# Patient Record
Sex: Female | Born: 1960
Health system: Southern US, Community
[De-identification: ages and names within clinical notes are randomized; demographics above are authoritative.]

## PROBLEM LIST (undated history)

## (undated) ENCOUNTER — Emergency Department (HOSPITAL_BASED_OUTPATIENT_CLINIC_OR_DEPARTMENT_OTHER): Admission: EM | Payer: Medicare Other

## (undated) DIAGNOSIS — F32A Depression, unspecified: Secondary | ICD-10-CM

## (undated) DIAGNOSIS — IMO0002 Reserved for concepts with insufficient information to code with codable children: Secondary | ICD-10-CM

## (undated) DIAGNOSIS — F419 Anxiety disorder, unspecified: Secondary | ICD-10-CM

## (undated) DIAGNOSIS — T4145XA Adverse effect of unspecified anesthetic, initial encounter: Secondary | ICD-10-CM

## (undated) DIAGNOSIS — I493 Ventricular premature depolarization: Secondary | ICD-10-CM

## (undated) DIAGNOSIS — E785 Hyperlipidemia, unspecified: Secondary | ICD-10-CM

## (undated) DIAGNOSIS — M797 Fibromyalgia: Secondary | ICD-10-CM

## (undated) DIAGNOSIS — G43709 Chronic migraine without aura, not intractable, without status migrainosus: Secondary | ICD-10-CM

## (undated) DIAGNOSIS — F329 Major depressive disorder, single episode, unspecified: Secondary | ICD-10-CM

## (undated) DIAGNOSIS — K559 Vascular disorder of intestine, unspecified: Secondary | ICD-10-CM

## (undated) DIAGNOSIS — T8859XA Other complications of anesthesia, initial encounter: Secondary | ICD-10-CM

## (undated) DIAGNOSIS — I10 Essential (primary) hypertension: Secondary | ICD-10-CM

## (undated) DIAGNOSIS — H04129 Dry eye syndrome of unspecified lacrimal gland: Secondary | ICD-10-CM

## (undated) DIAGNOSIS — E039 Hypothyroidism, unspecified: Secondary | ICD-10-CM

## (undated) DIAGNOSIS — F319 Bipolar disorder, unspecified: Secondary | ICD-10-CM

## (undated) DIAGNOSIS — R682 Dry mouth, unspecified: Secondary | ICD-10-CM

## (undated) DIAGNOSIS — Z8489 Family history of other specified conditions: Secondary | ICD-10-CM

## (undated) DIAGNOSIS — K589 Irritable bowel syndrome without diarrhea: Secondary | ICD-10-CM

## (undated) DIAGNOSIS — K219 Gastro-esophageal reflux disease without esophagitis: Secondary | ICD-10-CM

## (undated) DIAGNOSIS — K5790 Diverticulosis of intestine, part unspecified, without perforation or abscess without bleeding: Secondary | ICD-10-CM

## (undated) HISTORY — DX: Depression, unspecified: F32.A

## (undated) HISTORY — PX: ABDOMINAL HYSTERECTOMY: SHX81

## (undated) HISTORY — PX: APPENDECTOMY: SHX54

## (undated) HISTORY — DX: Essential (primary) hypertension: I10

## (undated) HISTORY — DX: Gastro-esophageal reflux disease without esophagitis: K21.9

## (undated) HISTORY — DX: Ventricular premature depolarization: I49.3

## (undated) HISTORY — DX: Fibromyalgia: M79.7

## (undated) HISTORY — DX: Irritable bowel syndrome, unspecified: K58.9

## (undated) HISTORY — PX: TONSILLECTOMY: SUR1361

## (undated) HISTORY — PX: BACK SURGERY: SHX140

## (undated) HISTORY — DX: Dry eye syndrome of unspecified lacrimal gland: H04.129

## (undated) HISTORY — DX: Anxiety disorder, unspecified: F41.9

## (undated) HISTORY — DX: Reserved for concepts with insufficient information to code with codable children: IMO0002

## (undated) HISTORY — DX: Major depressive disorder, single episode, unspecified: F32.9

## (undated) HISTORY — PX: CHOLECYSTECTOMY: SHX55

## (undated) HISTORY — DX: Hyperlipidemia, unspecified: E78.5

## (undated) HISTORY — DX: Vascular disorder of intestine, unspecified: K55.9

## (undated) HISTORY — DX: Dry mouth, unspecified: R68.2

## (undated) HISTORY — DX: Bipolar disorder, unspecified: F31.9

## (undated) HISTORY — DX: Diverticulosis of intestine, part unspecified, without perforation or abscess without bleeding: K57.90

## (undated) HISTORY — DX: Chronic migraine without aura, not intractable, without status migrainosus: G43.709

## (undated) HISTORY — DX: Hypothyroidism, unspecified: E03.9

---
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
## 2000-08-26 ENCOUNTER — Other Ambulatory Visit: Admission: RE | Admit: 2000-08-26 | Discharge: 2000-08-26 | Payer: Self-pay | Admitting: Obstetrics and Gynecology

## 2001-06-26 ENCOUNTER — Encounter: Payer: Self-pay | Admitting: Neurosurgery

## 2001-06-26 ENCOUNTER — Ambulatory Visit (HOSPITAL_COMMUNITY): Admission: RE | Admit: 2001-06-26 | Discharge: 2001-06-26 | Payer: Self-pay | Admitting: Neurosurgery

## 2001-07-15 ENCOUNTER — Encounter: Payer: Self-pay | Admitting: Neurosurgery

## 2001-07-17 ENCOUNTER — Encounter: Payer: Self-pay | Admitting: Neurosurgery

## 2001-07-17 ENCOUNTER — Ambulatory Visit (HOSPITAL_COMMUNITY): Admission: RE | Admit: 2001-07-17 | Discharge: 2001-07-17 | Payer: Self-pay | Admitting: Neurosurgery

## 2004-04-20 ENCOUNTER — Ambulatory Visit: Payer: Self-pay | Admitting: Family Medicine

## 2004-05-16 ENCOUNTER — Ambulatory Visit: Payer: Self-pay | Admitting: Family Medicine

## 2004-06-02 ENCOUNTER — Ambulatory Visit: Payer: Self-pay | Admitting: Family Medicine

## 2004-07-14 ENCOUNTER — Ambulatory Visit: Payer: Self-pay | Admitting: Family Medicine

## 2004-07-15 ENCOUNTER — Encounter: Admission: RE | Admit: 2004-07-15 | Discharge: 2004-07-15 | Payer: Self-pay | Admitting: Rheumatology

## 2004-07-21 ENCOUNTER — Ambulatory Visit: Payer: Self-pay | Admitting: Family Medicine

## 2004-08-21 ENCOUNTER — Ambulatory Visit: Payer: Self-pay | Admitting: Family Medicine

## 2004-10-02 ENCOUNTER — Ambulatory Visit: Payer: Self-pay | Admitting: Family Medicine

## 2004-10-12 ENCOUNTER — Ambulatory Visit: Payer: Self-pay | Admitting: Family Medicine

## 2004-10-12 ENCOUNTER — Encounter: Admission: RE | Admit: 2004-10-12 | Discharge: 2004-10-12 | Payer: Self-pay | Admitting: Family Medicine

## 2004-11-03 ENCOUNTER — Ambulatory Visit: Payer: Self-pay | Admitting: Internal Medicine

## 2004-11-07 ENCOUNTER — Ambulatory Visit: Payer: Self-pay | Admitting: Family Medicine

## 2004-12-20 ENCOUNTER — Ambulatory Visit: Payer: Self-pay

## 2004-12-25 ENCOUNTER — Other Ambulatory Visit: Admission: RE | Admit: 2004-12-25 | Discharge: 2004-12-25 | Payer: Self-pay | Admitting: Obstetrics and Gynecology

## 2005-01-04 ENCOUNTER — Encounter: Admission: RE | Admit: 2005-01-04 | Discharge: 2005-01-04 | Payer: Self-pay | Admitting: Obstetrics and Gynecology

## 2005-01-29 ENCOUNTER — Ambulatory Visit: Payer: Self-pay | Admitting: Family Medicine

## 2005-02-15 ENCOUNTER — Ambulatory Visit: Payer: Self-pay | Admitting: Family Medicine

## 2005-02-20 ENCOUNTER — Ambulatory Visit: Payer: Self-pay | Admitting: Family Medicine

## 2005-03-30 ENCOUNTER — Ambulatory Visit: Payer: Self-pay | Admitting: Family Medicine

## 2005-04-17 ENCOUNTER — Encounter: Admission: RE | Admit: 2005-04-17 | Discharge: 2005-04-17 | Payer: Self-pay | Admitting: Family Medicine

## 2005-05-09 ENCOUNTER — Ambulatory Visit: Payer: Self-pay | Admitting: Family Medicine

## 2005-05-09 ENCOUNTER — Encounter: Admission: RE | Admit: 2005-05-09 | Discharge: 2005-05-09 | Payer: Self-pay | Admitting: Family Medicine

## 2005-06-20 ENCOUNTER — Ambulatory Visit: Payer: Self-pay | Admitting: Family Medicine

## 2005-08-06 ENCOUNTER — Encounter: Admission: RE | Admit: 2005-08-06 | Discharge: 2005-08-06 | Payer: Self-pay | Admitting: Obstetrics and Gynecology

## 2005-09-13 ENCOUNTER — Ambulatory Visit: Payer: Self-pay | Admitting: Family Medicine

## 2005-10-16 ENCOUNTER — Ambulatory Visit: Payer: Self-pay | Admitting: Family Medicine

## 2005-10-17 ENCOUNTER — Ambulatory Visit: Payer: Self-pay | Admitting: Cardiology

## 2005-10-17 ENCOUNTER — Encounter (INDEPENDENT_AMBULATORY_CARE_PROVIDER_SITE_OTHER): Payer: Self-pay | Admitting: *Deleted

## 2005-10-22 ENCOUNTER — Ambulatory Visit: Payer: Self-pay | Admitting: Internal Medicine

## 2005-10-23 ENCOUNTER — Ambulatory Visit: Payer: Self-pay | Admitting: Internal Medicine

## 2005-10-23 ENCOUNTER — Encounter (INDEPENDENT_AMBULATORY_CARE_PROVIDER_SITE_OTHER): Payer: Self-pay | Admitting: *Deleted

## 2005-10-23 ENCOUNTER — Inpatient Hospital Stay (HOSPITAL_COMMUNITY): Admission: EM | Admit: 2005-10-23 | Discharge: 2005-10-24 | Payer: Self-pay | Admitting: Emergency Medicine

## 2005-10-25 ENCOUNTER — Ambulatory Visit: Payer: Self-pay | Admitting: Internal Medicine

## 2005-10-31 ENCOUNTER — Ambulatory Visit (HOSPITAL_COMMUNITY): Admission: RE | Admit: 2005-10-31 | Discharge: 2005-10-31 | Payer: Self-pay | Admitting: Internal Medicine

## 2005-10-31 ENCOUNTER — Encounter: Payer: Self-pay | Admitting: Internal Medicine

## 2005-11-21 ENCOUNTER — Ambulatory Visit: Payer: Self-pay | Admitting: Family Medicine

## 2005-12-10 ENCOUNTER — Ambulatory Visit: Payer: Self-pay | Admitting: Internal Medicine

## 2005-12-26 ENCOUNTER — Ambulatory Visit: Payer: Self-pay | Admitting: Family Medicine

## 2006-04-22 ENCOUNTER — Encounter: Admission: RE | Admit: 2006-04-22 | Discharge: 2006-04-22 | Payer: Self-pay | Admitting: Obstetrics and Gynecology

## 2006-04-30 ENCOUNTER — Ambulatory Visit: Payer: Self-pay | Admitting: Family Medicine

## 2006-05-10 ENCOUNTER — Ambulatory Visit: Payer: Self-pay | Admitting: Family Medicine

## 2006-07-29 DIAGNOSIS — Z87442 Personal history of urinary calculi: Secondary | ICD-10-CM

## 2006-07-29 DIAGNOSIS — F329 Major depressive disorder, single episode, unspecified: Secondary | ICD-10-CM

## 2006-07-29 DIAGNOSIS — L719 Rosacea, unspecified: Secondary | ICD-10-CM | POA: Insufficient documentation

## 2006-07-29 DIAGNOSIS — Z8719 Personal history of other diseases of the digestive system: Secondary | ICD-10-CM | POA: Insufficient documentation

## 2006-07-29 DIAGNOSIS — IMO0001 Reserved for inherently not codable concepts without codable children: Secondary | ICD-10-CM

## 2006-07-29 DIAGNOSIS — R519 Headache, unspecified: Secondary | ICD-10-CM | POA: Insufficient documentation

## 2006-07-29 DIAGNOSIS — E039 Hypothyroidism, unspecified: Secondary | ICD-10-CM | POA: Insufficient documentation

## 2006-07-29 DIAGNOSIS — F3160 Bipolar disorder, current episode mixed, unspecified: Secondary | ICD-10-CM | POA: Insufficient documentation

## 2006-07-29 DIAGNOSIS — R51 Headache: Secondary | ICD-10-CM

## 2006-11-08 ENCOUNTER — Ambulatory Visit: Payer: Self-pay | Admitting: Family Medicine

## 2006-11-08 LAB — CONVERTED CEMR LAB
AST: 40 units/L — ABNORMAL HIGH (ref 0–37)
Basophils Absolute: 0 10*3/uL (ref 0.0–0.1)
Bilirubin, Direct: 0.1 mg/dL (ref 0.0–0.3)
Chloride: 101 meq/L (ref 96–112)
Direct LDL: 179 mg/dL
Eosinophils Absolute: 0.2 10*3/uL (ref 0.0–0.6)
Eosinophils Relative: 2.8 % (ref 0.0–5.0)
GFR calc non Af Amer: 141 mL/min
Glucose, Bld: 92 mg/dL (ref 70–99)
HCT: 39.6 % (ref 36.0–46.0)
Hemoglobin: 13.9 g/dL (ref 12.0–15.0)
Lymphocytes Relative: 32.3 % (ref 12.0–46.0)
MCHC: 35.1 g/dL (ref 30.0–36.0)
MCV: 85.1 fL (ref 78.0–100.0)
Monocytes Absolute: 0.4 10*3/uL (ref 0.2–0.7)
Neutro Abs: 4 10*3/uL (ref 1.4–7.7)
Neutrophils Relative %: 58.5 % (ref 43.0–77.0)
Potassium: 3.4 meq/L — ABNORMAL LOW (ref 3.5–5.1)
Sodium: 142 meq/L (ref 135–145)
TSH: 1.08 microintl units/mL (ref 0.35–5.50)
WBC: 6.8 10*3/uL (ref 4.5–10.5)

## 2006-11-15 ENCOUNTER — Encounter: Payer: Self-pay | Admitting: Family Medicine

## 2006-11-15 ENCOUNTER — Ambulatory Visit: Payer: Self-pay | Admitting: Internal Medicine

## 2007-01-17 ENCOUNTER — Telehealth (INDEPENDENT_AMBULATORY_CARE_PROVIDER_SITE_OTHER): Payer: Self-pay | Admitting: *Deleted

## 2007-01-20 ENCOUNTER — Ambulatory Visit: Payer: Self-pay | Admitting: Family Medicine

## 2007-02-03 ENCOUNTER — Ambulatory Visit: Payer: Self-pay | Admitting: Family Medicine

## 2007-02-07 LAB — CONVERTED CEMR LAB
BUN: 11 mg/dL
CO2: 29 meq/L
Calcium: 9.5 mg/dL
Chloride: 103 meq/L
Creatinine, Ser: 0.6 mg/dL
GFR calc Af Amer: 138 mL/min
GFR calc non Af Amer: 114 mL/min
Glucose, Bld: 106 mg/dL — ABNORMAL HIGH
Potassium: 3.8 meq/L
Sodium: 140 meq/L

## 2007-02-13 ENCOUNTER — Telehealth (INDEPENDENT_AMBULATORY_CARE_PROVIDER_SITE_OTHER): Payer: Self-pay | Admitting: *Deleted

## 2007-04-18 ENCOUNTER — Encounter: Payer: Self-pay | Admitting: Family Medicine

## 2007-04-25 ENCOUNTER — Encounter: Admission: RE | Admit: 2007-04-25 | Discharge: 2007-04-25 | Payer: Self-pay | Admitting: Obstetrics and Gynecology

## 2007-05-30 ENCOUNTER — Ambulatory Visit: Payer: Self-pay | Admitting: Internal Medicine

## 2007-05-30 DIAGNOSIS — I10 Essential (primary) hypertension: Secondary | ICD-10-CM | POA: Insufficient documentation

## 2007-07-08 ENCOUNTER — Ambulatory Visit: Payer: Self-pay | Admitting: Family Medicine

## 2007-07-08 DIAGNOSIS — R011 Cardiac murmur, unspecified: Secondary | ICD-10-CM

## 2007-07-21 ENCOUNTER — Ambulatory Visit: Payer: Self-pay

## 2007-07-21 ENCOUNTER — Encounter: Payer: Self-pay | Admitting: Family Medicine

## 2007-07-22 ENCOUNTER — Telehealth (INDEPENDENT_AMBULATORY_CARE_PROVIDER_SITE_OTHER): Payer: Self-pay | Admitting: *Deleted

## 2007-08-15 ENCOUNTER — Ambulatory Visit: Payer: Self-pay | Admitting: Family Medicine

## 2007-08-15 LAB — CONVERTED CEMR LAB
BUN: 11 mg/dL (ref 6–23)
Chloride: 104 meq/L (ref 96–112)
GFR calc non Af Amer: 113 mL/min
Hgb A1c MFr Bld: 5.4 % (ref 4.6–6.0)
Potassium: 4.4 meq/L (ref 3.5–5.1)
Sodium: 140 meq/L (ref 135–145)

## 2007-09-02 ENCOUNTER — Encounter: Payer: Self-pay | Admitting: Family Medicine

## 2007-09-29 ENCOUNTER — Telehealth (INDEPENDENT_AMBULATORY_CARE_PROVIDER_SITE_OTHER): Payer: Self-pay | Admitting: *Deleted

## 2007-12-31 ENCOUNTER — Telehealth: Payer: Self-pay | Admitting: Internal Medicine

## 2008-02-06 ENCOUNTER — Ambulatory Visit: Payer: Self-pay | Admitting: Family Medicine

## 2008-02-06 DIAGNOSIS — M545 Low back pain: Secondary | ICD-10-CM

## 2008-02-10 DIAGNOSIS — K5730 Diverticulosis of large intestine without perforation or abscess without bleeding: Secondary | ICD-10-CM | POA: Insufficient documentation

## 2008-02-10 DIAGNOSIS — N809 Endometriosis, unspecified: Secondary | ICD-10-CM | POA: Insufficient documentation

## 2008-02-10 DIAGNOSIS — I4949 Other premature depolarization: Secondary | ICD-10-CM | POA: Insufficient documentation

## 2008-02-10 DIAGNOSIS — K219 Gastro-esophageal reflux disease without esophagitis: Secondary | ICD-10-CM | POA: Insufficient documentation

## 2008-02-11 ENCOUNTER — Ambulatory Visit: Payer: Self-pay | Admitting: Internal Medicine

## 2008-02-14 ENCOUNTER — Encounter: Admission: RE | Admit: 2008-02-14 | Discharge: 2008-02-14 | Payer: Self-pay | Admitting: Family Medicine

## 2008-02-18 ENCOUNTER — Telehealth: Payer: Self-pay | Admitting: Family Medicine

## 2008-02-18 ENCOUNTER — Telehealth (INDEPENDENT_AMBULATORY_CARE_PROVIDER_SITE_OTHER): Payer: Self-pay | Admitting: *Deleted

## 2008-02-18 ENCOUNTER — Encounter (INDEPENDENT_AMBULATORY_CARE_PROVIDER_SITE_OTHER): Payer: Self-pay | Admitting: *Deleted

## 2008-03-19 ENCOUNTER — Encounter (INDEPENDENT_AMBULATORY_CARE_PROVIDER_SITE_OTHER): Payer: Self-pay | Admitting: *Deleted

## 2008-03-31 ENCOUNTER — Encounter: Payer: Self-pay | Admitting: Family Medicine

## 2008-04-08 ENCOUNTER — Emergency Department (HOSPITAL_BASED_OUTPATIENT_CLINIC_OR_DEPARTMENT_OTHER): Admission: EM | Admit: 2008-04-08 | Discharge: 2008-04-08 | Payer: Self-pay | Admitting: Emergency Medicine

## 2008-05-21 ENCOUNTER — Encounter: Payer: Self-pay | Admitting: Family Medicine

## 2008-05-25 ENCOUNTER — Encounter: Admission: RE | Admit: 2008-05-25 | Discharge: 2008-05-25 | Payer: Self-pay | Admitting: Obstetrics and Gynecology

## 2008-06-03 ENCOUNTER — Encounter: Admission: RE | Admit: 2008-06-03 | Discharge: 2008-06-03 | Payer: Self-pay | Admitting: Obstetrics and Gynecology

## 2008-08-17 ENCOUNTER — Encounter: Payer: Self-pay | Admitting: Family Medicine

## 2008-08-23 ENCOUNTER — Telehealth (INDEPENDENT_AMBULATORY_CARE_PROVIDER_SITE_OTHER): Payer: Self-pay | Admitting: *Deleted

## 2008-12-27 ENCOUNTER — Ambulatory Visit: Payer: Self-pay | Admitting: Family Medicine

## 2008-12-27 DIAGNOSIS — J029 Acute pharyngitis, unspecified: Secondary | ICD-10-CM | POA: Insufficient documentation

## 2008-12-28 ENCOUNTER — Encounter: Payer: Self-pay | Admitting: Family Medicine

## 2009-01-03 ENCOUNTER — Telehealth (INDEPENDENT_AMBULATORY_CARE_PROVIDER_SITE_OTHER): Payer: Self-pay | Admitting: *Deleted

## 2009-02-07 ENCOUNTER — Ambulatory Visit: Payer: Self-pay | Admitting: Family Medicine

## 2009-02-07 DIAGNOSIS — D239 Other benign neoplasm of skin, unspecified: Secondary | ICD-10-CM | POA: Insufficient documentation

## 2009-02-16 ENCOUNTER — Telehealth: Payer: Self-pay | Admitting: Family Medicine

## 2009-02-17 ENCOUNTER — Encounter (INDEPENDENT_AMBULATORY_CARE_PROVIDER_SITE_OTHER): Payer: Self-pay | Admitting: *Deleted

## 2009-02-22 ENCOUNTER — Ambulatory Visit: Payer: Self-pay | Admitting: Internal Medicine

## 2009-02-22 ENCOUNTER — Encounter: Payer: Self-pay | Admitting: Family Medicine

## 2009-03-02 ENCOUNTER — Ambulatory Visit: Payer: Self-pay | Admitting: Internal Medicine

## 2009-03-15 ENCOUNTER — Encounter: Payer: Self-pay | Admitting: Family Medicine

## 2009-04-19 ENCOUNTER — Encounter: Payer: Self-pay | Admitting: Family Medicine

## 2009-04-23 ENCOUNTER — Ambulatory Visit: Payer: Self-pay | Admitting: Family Medicine

## 2009-04-23 DIAGNOSIS — N390 Urinary tract infection, site not specified: Secondary | ICD-10-CM | POA: Insufficient documentation

## 2009-04-23 LAB — CONVERTED CEMR LAB
Nitrite: NEGATIVE
Specific Gravity, Urine: 1.015
Urobilinogen, UA: 0.2
WBC Urine, dipstick: NEGATIVE

## 2009-05-23 ENCOUNTER — Emergency Department (HOSPITAL_BASED_OUTPATIENT_CLINIC_OR_DEPARTMENT_OTHER): Admission: EM | Admit: 2009-05-23 | Discharge: 2009-05-23 | Payer: Self-pay | Admitting: Emergency Medicine

## 2009-05-23 ENCOUNTER — Encounter (INDEPENDENT_AMBULATORY_CARE_PROVIDER_SITE_OTHER): Payer: Self-pay | Admitting: *Deleted

## 2009-05-23 ENCOUNTER — Ambulatory Visit: Payer: Self-pay | Admitting: Diagnostic Radiology

## 2009-05-24 ENCOUNTER — Telehealth: Payer: Self-pay | Admitting: Family Medicine

## 2009-05-25 ENCOUNTER — Encounter: Payer: Self-pay | Admitting: Family Medicine

## 2009-05-27 ENCOUNTER — Encounter: Admission: RE | Admit: 2009-05-27 | Discharge: 2009-05-27 | Payer: Self-pay | Admitting: Obstetrics and Gynecology

## 2009-07-04 ENCOUNTER — Telehealth: Payer: Self-pay | Admitting: Family Medicine

## 2009-07-07 ENCOUNTER — Ambulatory Visit: Payer: Self-pay | Admitting: Family Medicine

## 2009-07-07 DIAGNOSIS — G43909 Migraine, unspecified, not intractable, without status migrainosus: Secondary | ICD-10-CM | POA: Insufficient documentation

## 2009-07-07 DIAGNOSIS — G43009 Migraine without aura, not intractable, without status migrainosus: Secondary | ICD-10-CM | POA: Insufficient documentation

## 2009-07-25 ENCOUNTER — Telehealth (INDEPENDENT_AMBULATORY_CARE_PROVIDER_SITE_OTHER): Payer: Self-pay | Admitting: *Deleted

## 2009-11-14 ENCOUNTER — Telehealth: Payer: Self-pay | Admitting: Internal Medicine

## 2009-11-22 ENCOUNTER — Encounter (INDEPENDENT_AMBULATORY_CARE_PROVIDER_SITE_OTHER): Payer: Self-pay | Admitting: *Deleted

## 2009-11-24 ENCOUNTER — Ambulatory Visit: Payer: Self-pay | Admitting: Internal Medicine

## 2009-12-09 ENCOUNTER — Ambulatory Visit: Payer: Self-pay | Admitting: Internal Medicine

## 2009-12-09 ENCOUNTER — Ambulatory Visit (HOSPITAL_COMMUNITY): Admission: RE | Admit: 2009-12-09 | Discharge: 2009-12-09 | Payer: Self-pay | Admitting: Internal Medicine

## 2009-12-09 LAB — HM COLONOSCOPY

## 2009-12-13 ENCOUNTER — Encounter: Payer: Self-pay | Admitting: Internal Medicine

## 2010-01-09 ENCOUNTER — Telehealth: Payer: Self-pay | Admitting: Family Medicine

## 2010-02-10 ENCOUNTER — Ambulatory Visit: Payer: Self-pay | Admitting: Family Medicine

## 2010-02-14 ENCOUNTER — Ambulatory Visit: Payer: Self-pay | Admitting: Internal Medicine

## 2010-03-15 ENCOUNTER — Ambulatory Visit: Payer: Self-pay | Admitting: Family Medicine

## 2010-03-15 LAB — CONVERTED CEMR LAB
Blood in Urine, dipstick: NEGATIVE
Ketones, urine, test strip: NEGATIVE
Nitrite: NEGATIVE
Protein, U semiquant: NEGATIVE
Specific Gravity, Urine: 1.015

## 2010-03-16 ENCOUNTER — Encounter: Payer: Self-pay | Admitting: Family Medicine

## 2010-06-27 ENCOUNTER — Encounter
Admission: RE | Admit: 2010-06-27 | Discharge: 2010-06-27 | Payer: Self-pay | Source: Home / Self Care | Attending: Obstetrics and Gynecology | Admitting: Obstetrics and Gynecology

## 2010-07-02 ENCOUNTER — Encounter: Payer: Self-pay | Admitting: Obstetrics and Gynecology

## 2010-07-04 ENCOUNTER — Encounter
Admission: RE | Admit: 2010-07-04 | Discharge: 2010-07-04 | Payer: Self-pay | Source: Home / Self Care | Attending: Obstetrics and Gynecology | Admitting: Obstetrics and Gynecology

## 2010-07-09 LAB — CONVERTED CEMR LAB
ALT: 24 units/L (ref 0–35)
AST: 24 units/L (ref 0–37)
Albumin: 3.8 g/dL (ref 3.5–5.2)
Albumin: 4 g/dL (ref 3.5–5.2)
Albumin: 4.3 g/dL (ref 3.5–5.2)
Alkaline Phosphatase: 78 units/L (ref 39–117)
Alkaline Phosphatase: 87 units/L (ref 39–117)
BUN: 11 mg/dL (ref 6–23)
BUN: 13 mg/dL (ref 6–23)
Basophils Absolute: 0.1 10*3/uL (ref 0.0–0.1)
Basophils Relative: 0.3 % (ref 0.0–3.0)
Basophils Relative: 0.9 % (ref 0.0–3.0)
Bilirubin, Direct: 0.1 mg/dL (ref 0.0–0.3)
Blood in Urine, dipstick: NEGATIVE
CO2: 29 meq/L (ref 19–32)
CO2: 30 meq/L (ref 19–32)
Calcium: 8.9 mg/dL (ref 8.4–10.5)
Calcium: 9.7 mg/dL (ref 8.4–10.5)
Chloride: 107 meq/L (ref 96–112)
Creatinine, Ser: 0.6 mg/dL (ref 0.4–1.2)
Direct LDL: 185.8 mg/dL
Eosinophils Relative: 2.8 % (ref 0.0–5.0)
Eosinophils Relative: 4 % (ref 0.0–5.0)
GFR calc Af Amer: 115 mL/min
GFR calc non Af Amer: 105.92 mL/min (ref >60)
Glucose, Bld: 93 mg/dL (ref 70–99)
Glucose, Bld: 96 mg/dL (ref 70–99)
Glucose, Urine, Semiquant: NEGATIVE
Glucose, Urine, Semiquant: NEGATIVE
HCT: 40.7 % (ref 36.0–46.0)
HCT: 41.5 % (ref 36.0–46.0)
HDL: 40.8 mg/dL (ref >39.00)
Hemoglobin: 13.3 g/dL (ref 12.0–15.0)
Hemoglobin: 14.6 g/dL (ref 12.0–15.0)
Lymphocytes Relative: 30.3 % (ref 12.0–46.0)
Lymphs Abs: 2.3 10*3/uL (ref 0.7–4.0)
MCHC: 34.4 g/dL (ref 30.0–36.0)
MCHC: 34.8 g/dL (ref 30.0–36.0)
MCV: 85.4 fL (ref 78.0–100.0)
Monocytes Absolute: 0.4 10*3/uL (ref 0.1–1.0)
Monocytes Absolute: 0.4 10*3/uL (ref 0.1–1.0)
Monocytes Relative: 5.2 % (ref 3.0–12.0)
Monocytes Relative: 6.7 % (ref 3.0–12.0)
Neutro Abs: 3.9 10*3/uL (ref 1.4–7.7)
Neutro Abs: 5 10*3/uL (ref 1.4–7.7)
Neutrophils Relative %: 59.9 % (ref 43.0–77.0)
Nitrite: NEGATIVE
Platelets: 316 10*3/uL (ref 150.0–400.0)
Potassium: 4 meq/L (ref 3.5–5.1)
Potassium: 4.2 meq/L (ref 3.5–5.1)
RBC: 4.49 M/uL (ref 3.87–5.11)
RBC: 4.87 M/uL (ref 3.87–5.11)
RDW: 12.1 % (ref 11.5–14.6)
Sodium: 137 meq/L (ref 135–145)
Specific Gravity, Urine: 1.01
TSH: 0.75 microintl units/mL (ref 0.35–5.50)
Total Bilirubin: 0.5 mg/dL (ref 0.3–1.2)
Total CHOL/HDL Ratio: 6
Total CHOL/HDL Ratio: 7.3
Total Protein: 6.8 g/dL (ref 6.0–8.3)
Total Protein: 7.7 g/dL (ref 6.0–8.3)
Triglycerides: 313 mg/dL — ABNORMAL HIGH (ref 0.0–149.0)
VLDL: 62.6 mg/dL — ABNORMAL HIGH (ref 0.0–40.0)
Vit D, 25-Hydroxy: 17 ng/mL — ABNORMAL LOW (ref 30–89)
Vit D, 25-Hydroxy: 23 ng/mL — ABNORMAL LOW (ref 30–89)
Vitamin B-12: 544 pg/mL (ref 211–911)
WBC Urine, dipstick: NEGATIVE
WBC Urine, dipstick: NEGATIVE
WBC: 6.5 10*3/uL (ref 4.5–10.5)
WBC: 8.1 10*3/uL (ref 4.5–10.5)
pH: 5

## 2010-07-10 ENCOUNTER — Telehealth: Payer: Self-pay | Admitting: Family Medicine

## 2010-07-11 NOTE — Assessment & Plan Note (Signed)
Summary: DISCUSS MEDS/[facility]   Vital Signs:  Patient profile:   50 year old female Weight:      227 pounds Temp:     99.0 degrees F oral Pulse rate:   92 / minute Pulse rhythm:   regular BP sitting:   120 / 82  (left arm) Cuff size:   large  Vitals Entered By: Army Fossa CMA (July 07, 2009 11:28 AM) CC: discuss meds. quit making midrin. , Headache, Headaches   History of Present Illness:  Headaches      This is a 50 year old woman who presents with Headaches.  The patient complains of photophobia and phonophobia, but denies nausea, vomiting, sweats, tearing of eyes, nasal congestion, sinus pain, and sinus pressure.  The headache is described as intermittent.  The patient denies the following high-risk features: fever, neck pain/stiffness, vision loss or change, focal weakness, altered mental status, rash, trauma, pain worse with exertion, new type of headache, age >50 years, immunosuppression, concomitant infection, and anticoagulation use.  Prior treatment has included a NSAID, a narcotic, a triptan, a muscle relaxer, a tricyclic antidepressant, a beta blocker, and an anti-epileptic medication.    Headache HPI:      Headache quality is intermittent.        Current Medications (verified): 1)  Synthroid 175 Mcg  Tabs (Levothyroxine Sodium) .Marland Kitchen.. 1 By Mouth Once Daily M-Thursday 2)  Synthroid 200 Mcg  Tabs (Levothyroxine Sodium) .Marland Kitchen.. 1 By Mouth Fri,sat,sun 3)  Klonopin 1 Mg Tabs (Clonazepam) .... Take One Tablet Three Times A Day 4)  Clinoril 200 Mg  Tabs (Sulindac) .Marland Kitchen.. 1 By Mouth Am, 1 By Mouth Pm 5)  Flexeril 10 Mg  Tabs (Cyclobenzaprine Hcl) .Marland Kitchen.. 1 Once Daily 6)  Lamictal 25 Mg  Tbdp (Lamotrigine) .Marland Kitchen.. 1 By Mouth Am, 1 By Mouth Pm 7)  Fentanyl 75 Mcg/hr  Pt72 (Fentanyl) .Marland Kitchen.. 1 Patch Every Other Day 8)  Promethazine Hcl 25 Mg  Tabs (Promethazine Hcl) .... As Needed Migraines 9)  Vicodin Hp 10-660 Mg  Tabs (Hydrocodone-Acetaminophen) .... As Needed 10)  Centrum  Tabs (Multiple  Vitamins-Minerals) .... Once Daily 11)  Ester C .... Once Daily 12)  Calcium and Vit D .... Twice Daily 13)  Librax 2.5-5 Mg  Caps (Clidinium-Chlordiazepoxide) .... Take One Tablet By Mouth Three Times Daily,before Meals,as Needed. 14)  Pristiq 100 Mg Xr24h-Tab (Desvenlafaxine Succinate) .... Take 1 Tab Once Daily 15)  Lisinopril 20 Mg Tabs (Lisinopril) .Marland Kitchen.. 1 By Mouth Daily 16)  Vitamin D (Ergocalciferol) 50000 Unit Caps (Ergocalciferol) .... Take 1 Tab Weekly 17)  Vitamin D3 2000 Unit Caps (Cholecalciferol) .... Once Daily 18)  Vitamin E 400 Unit Caps (Vitamin E) .... Once Daily 19)  Prodrin 500-130-20 Mg Tabs (Apap-Isometheptene-Caffeine) .Marland Kitchen.. 1-2 By Mouth Q 6 Hours As Needed  Allergies (verified): 1)  ! Pcn 2)  ! Sulfa 3)  ! * Topamax 4)  ! * Oral Steroids 5)  ! * Donnatal 6)  ! Phenobarbital 7)  ! Toradol 8)  ! * Sonata  Past History:  Past medical, surgical, family and social histories (including risk factors) reviewed for relevance to current acute and chronic problems.  Past Medical History: Reviewed history from 02/10/2008 and no changes required. Depression Hypothyroidism Headache Hx of PREMATURE VENTRICULAR CONTRACTIONS (ICD-427.69) GERD (ICD-530.81) ISCHEMIC COLITIS, HX OF (ICD-V12.79) Hx of ENDOMETRIOSIS, SITE UNSPECIFIED (ICD-617.9) PREVENTIVE HEALTH CARE (ICD-V70.0) FAMILY HISTORY OF CAD FEMALE 1ST DEGREE RELATIVE <50 (ICD-V17.3) FAMILY HISTORY OSTEOPOROSIS (ICD-V17.8) BACK PAIN, LUMBAR (ICD-724.2) MURMUR (ICD-785.2)  HYPERTENSION, ESSENTIAL NOS (ICD-401.9) DIVERTICULOSIS OF COLON (ICD-562.10) HX, PERSONAL, URINARY CALCULI (ICD-V13.01) BIPOLAR I, MIXED, MOST RECENT EPSD NOS (ICD-296.60) ROSACEA (ICD-695.3) FIBROMYALGIA (ICD-729.1) IRRITABLE BOWEL SYNDROME, HX OF (ICD-V12.79) HEADACHE (ICD-784.0) HYPOTHYROIDISM (ICD-244.9) DEPRESSION (ICD-311)  Past Surgical History: Reviewed history from 02/10/2008 and no changes  required. Cholecystectomy Hysterectomy Tonsillectomy Back surgery (07/2001) micro diskectomy Back surgery (08/2001) L facet rhizotomy Appendectomy  Family History: Reviewed history from 02/10/2008 and no changes required. Family History Osteoporosis Family History High cholesterol Family History Hypertension Family History of Arthritis Family History of CAD Female 1st degree relative <50-Father No FH of Colon Cancer: Family History of Colon Polyps: Mother  Social History: Reviewed history from 02/06/2008 and no changes required. disabled Married Never Smoked Alcohol use-no Drug use-no Regular exercise-no  Review of Systems      See HPI Neuro:  Complains of headaches; denies brief paralysis, difficulty with concentration, disturbances in coordination, falling down, inability to speak, memory loss, numbness, poor balance, seizures, sensation of room spinning, tingling, tremors, visual disturbances, and weakness. Psych:  Denies alternate hallucination ( auditory/visual), anxiety, depression, easily angered, easily tearful, irritability, mental problems, panic attacks, sense of great danger, suicidal thoughts/plans, thoughts of violence, unusual visions or sounds, and thoughts /plans of harming others.  Physical Exam  General:  Well-developed,well-nourished,in no acute distress; alert,appropriate and cooperative throughout examination Eyes:  vision grossly intact, pupils equal, and pupils round.   Ears:  External ear exam shows no significant lesions or deformities.  Otoscopic examination reveals clear canals, tympanic membranes are intact bilaterally without bulging, retraction, inflammation or discharge. Hearing is grossly normal bilaterally. Nose:  External nasal examination shows no deformity or inflammation. Nasal mucosa are pink and moist without lesions or exudates. Mouth:  Oral mucosa and oropharynx without lesions or exudates.  Teeth in good repair. Neck:  No deformities,  masses, or tenderness noted. Heart:  Normal rate and regular rhythm. S1 and S2 normal without gallop, murmur, click, rub or other extra sounds. Extremities:  No clubbing, cyanosis, edema, or deformity noted with normal full range of motion of all joints.   Neurologic:  No cranial nerve deficits noted. Station and gait are normal. Plantar reflexes are down-going bilaterally. DTRs are symmetrical throughout. Sensory, motor and coordinative functions appear intact. Skin:  Intact without suspicious lesions or rashes Cervical Nodes:  No lymphadenopathy noted Psych:  Cognition and judgment appear intact. Alert and cooperative with normal attention span and concentration. No apparent delusions, illusions, hallucinations   Impression & Recommendations:  Problem # 1:  COMMON MIGRAINE (ICD-346.10)  The following medications were removed from the medication list:    Midrin 325-65-100 Mg Caps (Apap-isometheptene-dichloral) .Marland Kitchen... As needed migraines Her updated medication list for this problem includes:    Clinoril 200 Mg Tabs (Sulindac) .Marland Kitchen... 1 by mouth am, 1 by mouth pm    Fentanyl 75 Mcg/hr Pt72 (Fentanyl) .Marland Kitchen... 1 patch every other day    Vicodin Hp 10-660 Mg Tabs (Hydrocodone-acetaminophen) .Marland Kitchen... As needed    Prodrin 500-130-20 Mg Tabs (Apap-isometheptene-caffeine) .Marland Kitchen... 1-2 by mouth q 6 hours as needed  Complete Medication List: 1)  Synthroid 175 Mcg Tabs (Levothyroxine sodium) .Marland Kitchen.. 1 by mouth once daily m-thursday 2)  Synthroid 200 Mcg Tabs (Levothyroxine sodium) .Marland Kitchen.. 1 by mouth fri,sat,sun 3)  Klonopin 1 Mg Tabs (Clonazepam) .... Take one tablet three times a day 4)  Clinoril 200 Mg Tabs (Sulindac) .Marland Kitchen.. 1 by mouth am, 1 by mouth pm 5)  Flexeril 10 Mg Tabs (Cyclobenzaprine hcl) .Marland Kitchen.. 1 once daily 6)  Lamictal 25 Mg Tbdp (Lamotrigine) .Marland Kitchen.. 1 by mouth am, 1 by mouth pm 7)  Fentanyl 75 Mcg/hr Pt72 (Fentanyl) .Marland Kitchen.. 1 patch every other day 8)  Promethazine Hcl 25 Mg Tabs (Promethazine hcl) .... As  needed migraines 9)  Vicodin Hp 10-660 Mg Tabs (Hydrocodone-acetaminophen) .... As needed 10)  Centrum Tabs (Multiple vitamins-minerals) .... Once daily 11)  Ester C  .... Once daily 12)  Calcium and Vit D  .... Twice daily 13)  Librax 2.5-5 Mg Caps (Clidinium-chlordiazepoxide) .... Take one tablet by mouth three times daily,before meals,as needed. 14)  Pristiq 100 Mg Xr24h-tab (Desvenlafaxine succinate) .... Take 1 tab once daily 15)  Lisinopril 20 Mg Tabs (Lisinopril) .Marland Kitchen.. 1 by mouth daily 16)  Vitamin D (ergocalciferol) 50000 Unit Caps (Ergocalciferol) .... Take 1 tab weekly 17)  Vitamin D3 2000 Unit Caps (Cholecalciferol) .... Once daily 18)  Vitamin E 400 Unit Caps (Vitamin e) .... Once daily 19)  Prodrin 500-130-20 Mg Tabs (Apap-isometheptene-caffeine) .Marland Kitchen.. 1-2 by mouth q 6 hours as needed Prescriptions: PRODRIN 500-130-20 MG TABS (APAP-ISOMETHEPTENE-CAFFEINE) 1-2 by mouth q 6 hours as needed  #30 x 0   Entered and Authorized by:   Loreen Freud DO   Signed by:   Loreen Freud DO on 07/07/2009   Method used:   Print then Give to Patient   RxID:   [id number]

## 2010-07-11 NOTE — Assessment & Plan Note (Signed)
Summary: FOLLOW UP EGD/COLON...SP    History of Present Illness Visit Type: Follow-up Visit Primary GI MD: Lina Sar MD Primary Provider: Loreen Freud, DO Chief Complaint: follow-up EGD/Colon Miralax not working History of Present Illness:   This is a 50 year old white female with irritable bowel syndrome and functional constipation who is status post colonoscopy on 12/09/09 with normal mucosal biopsies. A prior colonoscopy was completed in 2001 in [place]. She was complaining of severe episodes of abdominal pain and diarrhea. Since the colonoscopy she has been on a high fiber diet and large doses of raisin bran with walnuts which seem to regulate her bowel habits to her satisfaction. She has stopped taking MiraLax and uses only high-fiber cereal. Her bowel movements occur daily. She is very satisfied with her progress. A CT Scan of the abdomen and pelvis in December 2010 showed a right kidney stone with aureteral vesicle junction calculus and right-sided hydronephrosis. There is a personal history of chronic narcotic dependence for back pain. She has been disabled. She had a prior cholecystectomy. Patient has a diagnosis of bipolar disorder and fibromyalgia. She was treated for endometriosis and had ischemic colitis in the past.   GI Review of Systems      Denies abdominal pain, acid reflux, belching, bloating, chest pain, dysphagia with liquids, dysphagia with solids, heartburn, loss of appetite, nausea, vomiting, vomiting blood, weight loss, and  weight gain.        Denies anal fissure, black tarry stools, change in bowel habit, constipation, diarrhea, diverticulosis, fecal incontinence, heme positive stool, hemorrhoids, irritable bowel syndrome, jaundice, light color stool, liver problems, rectal bleeding, and  rectal pain.    Allergies (verified): 1)  ! Pcn 2)  ! Sulfa 3)  ! * Topamax 4)  ! * Oral Steroids 5)  ! * Donnatal 6)  ! Phenobarbital 7)  ! Toradol 8)  ! Kathaleen Bury  Past  History:  Past Medical History: Reviewed history from 02/10/2008 and no changes required. Depression Hypothyroidism Headache Hx of PREMATURE VENTRICULAR CONTRACTIONS (ICD-427.69) GERD (ICD-530.81) ISCHEMIC COLITIS, HX OF (ICD-V12.79) Hx of ENDOMETRIOSIS, SITE UNSPECIFIED (ICD-617.9) PREVENTIVE HEALTH CARE (ICD-V70.0) FAMILY HISTORY OF CAD FEMALE 1ST DEGREE RELATIVE <50 (ICD-V17.3) FAMILY HISTORY OSTEOPOROSIS (ICD-V17.8) BACK PAIN, LUMBAR (ICD-724.2) MURMUR (ICD-785.2) HYPERTENSION, ESSENTIAL NOS (ICD-401.9) DIVERTICULOSIS OF COLON (ICD-562.10) HX, PERSONAL, URINARY CALCULI (ICD-V13.01) BIPOLAR I, MIXED, MOST RECENT EPSD NOS (ICD-296.60) ROSACEA (ICD-695.3) FIBROMYALGIA (ICD-729.1) IRRITABLE BOWEL SYNDROME, HX OF (ICD-V12.79) HEADACHE (ICD-784.0) HYPOTHYROIDISM (ICD-244.9) DEPRESSION (ICD-311)  Past Surgical History: Reviewed history from 02/10/2008 and no changes required. Cholecystectomy Hysterectomy Tonsillectomy Back surgery (07/2001) micro diskectomy Back surgery (08/2001) L facet rhizotomy Appendectomy  Family History: Reviewed history from 02/10/2008 and no changes required. Family History Osteoporosis Family History High cholesterol Family History Hypertension Family History of Arthritis Family History of CAD Female 1st degree relative <50-Father No FH of Colon Cancer: Family History of Colon Polyps: Mother  Social History: Reviewed history from 02/06/2008 and no changes required. disabled Married Never Smoked Alcohol use-no Drug use-no Regular exercise-no  Review of Systems       The patient complains of sleeping problems.  The patient denies allergy/sinus, anemia, anxiety-new, arthritis/joint pain, back pain, blood in urine, breast changes/lumps, change in vision, confusion, cough, coughing up blood, depression-new, fainting, fatigue, fever, headaches-new, hearing problems, heart murmur, heart rhythm changes, itching, menstrual pain, muscle pains/cramps,  night sweats, nosebleeds, pregnancy symptoms, shortness of breath, skin rash, sore throat, swelling of feet/legs, swollen lymph glands, thirst - excessive , urination - excessive ,  urination changes/pain, urine leakage, vision changes, and voice change.         Pertinent positive and negative review of systems were noted in the above HPI. All other ROS was otherwise negative.   Vital Signs:  Patient profile:   50 year old female Height:      65.75 inches Weight:      231 pounds BMI:     37.70 Pulse rate:   104 / minute Pulse rhythm:   regular BP sitting:   130 / 92  (left arm) Cuff size:   large  Vitals Entered By: Milford Cage NCMA (February 14, 2010 1:58 PM)   Impression & Recommendations:  Problem # 1:  GERD (ICD-530.81) currently asymptomatic.  Problem # 2:  ISCHEMIC COLITIS, HX OF (ICD-V12.79) Patient is status post recent normal colonoscopy.  Problem # 3:  DIVERTICULOSIS OF COLON (ICD-562.10) Patient is status post recent normal colonoscopy. She has functional constipation which is well controlled on a high-fiber diet and fiber supplements.  Patient Instructions: 1)  continue high-fiber diet 2)  Continue  bran cereal 2 cups daily as per pt. 3)  Take MiraLax and Dulcolax tablets p.r.n. 4)  Recall colonoscopy July 2021 5)  Copy sent to : DR Y.Lowne Prescriptions: LIBRAX 2.5-5 MG  CAPS (CLIDINIUM-CHLORDIAZEPOXIDE) Take one tablet by mouth three times daily,before meals,as needed.  #90 x 1   Entered by:   Lamona Curl CMA (AAMA)   Authorized by:   Hart Carwin MD   Signed by:   Lamona Curl CMA (AAMA) on 02/14/2010   Method used:   Printed then faxed to ...       [facility]  [address] [phone]* (retail)       [address]       [place], [place]  [address]       Ph: [phone] or [phone]       Fax: [phone]   RxID:   [id number]

## 2010-07-11 NOTE — Procedures (Signed)
Summary: [facility]SP form/[facility]  [facility]P form/[facility]   Imported By: Lester Purdy 11/28/2009 09:51:52  _____________________________________________________________________  External Attachment:    Type:   Image     Comment:   External Document

## 2010-07-11 NOTE — Letter (Signed)
Summary: Patient Notice- Polyp Results  [facility]  [address] [place], [place] [address]   Phone: [phone]  Fax: [phone]        December 13, 2009 MRN: [id number]    Karen Brown [address] [place], [place]  [address]    Dear Ms. Mohamed,  I am pleased to inform you that the colon polyp(s) removed during your recent colonoscopy was (were) found to be benign (no cancer detected) upon pathologic examination.  I recommend you have a repeat colonoscopy examination in 10_ years to look for recurrent polyps, as having colon polyps increases your risk for having recurrent polyps or even colon cancer in the future.  Should you develop new or worsening symptoms of abdominal pain, bowel habit changes or bleeding from the rectum or bowels, please schedule an evaluation with either your primary care physician or with me.  Additional information/recommendations:  _x_ No further action with gastroenterology is needed at this time. Please      follow-up with your primary care physician for your other healthcare      needs.  __ Please call [phone] to schedule a return visit to review your      situation.  __ Please keep your follow-up visit as already scheduled.  __ Continue treatment plan as outlined the day of your exam.  Please call us if you are having persistent problems or have questions about your condition that have not been fully answered at this time.  Sincerely,  Hart Carwin MD  This letter has been electronically signed by your physician.  Appended Document: Patient Notice- Polyp Results Recall is in [facility] for 12/2019. Letter mailed to patient.

## 2010-07-11 NOTE — Assessment & Plan Note (Signed)
Summary: Karen Brown will be fasting//lch   Vital Signs:  Patient profile:   50 year old female Height:      65.75 inches Weight:      231.2 pounds BMI:     37.74 Temp:     98.7 degrees F Pulse rate:   80 / minute Pulse rhythm:   regular BP sitting:   130 / 78  (right arm)  Vitals Entered By: Almeta Monas CMA Duncan Dull) (February 10, 2010 8:58 AM) CC: CPX/FASTING no pap needed   History of Present Illness: Pt here for cpe --no pap.  Pt sees gyn.  No new complaints.    Preventive Screening-Counseling & Management  Alcohol-Tobacco     Alcohol drinks/day: 0     Smoking Status: never     Passive Smoke Exposure: no  Caffeine-Diet-Exercise     Caffeine use/day: 0     Does Patient Exercise: yes     Type of exercise: walking     Exercise (avg: min/session): <30     Times/week: <3     Exercise Counseling: to improve exercise regimen  Hep-HIV-STD-Contraception     HIV Risk: no     Dental Visit-last 6 months yes     Dental Care Counseling: not indicated; dental care within six months     SBE monthly: yes     Sun Exposure-Excessive: no  Safety-Violence-Falls     Seat Belt Use: yes      Sexual History:  currently monogamous.    Current Medications (verified): 1)  Synthroid 175 Mcg  Tabs (Levothyroxine Sodium) .Marland Kitchen.. 1 By Mouth Once Daily M-Friday 2)  Synthroid 200 Mcg  Tabs (Levothyroxine Sodium) .Marland Kitchen.. 1 By Mouth Sat,sun 3)  Klonopin 1 Mg Tabs (Clonazepam) .... Take One Tablet Three Times A Day 4)  Clinoril 200 Mg  Tabs (Sulindac) .Marland Kitchen.. 1 By Mouth Am, 1 By Mouth Pm 5)  Flexeril 10 Mg  Tabs (Cyclobenzaprine Hcl) .Marland Kitchen.. 1 Once Daily 6)  Lamictal 25 Mg  Tbdp (Lamotrigine) .Marland Kitchen.. 1 By Mouth Am, 1 By Mouth Pm 7)  Fentanyl 75 Mcg/hr  Pt72 (Fentanyl) .Marland Kitchen.. 1 Patch Every Other Day 8)  Promethazine Hcl 25 Mg  Tabs (Promethazine Hcl) .... As Needed Migraines 9)  Vicodin Hp 10-660 Mg  Tabs (Hydrocodone-Acetaminophen) .... As Needed 10)  Centrum  Tabs (Multiple Vitamins-Minerals) .... Once  Daily 11)  Ester C .... Once Daily 12)  Calcium and Vit D .... Twice Daily 13)  Librax 2.5-5 Mg  Caps (Clidinium-Chlordiazepoxide) .... Take One Tablet By Mouth Three Times Daily,before Meals,as Needed. 14)  Pristiq 100 Mg Xr24h-Tab (Desvenlafaxine Succinate) .... Take 1 Tab Once Daily 15)  Lisinopril 20 Mg Tabs (Lisinopril) .Marland Kitchen.. 1 By Mouth Daily 16)  Vitamin D (Ergocalciferol) 50000 Unit Caps (Ergocalciferol) .... Take 1 Tab Weekly 17)  Vitamin D3 2000 Unit Caps (Cholecalciferol) .... Once Daily 18)  Vitamin E 400 Unit Caps (Vitamin E) .... Once Daily 19)  Prodrin 500-130-20 Mg Tabs (Apap-Isometheptene-Caffeine) .Marland Kitchen.. 1-2 By Mouth Q 6 Hours As Needed  Allergies (verified): 1)  ! Pcn 2)  ! Sulfa 3)  ! * Topamax 4)  ! * Oral Steroids 5)  ! * Donnatal 6)  ! Phenobarbital 7)  ! Toradol 8)  ! Kathaleen Bury  Past History:  Past Medical History: Last updated: 02/10/2008 Depression Hypothyroidism Headache Hx of PREMATURE VENTRICULAR CONTRACTIONS (ICD-427.69) GERD (ICD-530.81) ISCHEMIC COLITIS, HX OF (ICD-V12.79) Hx of ENDOMETRIOSIS, SITE UNSPECIFIED (ICD-617.9) PREVENTIVE HEALTH CARE (ICD-V70.0) FAMILY HISTORY OF CAD FEMALE 1ST  DEGREE RELATIVE <50 (ICD-V17.3) FAMILY HISTORY OSTEOPOROSIS (ICD-V17.8) BACK PAIN, LUMBAR (ICD-724.2) MURMUR (ICD-785.2) HYPERTENSION, ESSENTIAL NOS (ICD-401.9) DIVERTICULOSIS OF COLON (ICD-562.10) HX, PERSONAL, URINARY CALCULI (ICD-V13.01) BIPOLAR I, MIXED, MOST RECENT EPSD NOS (ICD-296.60) ROSACEA (ICD-695.3) FIBROMYALGIA (ICD-729.1) IRRITABLE BOWEL SYNDROME, HX OF (ICD-V12.79) HEADACHE (ICD-784.0) HYPOTHYROIDISM (ICD-244.9) DEPRESSION (ICD-311)  Past Surgical History: Last updated: 02/10/2008 Cholecystectomy Hysterectomy Tonsillectomy Back surgery (07/2001) micro diskectomy Back surgery (08/2001) L facet rhizotomy Appendectomy  Family History: Last updated: 02/10/2008 Family History Osteoporosis Family History High cholesterol Family History  Hypertension Family History of Arthritis Family History of CAD Female 1st degree relative <50-Father No FH of Colon Cancer: Family History of Colon Polyps: Mother  Social History: Last updated: 02/06/2008 disabled Married Never Smoked Alcohol use-no Drug use-no Regular exercise-no  Risk Factors: Alcohol Use: 0 (02/10/2010) Caffeine Use: 0 (02/10/2010) Exercise: yes (02/10/2010)  Risk Factors: Smoking Status: never (02/10/2010) Passive Smoke Exposure: no (02/10/2010)  Family History: Reviewed history from 02/10/2008 and no changes required. Family History Osteoporosis Family History High cholesterol Family History Hypertension Family History of Arthritis Family History of CAD Female 1st degree relative <50-Father No FH of Colon Cancer: Family History of Colon Polyps: Mother  Social History: Reviewed history from 02/06/2008 and no changes required. disabled Married Never Smoked Alcohol use-no Drug use-no Regular exercise-no Does Patient Exercise:  yes  Review of Systems      See HPI General:  Denies chills, fatigue, fever, loss of appetite, malaise, sleep disorder, sweats, weakness, and weight loss. Eyes:  Denies blurring, discharge, double vision, eye irritation, eye pain, halos, itching, light sensitivity, red eye, vision loss-1 eye, and vision loss-both eyes; optho--q1y. ENT:  Denies decreased hearing, difficulty swallowing, ear discharge, earache, hoarseness, nasal congestion, nosebleeds, postnasal drainage, ringing in ears, sinus pressure, and sore throat. CV:  Denies bluish discoloration of lips or nails, chest pain or discomfort, difficulty breathing at night, difficulty breathing while lying down, fainting, fatigue, leg cramps with exertion, lightheadness, near fainting, palpitations, shortness of breath with exertion, swelling of feet, swelling of hands, and weight gain. Resp:  Denies chest discomfort, chest pain with inspiration, cough, coughing up blood,  excessive snoring, hypersomnolence, morning headaches, pleuritic, shortness of breath, sputum productive, and wheezing. GI:  Denies abdominal pain, bloody stools, change in bowel habits, constipation, dark tarry stools, diarrhea, excessive appetite, gas, hemorrhoids, indigestion, loss of appetite, and nausea. GU:  Denies abnormal vaginal bleeding, decreased libido, discharge, dysuria, genital sores, hematuria, incontinence, nocturia, urinary frequency, and urinary hesitancy. MS:  Dr Edmon Crape management. Derm:  Denies changes in color of skin, changes in nail beds, dryness, excessive perspiration, flushing, hair loss, insect bite(s), itching, lesion(s), poor wound healing, and rash. Neuro:  Denies brief paralysis, difficulty with concentration, disturbances in coordination, falling down, headaches, inability to speak, memory loss, numbness, poor balance, seizures, sensation of room spinning, tingling, tremors, visual disturbances, and weakness. Psych:  Denies alternate hallucination ( auditory/visual), anxiety, depression, easily angered, easily tearful, irritability, mental problems, panic attacks, sense of great danger, suicidal thoughts/plans, thoughts of violence, unusual visions or sounds, and thoughts /plans of harming others; Dr Sherilyn Dacosta counseling . Endo:  Denies cold intolerance, excessive hunger, excessive thirst, excessive urination, heat intolerance, polyuria, and weight change; dr balan---thyroid and bp. Heme:  Denies abnormal bruising, bleeding, enlarge lymph nodes, fevers, pallor, and skin discoloration. Allergy:  Denies hives or rash, itching eyes, persistent infections, seasonal allergies, and sneezing.  Physical Exam  General:  Well-developed,well-nourished,in no acute distress; alert,appropriate and cooperative throughout examination Head:  Normocephalic and atraumatic without obvious  abnormalities. No apparent alopecia or balding. Eyes:  vision grossly intact, pupils  equal, pupils round, pupils reactive to light, and no injection.   Ears:  External ear exam shows no significant lesions or deformities.  Otoscopic examination reveals clear canals, tympanic membranes are intact bilaterally without bulging, retraction, inflammation or discharge. Hearing is grossly normal bilaterally. Nose:  External nasal examination shows no deformity or inflammation. Nasal mucosa are pink and moist without lesions or exudates. Mouth:  Oral mucosa and oropharynx without lesions or exudates.  Teeth in good repair. Neck:  No deformities, masses, or tenderness noted.no carotid bruits.   Chest Wall:  No deformities, masses, or tenderness noted. Breasts:  gyn Lungs:  Normal respiratory effort, chest expands symmetrically. Lungs are clear to auscultation, no crackles or wheezes. Heart:  normal rate and Grade  2 /6 systolic ejection murmur.   Abdomen:  Bowel sounds positive,abdomen soft and non-tender without masses, organomegaly or hernias noted. Rectal:  gyn Genitalia:  gyn Msk:  normal ROM, no joint swelling, no joint warmth, no redness over joints, no joint deformities, no joint instability, and no crepitation.   low back pain Pulses:  R posterior tibial normal, R dorsalis pedis normal, R carotid normal, L posterior tibial normal, L dorsalis pedis normal, and L carotid normal.   Extremities:  No clubbing, cyanosis, edema, or deformity noted with normal full range of motion of all joints.   Neurologic:  alert & oriented X3, cranial nerves II-XII intact, strength normal in all extremities, and gait normal.   Skin:  Intact without suspicious lesions or rashes Cervical Nodes:  No lymphadenopathy noted Axillary Nodes:  No palpable lymphadenopathy Psych:  Cognition and judgment appear intact. Alert and cooperative with normal attention span and concentration. No apparent delusions, illusions, hallucinations   Impression & Recommendations:  Problem # 1:  PREVENTIVE HEALTH CARE  (ICD-V70.0)  Orders: Venipuncture (16109) TLB-Lipid Panel (80061-LIPID) TLB-BMP (Basic Metabolic Panel-BMET) (80048-METABOL) TLB-CBC Platelet - w/Differential (85025-CBCD) TLB-Hepatic/Liver Function Pnl (80076-HEPATIC) TLB-TSH (Thyroid Stimulating Hormone) (84443-TSH) EKG w/ Interpretation (93000)  Problem # 2:  COMMON MIGRAINE (ICD-346.10)  Her updated medication list for this problem includes:    Clinoril 200 Mg Tabs (Sulindac) .Marland Kitchen... 1 by mouth am, 1 by mouth pm    Fentanyl 75 Mcg/hr Pt72 (Fentanyl) .Marland Kitchen... 1 patch every other day    Vicodin Hp 10-660 Mg Tabs (Hydrocodone-acetaminophen) .Marland Kitchen... As needed    Prodrin 500-130-20 Mg Tabs (Apap-isometheptene-caffeine) .Marland Kitchen... 1-2 by mouth q 6 hours as needed  Orders: EKG w/ Interpretation (93000)  Problem # 3:  BACK PAIN, LUMBAR (ICD-724.2)  Her updated medication list for this problem includes:    Clinoril 200 Mg Tabs (Sulindac) .Marland Kitchen... 1 by mouth am, 1 by mouth pm    Flexeril 10 Mg Tabs (Cyclobenzaprine hcl) .Marland Kitchen... 1 once daily    Fentanyl 75 Mcg/hr Pt72 (Fentanyl) .Marland Kitchen... 1 patch every other day    Vicodin Hp 10-660 Mg Tabs (Hydrocodone-acetaminophen) .Marland Kitchen... As needed  Orders: Venipuncture ([address]) TLB-Lipid Panel (80061-LIPID) TLB-BMP (Basic Metabolic Panel-BMET) (80048-METABOL) TLB-CBC Platelet - w/Differential (85025-CBCD) TLB-Hepatic/Liver Function Pnl (80076-HEPATIC) TLB-TSH (Thyroid Stimulating Hormone) (84443-TSH) EKG w/ Interpretation (93000)  Discussed use of moist heat or ice, modified activities, medications, and stretching/strengthening exercises. Back care instructions given. To be seen in 2 weeks if no improvement; sooner if worsening of symptoms.   Problem # 4:  HYPERTENSION, ESSENTIAL NOS (ICD-401.9)  Her updated medication list for this problem includes:    Lisinopril 20 Mg Tabs (Lisinopril) .Marland Kitchen... 1 by mouth daily  Orders: Venipuncture (16109) TLB-Lipid Panel (80061-LIPID) TLB-BMP (Basic Metabolic Panel-BMET)  (80048-METABOL) TLB-CBC Platelet - w/Differential (85025-CBCD) TLB-Hepatic/Liver Function Pnl (80076-HEPATIC) TLB-TSH (Thyroid Stimulating Hormone) (84443-TSH) EKG w/ Interpretation (93000)  BP today: 130/78 Prior BP: 120/82 (07/07/2009)  Prior 10 Yr Risk Heart Disease: Not enough information (05/30/2007)  Labs Reviewed: K+: 4.0 (02/07/2009) Creat: : 0.6 (02/07/2009)   Chol: 270 (02/07/2009)   HDL: 40.80 (02/07/2009)   LDL: DEL (02/06/2008)   TG: 293.0 (02/07/2009)  Problem # 5:  BIPOLAR I, MIXED, MOST RECENT EPSD NOS (ICD-296.60)  Orders: Venipuncture ([address]) TLB-Lipid Panel (80061-LIPID) TLB-BMP (Basic Metabolic Panel-BMET) (80048-METABOL) TLB-CBC Platelet - w/Differential (85025-CBCD) TLB-Hepatic/Liver Function Pnl (80076-HEPATIC) TLB-TSH (Thyroid Stimulating Hormone) (84443-TSH) EKG w/ Interpretation (93000)  Problem # 6:  FIBROMYALGIA (ICD-729.1)  Her updated medication list for this problem includes:    Clinoril 200 Mg Tabs (Sulindac) .Marland Kitchen... 1 by mouth am, 1 by mouth pm    Flexeril 10 Mg Tabs (Cyclobenzaprine hcl) .Marland Kitchen... 1 once daily    Fentanyl 75 Mcg/hr Pt72 (Fentanyl) .Marland Kitchen... 1 patch every other day    Vicodin Hp 10-660 Mg Tabs (Hydrocodone-acetaminophen) .Marland Kitchen... As needed  Orders: Venipuncture (09811) TLB-Lipid Panel (80061-LIPID) TLB-BMP (Basic Metabolic Panel-BMET) (80048-METABOL) TLB-CBC Platelet - w/Differential (85025-CBCD) TLB-Hepatic/Liver Function Pnl (80076-HEPATIC) TLB-TSH (Thyroid Stimulating Hormone) (84443-TSH) T-Vitamin D (25-Hydroxy) (91478-29562) EKG w/ Interpretation (93000)  Problem # 7:  HYPOTHYROIDISM (ICD-244.9)  Her updated medication list for this problem includes:    Synthroid 175 Mcg Tabs (Levothyroxine sodium) .Marland Kitchen... 1 by mouth once daily m-friday    Synthroid 200 Mcg Tabs (Levothyroxine sodium) .Marland Kitchen... 1 by mouth sat,sun  Orders: Venipuncture (13086) TLB-Lipid Panel (80061-LIPID) TLB-BMP (Basic Metabolic Panel-BMET)  (80048-METABOL) TLB-CBC Platelet - w/Differential (85025-CBCD) TLB-Hepatic/Liver Function Pnl (80076-HEPATIC) TLB-TSH (Thyroid Stimulating Hormone) (84443-TSH) EKG w/ Interpretation (93000)  Labs Reviewed: TSH: 0.75 (02/07/2009)    HgBA1c: 5.4 (08/15/2007) Chol: 270 (02/07/2009)   HDL: 40.80 (02/07/2009)   LDL: DEL (02/06/2008)   TG: 293.0 (02/07/2009)  Problem # 8:  DEPRESSION (ICD-311)  Her updated medication list for this problem includes:    Klonopin 1 Mg Tabs (Clonazepam) .Marland Kitchen... Take one tablet three times a day    Pristiq 100 Mg Xr24h-tab (Desvenlafaxine succinate) .Marland Kitchen... Take 1 tab once daily  Orders: Venipuncture (57846) TLB-Lipid Panel (80061-LIPID) TLB-BMP (Basic Metabolic Panel-BMET) (80048-METABOL) TLB-CBC Platelet - w/Differential (85025-CBCD) TLB-Hepatic/Liver Function Pnl (80076-HEPATIC) TLB-TSH (Thyroid Stimulating Hormone) (84443-TSH)  Problem # 9:  HX, PERSONAL, URINARY CALCULI (ICD-V13.01) Assessment: Improved  had episode in Dec  Orders: EKG w/ Interpretation (93000)  Complete Medication List: 1)  Synthroid 175 Mcg Tabs (Levothyroxine sodium) .Marland Kitchen.. 1 by mouth once daily m-friday 2)  Synthroid 200 Mcg Tabs (Levothyroxine sodium) .Marland Kitchen.. 1 by mouth sat,sun 3)  Klonopin 1 Mg Tabs (Clonazepam) .... Take one tablet three times a day 4)  Clinoril 200 Mg Tabs (Sulindac) .Marland Kitchen.. 1 by mouth am, 1 by mouth pm 5)  Flexeril 10 Mg Tabs (Cyclobenzaprine hcl) .Marland Kitchen.. 1 once daily 6)  Lamictal 25 Mg Tbdp (Lamotrigine) .Marland Kitchen.. 1 by mouth am, 1 by mouth pm 7)  Fentanyl 75 Mcg/hr Pt72 (Fentanyl) .Marland Kitchen.. 1 patch every other day 8)  Promethazine Hcl 25 Mg Tabs (Promethazine hcl) .... As needed migraines 9)  Vicodin Hp 10-660 Mg Tabs (Hydrocodone-acetaminophen) .... As needed 10)  Centrum Tabs (Multiple vitamins-minerals) .... Once daily 11)  Ester C  .... Once daily 12)  Calcium and Vit D  .... Twice daily 13)  Librax 2.5-5 Mg Caps (Clidinium-chlordiazepoxide) .... Take one tablet by mouth  three times daily,before meals,as needed. 14)  Pristiq 100 Mg Xr24h-tab (Desvenlafaxine succinate) .... Take 1 tab once daily 15)  Lisinopril 20 Mg Tabs (Lisinopril) .Marland Kitchen.. 1 by mouth daily 16)  Vitamin D (ergocalciferol) 50000 Unit Caps (Ergocalciferol) .... Take 1 tab weekly 17)  Vitamin D3 2000 Unit Caps (Cholecalciferol) .... Once daily 18)  Vitamin E 400 Unit Caps (Vitamin e) .... Once daily 19)  Prodrin 500-130-20 Mg Tabs (Apap-isometheptene-caffeine) .Marland Kitchen.. 1-2 by mouth q 6 hours as needed  Patient Instructions: 1)  Please schedule a follow-up appointment in 1 year.  2)  rto flu shot Prescriptions: CLINORIL 200 MG  TABS (SULINDAC) 1 by mouth AM, 1 by mouth PM  #180 Tablet x 5   Entered and Authorized by:   Loreen Freud DO   Signed by:   Loreen Freud DO on 02/10/2010   Method used:   Print then Give to Patient   RxID:   [id number]    EKG  Procedure date:  02/10/2010  Findings:      Normal sinus rhythm with rate of:  89   Last PAP:  Normal (05/28/2007 1:48:32 PM) PAP Result Date:  06/14/2009 PAP Result:  normal PAP Next Due:  1 yr Last Mammogram:  Normal Bilateral (04/18/2007 1:48:32 PM) Mammogram Result Date:  04/20/2009 Mammogram Result:  normal Mammogram Next Due:  1 yr  Appended Document: Karen Brown will be fasting//lch   Appended Document: Karen Brown will be fasting//lch  Laboratory Results   Urine Tests   Date/Time Reported: February 10, 2010 11:24 AM   Routine Urinalysis   Color: yellow Appearance: Clear Glucose: negative   (Normal Range: Negative) Bilirubin: negative   (Normal Range: Negative) Ketone: negative   (Normal Range: Negative) Spec. Gravity: >=1.030   (Normal Range: 1.003-1.035) Blood: negative   (Normal Range: Negative) pH: 5.0   (Normal Range: 5.0-8.0) Protein: negative   (Normal Range: Negative) Urobilinogen: negative   (Normal Range: 0-1) Nitrite: negative   (Normal Range: Negative) Leukocyte Esterace: negative   (Normal Range:  Negative)    Comments: Floydene Flock  February 10, 2010 11:24 AM

## 2010-07-11 NOTE — Miscellaneous (Signed)
Summary: [facility]  Clinical Lists Changes  Medications: Added new medication of OSMOPREP 1.102-0.398 GM  TABS (SOD PHOS MONO-SOD PHOS DIBASIC) As per prep instructions. - Signed Rx of OSMOPREP 1.102-0.398 GM  TABS (SOD PHOS MONO-SOD PHOS DIBASIC) As per prep instructions.;  #32 x 0;  Signed;  Entered by: Ezra Sites RN;  Authorized by: Hart Carwin MD;  Method used: Electronically to [facility]  [address] [phone]*, [address], [place], [place], [place]  [address], Ph: [phone] or [phone], Fax: [phone] Observations: Added new observation of ALLERGY REV: Done (11/24/2009 15:13)    Prescriptions: OSMOPREP 1.102-0.398 GM  TABS (SOD PHOS MONO-SOD PHOS DIBASIC) As per prep instructions.  #32 x 0   Entered by:   Ezra Sites RN   Authorized by:   Hart Carwin MD   Signed by:   Ezra Sites RN on 11/24/2009   Method used:   Electronically to        [facility] [address] [phone]* (retail)       [address]       [place], [place]  [address]       Ph: [phone] or [phone]       Fax: [phone]   RxID:   [id number]

## 2010-07-11 NOTE — Progress Notes (Signed)
Summary: Refill Request  Phone Note Refill Request Message from:  Pharmacy on [facility] Fax #: [phone]  Refills Requested: Medication #1:  PROMETHAZINE HCL 25 MG  TABS as needed MIGRAINES   Dosage confirmed as above?Dosage Confirmed Initial call taken by: Harold Barban,  July 25, 2009 1:17 PM    Prescriptions: PROMETHAZINE HCL 25 MG  TABS (PROMETHAZINE HCL) as needed MIGRAINES  #60 Tablet x 0   Entered by:   Army Fossa CMA   Authorized by:   Loreen Freud DO   Signed by:   Army Fossa CMA on 07/25/2009   Method used:   [facility]ronically to        [facility] [address] [phone]* (retail)       [address]       [place], [place]  [address]       Ph: [phone] or [phone]       Fax: [phone]   RxID:   [id number]

## 2010-07-11 NOTE — Procedures (Signed)
Summary: Colonoscopy  Patient: [name] Note: All result statuses are Final unless otherwise noted.  Tests: (1) Colonoscopy (COL)   COL Colonoscopy           DONE     [facility]     [address] [place], [place]  [address]           COLONOSCOPY PROCEDURE REPORT           PATIENT:  Karen Brown, Karen Brown  MR#:  [id number]     BIRTHDATE:  [birth year], 48 yrs. old  GENDER:  female     ENDOSCOPIST:  Hedwig Morton. Juanda Chance, MD     REF. BY:  Loreen Freud, DO     PROCEDURE DATE:  12/09/2009     PROCEDURE:  Colonoscopy 82956     ASA CLASS:  Class II     INDICATIONS:  constipation episodic explosive diarrhea,     last colon 2001 in [place] was normal     pt has been on high doses of narcotics for chronic pain     MEDICATIONS:   See Anesthesia Report.           DESCRIPTION OF PROCEDURE:   After the risks benefits and     alternatives of the procedure were thoroughly explained, informed     consent was obtained.  Digital rectal exam was performed and     revealed no rectal masses.   The [id number] ([id number]) endoscope was     introduced through the anus and advanced to the cecum, which was     identified by the ileocecal valve, without limitations.  The     quality of the prep was good, using MiraLax.  The instrument was     then slowly withdrawn as the colon was fully examined.     <<PROCEDUREIMAGES>>           FINDINGS:  No polyps or cancers were seen. Random biopsies were     obtained and sent to pathology (see image001, image002, and     image003).   Retroflexed views in the rectum revealed no     abnormalities.    The scope was then withdrawn from the patient     and the procedure completed.           COMPLICATIONS:  None     ENDOSCOPIC IMPRESSION:     1) No polyps or cancers     2) Normal colonoscopy     RECOMMENDATIONS:     Miralax 17 gm daily,     add Dulcolox tab 1 po qod     REPEAT EXAM:  In 10 year(s) for.           ______________________________     Hedwig Morton. Juanda Chance,  MD           CC:           n.     eSIGNED:   Hedwig Morton. Kynslee Baham at 12/09/2009 10:12 AM           Polly Cobia, [phone]  Note: An exclamation mark (!) indicates a result that was not dispersed into the flowsheet. Document Creation Date: 12/09/2009 10:12 AM _______________________________________________________________________  (1) Order result status: Final Collection or observation date-time: 12/09/2009 10:06 Requested date-time:  Receipt date-time:  Reported date-time:  Referring Physician:   Ordering Physician: Lina Sar [phone]) Specimen Source:  Source: Launa Grill Order Number: [id number] Lab site:   Appended Document: Colonoscopy Recall is in IDX  for 12/2019.

## 2010-07-11 NOTE — Progress Notes (Signed)
Summary: Colon/Propofol Scheduled   Phone Note Call from Patient Call back at Work Phone [phone]   Caller: Patient Call For: Juanda Chance Reason for Call: Talk to Nurse Summary of Call: Patient has questions regarding scheduling a colon, wants to discuss with nurse Initial call taken by: Tawni Levy,  November 14, 2009 11:06 AM  Follow-up for Phone Call        Per OV note 03-02-09, pt. will need a Colonoscopy w/propofol and a double prep. in 1 year.   Pt. is calling to get Colonoscopy scheduled now due to increased "attacks" of diarrhea/abd.pain. Pt. is scheduled for a Previsit on 11-24-09 at 3:30pm and her procedure at [facility] on 12-09-09 at 9am.   **DR.BRODIE--The OV note states the pt. will need a double prep. and the pt. states Miralax didn't work last time--please advise.  Follow-up by: Laureen Ochs LPN,  November 14, 1476 12:29 PM  Additional Follow-up for Phone Call Additional follow up Details #1::        Mag. Citrate 1 bottle by mouth 2 days before colon and Osmoprep 1 day prior to the colonoscopy ( split in 2.) Hart Carwin MD,  November 14, 2009 1:26 PM  Per Dr Juanda Chance, patient will do mag citrate prep on Tuesday, will be on clear liquids as of Tuesday night. Osmoprep. wednesday. Colonoscopy Friday.   Noted the need for a double prep. and for the previsit nurse to see me, on pt's previsit order in IDX and I will review with the previsit nurse closer to the previsit date.  Additional Follow-up by: Laureen Ochs LPN,  November 15, 2954 2:09 PM

## 2010-07-11 NOTE — Assessment & Plan Note (Signed)
Summary: burning /frequent urination/cbs   Vital Signs:  Patient profile:   50 year old female Weight:      231.4 pounds Temp:     98.5 degrees F oral Pulse rate:   100 / minute Pulse rhythm:   regular BP sitting:   134 / 78  (right arm) Cuff size:   large  Vitals Entered By: Almeta Monas CMA Duncan Dull) (March 15, 2010 11:55 AM) CC: c/o frequency and burning while urinating, Dysuria Flu Vaccine Consent Questions     Do you have a history of severe allergic reactions to this vaccine? no    Any prior history of allergic reactions to egg and/or gelatin? no    Do you have a sensitivity to the preservative Thimersol? no    Do you have a past history of Guillan-Barre Syndrome? no    Do you currently have an acute febrile illness? no    Have you ever had a severe reaction to latex? no    Vaccine information given and explained to patient? yes    Are you currently pregnant? no    Lot Number:[id number]   Exp Date:12/09/2010   Site Given  Left Deltoid IM   History of Present Illness:  Dysuria      This is a Karen Brown who presents with Dysuria.  The symptoms began 1 week ago.  The patient complains of burning with urination, urinary frequency, and urgency, but denies hematuria, vaginal discharge, vaginal itching, vaginal sores, and penile discharge.  Associated symptoms include abdominal pain.  The patient denies the following associated symptoms: nausea, vomiting, fever, shaking chills, flank pain, back pain, pelvic pain, and arthralgias.  The patient denies the following risk factors: diabetes, prior antibiotics, immunosuppression, history of GU anomaly, history of pyelonephritis, pregnancy, history of STD, and analgesic abuse.    Current Medications (verified): 1)  Synthroid 175 Mcg  Tabs (Levothyroxine Sodium) .Marland Kitchen.. 1 By Mouth Once Daily M-Friday 2)  Synthroid 200 Mcg  Tabs (Levothyroxine Sodium) .Marland Kitchen.. 1 By Mouth Sat,sun 3)  Klonopin 1 Mg Tabs (Clonazepam) .... Take One Tablet  Three Times A Day 4)  Clinoril 200 Mg  Tabs (Sulindac) .Marland Kitchen.. 1 By Mouth Am, 1 By Mouth Pm 5)  Flexeril 10 Mg  Tabs (Cyclobenzaprine Hcl) .Marland Kitchen.. 1 Once Daily 6)  Lamictal 25 Mg  Tbdp (Lamotrigine) .Marland Kitchen.. 1 By Mouth Am, 1 By Mouth Pm 7)  Fentanyl 75 Mcg/hr  Pt72 (Fentanyl) .Marland Kitchen.. 1 Patch Every Other Day 8)  Promethazine Hcl 25 Mg  Tabs (Promethazine Hcl) .... As Needed Migraines 9)  Vicodin Hp 10-660 Mg  Tabs (Hydrocodone-Acetaminophen) .... As Needed 10)  Centrum  Tabs (Multiple Vitamins-Minerals) .... Once Daily 11)  Ester C .... Once Daily 12)  Calcium and Vit D .... Twice Daily 13)  Librax 2.5-5 Mg  Caps (Clidinium-Chlordiazepoxide) .... Take One Tablet By Mouth Three Times Daily,before Meals,as Needed. 14)  Pristiq 100 Mg Xr24h-Tab (Desvenlafaxine Succinate) .... Take 1 Tab Once Daily 15)  Lisinopril 20 Mg Tabs (Lisinopril) .Marland Kitchen.. 1 By Mouth Daily 16)  Vitamin D (Ergocalciferol) 50000 Unit Caps (Ergocalciferol) .... Take 1 Tab Weekly 17)  Vitamin D3 2000 Unit Caps (Cholecalciferol) .... Once Daily 18)  Vitamin E 400 Unit Caps (Vitamin E) .... Once Daily 19)  Prodrin 500-130-20 Mg Tabs (Apap-Isometheptene-Caffeine) .Marland Kitchen.. 1-2 By Mouth Q 6 Hours As Needed 20)  Cipro 500 Mg Tabs (Ciprofloxacin Hcl) .Marland Kitchen.. 1 By Mouth Two Times A Day  Allergies (verified): 1)  ! Pcn 2)  !  Sulfa 3)  ! * Topamax 4)  ! * Oral Steroids 5)  ! * Donnatal 6)  ! Phenobarbital 7)  ! Toradol 8)  ! * Sonata  Past History:  Past medical, surgical, family and social histories (including risk factors) reviewed for relevance to current acute and chronic problems.  Past Medical History: Reviewed history from 02/10/2008 and no changes required. Depression Hypothyroidism Headache Hx of PREMATURE VENTRICULAR CONTRACTIONS (ICD-427.69) GERD (ICD-530.81) ISCHEMIC COLITIS, HX OF (ICD-V12.79) Hx of ENDOMETRIOSIS, SITE UNSPECIFIED (ICD-617.9) PREVENTIVE HEALTH CARE (ICD-V70.0) FAMILY HISTORY OF CAD FEMALE 1ST DEGREE RELATIVE <50  (ICD-V17.3) FAMILY HISTORY OSTEOPOROSIS (ICD-V17.8) BACK PAIN, LUMBAR (ICD-724.2) MURMUR (ICD-785.2) HYPERTENSION, ESSENTIAL NOS (ICD-401.9) DIVERTICULOSIS OF COLON (ICD-562.10) HX, PERSONAL, URINARY CALCULI (ICD-V13.01) BIPOLAR I, MIXED, MOST RECENT EPSD NOS (ICD-296.60) ROSACEA (ICD-695.3) FIBROMYALGIA (ICD-729.1) IRRITABLE BOWEL SYNDROME, HX OF (ICD-V12.79) HEADACHE (ICD-784.0) HYPOTHYROIDISM (ICD-244.9) DEPRESSION (ICD-311)  Past Surgical History: Reviewed history from 02/10/2008 and no changes required. Cholecystectomy Hysterectomy Tonsillectomy Back surgery (07/2001) micro diskectomy Back surgery (08/2001) L facet rhizotomy Appendectomy  Family History: Reviewed history from 02/10/2008 and no changes required. Family History Osteoporosis Family History High cholesterol Family History Hypertension Family History of Arthritis Family History of CAD Female 1st degree relative <50-Father No FH of Colon Cancer: Family History of Colon Polyps: Mother  Social History: Reviewed history from 02/06/2008 and no changes required. disabled Married Never Smoked Alcohol use-no Drug use-no Regular exercise-no  Review of Systems      See HPI  Physical Exam  General:  Well-developed,well-nourished,in no acute distress; alert,appropriate and cooperative throughout examination Abdomen:  + suprapubic  tenderness no flank pain Skin:  Intact without suspicious lesions or rashes Psych:  Cognition and judgment appear intact. Alert and cooperative with normal attention span and concentration. No apparent delusions, illusions, hallucinations   Impression & Recommendations:  Problem # 1:  UTI (ICD-599.0)  Orders: T-Culture, Urine (16[address]-60454) UA Dipstick w/o Micro (manual) (09811)  Encouraged to push clear liquids, get enough rest, and take acetaminophen as needed. To be seen in 10 days if no improvement, sooner if worse.  Her updated medication list for this problem  includes:    Cipro 500 Mg Tabs (Ciprofloxacin hcl) .Marland Kitchen... 1 by mouth two times a day  Complete Medication List: 1)  Synthroid 175 Mcg Tabs (Levothyroxine sodium) .Marland Kitchen.. 1 by mouth once daily m-friday 2)  Synthroid 200 Mcg Tabs (Levothyroxine sodium) .Marland Kitchen.. 1 by mouth sat,sun 3)  Klonopin 1 Mg Tabs (Clonazepam) .... Take one tablet three times a day 4)  Clinoril 200 Mg Tabs (Sulindac) .Marland Kitchen.. 1 by mouth am, 1 by mouth pm 5)  Flexeril 10 Mg Tabs (Cyclobenzaprine hcl) .Marland Kitchen.. 1 once daily 6)  Lamictal 25 Mg Tbdp (Lamotrigine) .Marland Kitchen.. 1 by mouth am, 1 by mouth pm 7)  Fentanyl 75 Mcg/hr Pt72 (Fentanyl) .Marland Kitchen.. 1 patch every other day 8)  Promethazine Hcl 25 Mg Tabs (Promethazine hcl) .... As needed migraines 9)  Vicodin Hp 10-660 Mg Tabs (Hydrocodone-acetaminophen) .... As needed 10)  Centrum Tabs (Multiple vitamins-minerals) .... Once daily 11)  Ester C  .... Once daily 12)  Calcium and Vit D  .... Twice daily 13)  Librax 2.5-5 Mg Caps (Clidinium-chlordiazepoxide) .... Take one tablet by mouth three times daily,before meals,as needed. 14)  Pristiq 100 Mg Xr24h-tab (Desvenlafaxine succinate) .... Take 1 tab once daily 15)  Lisinopril 20 Mg Tabs (Lisinopril) .Marland Kitchen.. 1 by mouth daily 16)  Vitamin D (ergocalciferol) 50000 Unit Caps (Ergocalciferol) .... Take 1 tab weekly 17)  Vitamin D3 2000  Unit Caps (Cholecalciferol) .... Once daily 18)  Vitamin E 400 Unit Caps (Vitamin e) .... Once daily 19)  Prodrin 500-130-20 Mg Tabs (Apap-isometheptene-caffeine) .Marland Kitchen.. 1-2 by mouth q 6 hours as needed 20)  Cipro 500 Mg Tabs (Ciprofloxacin hcl) .Marland Kitchen.. 1 by mouth two times a day  Other Orders: Admin 1st Vaccine (21308) Flu Vaccine 30yrs + (65784)  Patient Instructions: 1)  Please schedule a follow-up appointment in 2 weeks--  for repeat urine only                                                                                                     Prescriptions: CIPRO 500 MG TABS (CIPROFLOXACIN HCL) 1 by mouth two times a day  #10  x 0   Entered and Authorized by:   Loreen Freud DO   Signed by:   Loreen Freud DO on 03/15/2010   Method used:   Electronically to        [facility]  [address] [phone]* (retail)       [address]       [place], [place]  [address]       Ph: [phone] or [phone]       Fax: [phone]   RxID:   [id number]   Laboratory Results   Urine Tests    Routine Urinalysis   Color: yellow Appearance: Clear Glucose: negative   (Normal Range: Negative) Bilirubin: negative   (Normal Range: Negative) Ketone: negative   (Normal Range: Negative) Spec. Gravity: 1.015   (Normal Range: 1.003-1.035) Blood: negative   (Normal Range: Negative) pH: 5.0   (Normal Range: 5.0-8.0) Protein: negative   (Normal Range: Negative) Urobilinogen: 0.2   (Normal Range: 0-1) Nitrite: negative   (Normal Range: Negative) Leukocyte Esterace: small   (Normal Range: Negative)

## 2010-07-11 NOTE — Progress Notes (Signed)
Summary: refill  Phone Note Refill Request Call back at Work Phone [phone] Message from:  F[facility] fax [phone]  Written rx for generic midrin on longterm B/O no aval BTA. Prodrin is in stock see attach sheet.  Initial call taken by: Barb Merino,  July 04, 2009 4:22 PM  Follow-up for Phone Call        what attached sheet? Follow-up by: Loreen Freud DO,  July 04, 2009 4:56 PM  Additional Follow-up for Phone Call Additional follow up Details #1::        midrin not available if pt is using tha many /month---she needs referral--has she seen HA specialist?  Additional Follow-up by: Loreen Freud DO,  July 04, 2009 5:10 PM    Additional Follow-up for Phone Call Additional follow up Details #2::    lmtcb Army Fossa CMA  July 04, 2009 5:24 PM   Additional Follow-up for Phone Call Additional follow up Details #3:: Details for Additional Follow-up Action Taken: Pt states that she has 3 left she is taking it once every couple of weeks- the refill we sent on 05/24/09 was never filled the pharmacy was already out of the medication. She would rather not see the HA specilaist if you could just switch medications for her. Please advise. Army Fossa CMA  July 05, 2009 8:12 AM   Ultram 50 mg  #60  1-2  by mouth every 6 hours as needed   yrlowne 07/05/2009 150p   Pt states that ultram gives her headaches, she is coming in on thursday for an appt she will discuss all this then  with you. Army Fossa CMA  July 05, 2009 2:59 PM  Pt called back and said it is toradol that she cannot take, but she thinks she has tried Tramadol in the past. So it maybe easier to just discuss when she comes in on thursday. Army Fossa CMA  July 05, 2009 3:39 PM     Appended Document: refill Ok to send prodrin to pharmacy #[id number]

## 2010-07-11 NOTE — Letter (Signed)
Summary: [facility]  [facility]   Imported By: Lanelle Bal 06/15/2009 13:29:33  _____________________________________________________________________  External Attachment:    Type:   Image     Comment:   External Document

## 2010-07-11 NOTE — Letter (Signed)
Summary: Osmoprep Instructions  [facility]  [address] [place], [place] [address]   Phone: [phone]  Fax: [phone]       APRIL COLTER    [birth year]    MRN: [id number]        Procedure Day /Date:  Friday 12/09/2009     Arrival Time: 7:15 am      Procedure Time: 9:00 am     Location of Procedure:                     _x _  [facility] ( Outpatient Registration)       PREPARATION FOR COLONOSCOPY WITH OSMOPREP AND MAGNESIUM CITRATE  Starting 5 days prior to your procedure Sunday 6/26 do not eat nuts, seeds, popcorn, corn, beans, peas,  salads, or any raw vegetables.  Do not take any fiber supplements (e.g. Metamucil, Citrucel, and Benefiber). _________________________________________________________________________________________________     1. At 7:00 pm on Tuesday 6/28,  drink one bottle of Magnesium Citrate over ice. (Purchase at pharmacy)    2. Drink at least 3 more glasses of clear liquids before bedtime (preferably juices).       3. Results are expected usually within 1 to 6 hours after taking the Magnesium Citrate.        DATE: Wednesday  6/29   (2 DAYS BEFORE PROCEDURE)      1.   Drink clear liquids the entire day - NO SOLID FOOD.  Drink at least 64 oz. of fluid during the day to prevent hydration and help the prep work efficiently.    2.   Do not drink anything colored red or purple.  Avoid juices with pulp.  No orange juice.              CLEAR LIQUIDS INCLUDE: Water Jello Ice Popsicles Tea (sugar ok, no milk/cream) Powdered fruit flavored drinks Coffee (sugar ok, no milk/cream) Gatorade Juice: apple, white grape, white cranberry  Lemonade Clear bullion, consomm, broth Carbonated beverages (any kind) Strained chicken noodle soup Hard Candy   3.   Beginning at 5:00 p.m. or 6:00 p.m.  on Wednesday, drink one dose (4 tablets with 8 oz. of any clear liquid) every 15 minutes for a total of 5 doses (20 tablets  total).  ___________________________________________________________________________________________________  THE DAY BEFORE THE PROCEDURE: THURSDAY 6/30  DRINK CLEAR LIQUIDS FOR THE ENTIRE DAY 1.   Beginning at 5:00 pm, drink one dose (4 tablets with 8 oz. of any clear liquid) every 15 minutes for a total of 3 doses (12 tablets).  2. Continue drinking clear liquids until bedtime.  3. NOTHING TO EAT OR DRINK AFTER MIDNIGHT       MEDICATION INSTRUCTIONS  Unless otherwise instructed, you should take regular prescription medications with a small sip of water as early as possible the morning of your procedure.              OTHER INSTRUCTIONS  You will need a responsible adult at least 52 years of age to accompany you and drive you home.   This person must remain in the waiting room during your procedure.  Wear loose fitting clothing that is easily removed.  Leave jewelry and other valuables at home.  However, you may wish to bring a book to read or an iPod/MP3 player to listen to music as you wait for your procedure to start.  Remove all body piercing jewelry and leave at home.  Total time from sign-in until  discharge is approximately 2-3 hours.  You should go home directly after your procedure and rest.  You can resume normal activities the day after your procedure.  The day of your procedure you should not:   Drive   Make legal decisions   Operate machinery   Drink alcohol   Return to work  You will receive specific instructions about eating, activities and medications before you leave.   The above instructions have been reviewed and explained to me by   _______________________ Ezra Sites RN  November 24, 2009 3:31 PM   I fully understand and can verbalize these instructions _____________________________ Date _________

## 2010-07-11 NOTE — Progress Notes (Signed)
Summary: refill request  Phone Note Refill Request Message from:  Fax from Pharmacy on January 09, 2010 5:08 PM  Refills Requested: Medication #1:  PROMETHAZINE HCL 25 MG  TABS as needed MIGRAINES  Medication #2:  Sulindac please advise.  Initial call taken by: Lucious Groves CMA,  January 09, 2010 5:09 PM  Follow-up for Phone Call        refill x1 Follow-up by: Loreen Freud DO,  January 09, 2010 5:20 PM  Additional Follow-up for Phone Call Additional follow up Details #1::        done via refill request received this AM Additional Follow-up by: Lucious Groves CMA,  January 10, 2010 9:06 AM

## 2010-07-12 ENCOUNTER — Encounter: Payer: Self-pay | Admitting: Obstetrics and Gynecology

## 2010-07-19 NOTE — Progress Notes (Signed)
Summary: refill  Phone Note Refill Request Call back at Work Phone [phone] Message from:  Patient  Refills Requested: Medication #1:  CLINORIL 200 MG  TABS 1 by mouth AM   Supply Requested: 3 months  Medication #2:  PRODRIN 500-130-20 MG TABS 1-2 by mouth q 6 hours as needed.   Supply Requested: 3 months  Medication #3:  PROMETHAZINE HCL 25 MG  TABS as needed MIGRAINES   Supply Requested: 3 months Pt left VM that she need to get med refill and needs to have hard Rx to pick-up to send to mail order..........Marland KitchenFelecia Deloach CMA  July 10, 2010 4:44 PM    Follow-up for Phone Call        please advise if it is ok to fill the promethazine for 3 months.. Follow-up by: Almeta Monas CMA Duncan Dull),  July 11, 2010 12:05 PM  Additional Follow-up for Phone Call Additional follow up Details #1::        its ok Additional Follow-up by: Loreen Freud DO,  July 11, 2010 12:52 PM    Additional Follow-up for Phone Call Additional follow up Details #2::    pt aware Rx ready for pick up Follow-up by: Almeta Monas CMA Duncan Dull),  July 11, 2010 1:05 PM  Prescriptions: PRODRIN 500-130-20 MG TABS (APAP-ISOMETHEPTENE-CAFFEINE) 1-2 by mouth q 6 hours as needed  #90 Tablet x 0   Entered by:   Almeta Monas CMA (AAMA)   Authorized by:   Loreen Freud DO   Signed by:   Almeta Monas CMA (AAMA) on 07/11/2010   Method used:   Print then Give to Patient   RxID:   [id number] PROMETHAZINE HCL 25 MG  TABS (PROMETHAZINE HCL) as needed MIGRAINES  #90 Tablet x 0   Entered by:   Almeta Monas CMA (AAMA)   Authorized by:   Loreen Freud DO   Signed by:   Almeta Monas CMA (AAMA) on 07/11/2010   Method used:   Print then Give to Patient   RxID:   [id number] CLINORIL 200 MG  TABS (SULINDAC) 1 by mouth AM, 1 by mouth PM  #180 Tablet x 0   Entered by:   Almeta Monas CMA (AAMA)   Authorized by:   Loreen Freud DO   Signed by:   Almeta Monas CMA (AAMA) on 07/11/2010   Method used:    Print then Give to Patient   RxID:   [id number]

## 2010-09-12 LAB — URINALYSIS, ROUTINE W REFLEX MICROSCOPIC
Bilirubin Urine: NEGATIVE
Glucose, UA: NEGATIVE mg/dL
Ketones, ur: NEGATIVE mg/dL
Nitrite: NEGATIVE
Specific Gravity, Urine: 1.013 (ref 1.005–1.030)
pH: 6 (ref 5.0–8.0)

## 2010-09-12 LAB — PREGNANCY, URINE: Preg Test, Ur: NEGATIVE

## 2010-09-12 LAB — URINE MICROSCOPIC-ADD ON

## 2010-10-11 ENCOUNTER — Other Ambulatory Visit: Payer: Self-pay | Admitting: Family Medicine

## 2010-10-27 NOTE — Consult Note (Signed)
NAMEADIRA, LIMBURG NO.:  [id number]   MEDICAL RECORD NO.:  [id number]          PATIENT TYPE:  INP   LOCATION:  5731                         FACILITY:  [facility]   PHYSICIAN:  Iva Boop, M.D. LHCDATE OF BIRTH:  01-21-1961   DATE OF CONSULTATION:  10/23/2005  DATE OF DISCHARGE:                                   CONSULTATION   REFERRING PHYSICIAN:  Dr. Rene Paci who is covering for Dr. Rosalyn Gess. Norins.   REASON FOR CONSULTATION:  Diverticulitis and abdominal pain.   ASSESSMENT:  1.  Abdominal pain somewhat diffuse, more on the left side though she has      had chronic right-sided abdominal pain. There were several issues.  She      appears to have left-sided diverticulitis which is improving based upon      the CT from today versus last week.  2.  A 2.6 x 2.0 cm low attenuation lesion near the cecum that could be an      ovarian lesion, mucocele or an appendiceal stump, lymphocele or a      seroma.  This is known from CT scans in [place] in the last year or      two.  Apparently, there has been some pain on the right side as well and      the patient is very concerned about this.  3.  The patient relates a history of brown urine prior to admission.  Her      urinalysis here was negative.  She is convinced it was blood.   RECOMMENDATIONS/PLAN:  1.  Continue Cipro and Flagyl.  2.  Note that her white blood cell count went up to 17 but then fell back      down to 13,000.  3.  Schedule MRI of the abdomen and pelvis.  She also has a possible liver      lesion, so we will be able to look at these two things with the MRI.  4.  She has a colonoscopy planned for six days from now by Dr. Lina Sar.      We may need to do it while she is in the hospital, but depending upon      what is seen, she can probably follow through with that as an      outpatient.  I think she should continue to treat her diverticulitis and      recover from that.  She has  been on approximately seven days of that      since Oct 16, 2005.   HISTORY:  A 50 year old white woman, known to Dr. Juanda Chance in the past and  then returned to see her, characterized as irritable bowel syndrome,  diarrhea predominant.  She has developed chronic low back pain for which she  is pursuing disability.  She has been on Dilaudid and has developed some  constipation.  She went to the emergency department last night after she  passed what she described as a clot of blood as written above.  Urinalysis  was negative.  CT scan  with the findings as above.  Thickened left colon.  I  have personally viewed this film with Dr. Chestine Spore of radiology.  She has had  chronic recurrent abdominal pain in [place] and here in the past.  She  sees Dr. Vear Clock in the pain clinic.  There is a history of ovarian cyst on  the right and left when she was in [place].  They knew about this mass  on the right side, which has come and gone actually and not been present on  both CTs.  She has been seen for diverticulitis in [place] as well.  Cipro and Flagyl prescribed by Dr. Debby Bud.  She has not been anemic.  White  count changes above.  I do not believe she has had fevers.   ALLERGIES:  1.  SULFA.  2.  CEPHALOSPORINS.  3.  PENICILLIN.  4.  TOPAMAX.  5.  IBUPROFEN.  6.  TITRALAC.   MEDICATIONS AT HOME AND ON ADMISSION:  1.  Clinoril 10 mg b.i.d.  2.  Flexeril 10 mg b.i.d.  3.  HCTZ 25 mg daily.  4.  Klonopin 0.5 mg daily.  5.  Lamictal 25 mg b.i.d.  6.  Lexapro 20 mg daily.  7.  Potassium 20 mEq daily.  8.  Synthroid 150 mcg daily.  9.  Vicodin 10 mg p.r.n. pain.  10. Fentanyl patch 75 mcg every 48 hours.  11. Bentyl 10 mg every four to six hours.   PAST MEDICAL HISTORY:  1.  As above.  2.  Degenerative disc disease, status post lumbar discectomy.  3.  Obesity.  4.  Chronic back pain.  5.  Endometriosis with multiple exploratory laparotomy with hysterectomy and      vaginal in  the past.  6.  Ovarian cyst.  7.  Appendectomy.  8.  Cholecystectomy.  9.  Tonsillectomy.  10. Diverticulitis.  11. Irritable bowel syndrome.  12. Nephrolithiasis February 2004.  13. Fatty liver on previous CT scan.  14. Possible depression.   SOCIAL HISTORY:  She is disabled back pain, trying to get Social Security  disability.  No alcohol or cigarettes.   FAMILY HISTORY:  Mother had colon polyps.  Otherwise negative.   REVIEW OF SYSTEMS:  She has not had any flank pain or dysuria.  She has a  chronic back pain.  She has insomnia.  She has chronic abdominal pain.  She  has an irregular heart beat at times.  She has had difficulty with anxiety  and depression as well as fibromyalgia diagnosis apparently.   All other systems appeared negative at this time.   PHYSICAL EXAMINATION:  Reveals an obese middle-aged white woman.  She does  not look acutely ill.  Blood pressure 137/98, pulse 86, respirations 18.  Eyes anicteric.  ENT and mouth free of lesions.  Neck no mass and no  thyromegaly. Chest clear.  Heart S1, S2.  No rubs or gallops are heard.  Abdomen is obese.  She has some left lower quadrant tenderness to deep  palpation without any guarding.  She has some right mid-quadrant tenderness  but upon repeated palpation these areas of tenderness are not entirely  consistent.  She was hemoccult last week and yesterday.  We did not repeat  the rectal exam.  Extremities had no clubbing, cyanosis, or edema.  Neurologic shows she is alert and oriented x3.  Affect is appropriate.  Skin  has no rash.   Hemoglobin 13.1, white count 13.7 on the third check, hematocrit  39, BMET  normal.  Lipase 13.  LFTs normal.  UA negative.   I appreciate the opportunity to care for this patient.      Iva Boop, M.D. [facility]  Electronically Signed     CEG/MEDQ  D:  10/23/2005  T:  10/23/2005  Job:  [id number]   cc:   Lina Sar, M.D. [facility]  [address] [place]  [place] [address]   Rosalyn Gess. Norins, M.D. [facility]  [address]  [place]  [place] [address]   Loraine Leriche L. Vear Clock, M.D.  Fax: [phone]   Juluis Mire, M.D.  Fax: [phone]

## 2010-10-27 NOTE — Op Note (Signed)
[place]  Patient:    Karen Brown, Karen Brown Visit Number: [id number] MRN: [id number]          Service Type: DSU Location: [id number] Attending Physician:  Gerald Dexter Dictated by:   Reinaldo Meeker, M.D. Proc. Date: 07/17/01 Admit Date:  07/17/2001                             Operative Report  PREOPERATIVE DIAGNOSIS:  Herniated disk L5-S1, right.  POSTOPERATIVE DIAGNOSIS:  Herniated disk L5-S1, right.  OPERATION PERFORMED:  Right L5-S1 interlaminal laminotomy for excision of herniated disk with the operating microscope.  Secondary procedure includes microdissection of L5-S1 disk and S1 nerve roots.  SURGEON:  Reinaldo Meeker, M.D.  ASSISTANT:  Donalee Citrin, Montez Hageman.  ANESTHESIA:  DESCRIPTION OF PROCEDURE:  After being placed in prone position, the patients back was prepped and draped in the usual sterile fashion.  A localizing x-ray was taken prior to incision to identify the appropriate level.  Midline incision made over the spinous processes of L5-S1.  Using the Bovie cutting current, the incision was carried out the spinous processes.  Subperiosteal dissection then carried out on the right side of the spinous process and lamina and the McCullough self-retaining retractor was placed for exposure.  A second x-ray was taken to confirm approach to the L5-S1 level and this was correct.  Using the high speed drill the inferior third of the L5 lamina and the medial one third of the faced joint removed.  Drill was then used to removed the superior one third of the S1 lamina.  Residual bone and ligamentum flavum were removed in a piecemeal fashion.  The microscope was draped and brought into the field and used for the remainder of the case.  Using microdissection technique, the lateral aspect of the thecal sac and S1 nerve root were identified.  Further coagulation was carried out down to the floor of the canal to identify the L5-S1 disk.   After the nerve root was mobilized medially a huge cyst creation was noted medial to the nerve root underneath the midline thecal sac.  After coagulating out the annulus, the annulus was incised with a 15 blade.  Using pituitary rongeurs and curets, a very thorough disk space clean out was carried.  At the same time great care was taken to avoid injury to the neural elements and this was successfully done.  At this point inspection was carried out in all directions for any evidence of any residual compression.  None could be identified.  Large amounts of irrigation were carried out and any bleeding controlled with bipolar coagulation and Gelfoam.  The wound was then closed using interrupted Vicryl in the muscle and fascia,  subcutaneous and subcuticular tissues and Dermabond and Steri-Strips on the skin.  A sterile dressing was then applied and the patient was extubated and taken to the recovery room in stable condition. Dictated by:   Reinaldo Meeker, M.D. Attending Physician:  Gerald Dexter DD:  07/17/01 TD:  07/17/01 Job: [id number]

## 2011-01-11 ENCOUNTER — Other Ambulatory Visit: Payer: Self-pay | Admitting: Family Medicine

## 2011-03-19 ENCOUNTER — Other Ambulatory Visit: Payer: Self-pay | Admitting: Family Medicine

## 2011-04-09 ENCOUNTER — Encounter: Payer: Self-pay | Admitting: Family Medicine

## 2011-04-09 ENCOUNTER — Ambulatory Visit (INDEPENDENT_AMBULATORY_CARE_PROVIDER_SITE_OTHER): Payer: Medicare Other | Admitting: Family Medicine

## 2011-04-09 VITALS — BP 134/72 | HR 96 | Temp 98.9°F | Wt 229.0 lb

## 2011-04-09 DIAGNOSIS — Z23 Encounter for immunization: Secondary | ICD-10-CM

## 2011-04-09 DIAGNOSIS — F3289 Other specified depressive episodes: Secondary | ICD-10-CM

## 2011-04-09 DIAGNOSIS — G43909 Migraine, unspecified, not intractable, without status migrainosus: Secondary | ICD-10-CM

## 2011-04-09 DIAGNOSIS — R51 Headache: Secondary | ICD-10-CM

## 2011-04-09 DIAGNOSIS — G43009 Migraine without aura, not intractable, without status migrainosus: Secondary | ICD-10-CM

## 2011-04-09 DIAGNOSIS — IMO0001 Reserved for inherently not codable concepts without codable children: Secondary | ICD-10-CM

## 2011-04-09 DIAGNOSIS — F329 Major depressive disorder, single episode, unspecified: Secondary | ICD-10-CM

## 2011-04-09 DIAGNOSIS — E039 Hypothyroidism, unspecified: Secondary | ICD-10-CM

## 2011-04-09 DIAGNOSIS — K589 Irritable bowel syndrome without diarrhea: Secondary | ICD-10-CM

## 2011-04-09 DIAGNOSIS — Z780 Asymptomatic menopausal state: Secondary | ICD-10-CM

## 2011-04-09 DIAGNOSIS — I10 Essential (primary) hypertension: Secondary | ICD-10-CM

## 2011-04-09 MED ORDER — SULINDAC 200 MG PO TABS: ORAL_TABLET | ORAL | Status: DC

## 2011-04-09 MED ORDER — ESTRADIOL 0.0375 MG/24HR TD PTTW: 1.0000 | MEDICATED_PATCH | TRANSDERMAL | Status: DC

## 2011-04-09 MED ORDER — LEVOTHYROXINE SODIUM 175 MCG PO TABS
175.0000 ug | ORAL_TABLET | Freq: Every day | ORAL | Status: DC
Start: 2011-04-09 — End: 2011-05-23

## 2011-04-09 MED ORDER — CYCLOBENZAPRINE HCL 10 MG PO TABS: ORAL_TABLET | ORAL | Status: DC

## 2011-04-09 MED ORDER — PROMETHAZINE HCL 25 MG PO TABS: 25.0000 mg | ORAL_TABLET | Freq: Four times a day (QID) | ORAL | Status: DC | PRN

## 2011-04-09 MED ORDER — LEVOTHYROXINE SODIUM 200 MCG PO TABS: ORAL_TABLET | ORAL | Status: DC

## 2011-04-09 MED ORDER — CILIDINIUM-CHLORDIAZEPOXIDE 2.5-5 MG PO CAPS: 1.0000 | ORAL_CAPSULE | Freq: Three times a day (TID) | ORAL | Status: DC | PRN

## 2011-04-09 MED ORDER — ISOMETHEPTENE-CAFFEINE-APAP 130-20-500 MG PO TABS: ORAL_TABLET | ORAL | Status: DC

## 2011-04-09 NOTE — Assessment & Plan Note (Signed)
Stable con't meds 

## 2011-04-09 NOTE — Assessment & Plan Note (Signed)
Per pain management.  

## 2011-04-09 NOTE — Assessment & Plan Note (Signed)
stable °

## 2011-04-09 NOTE — Assessment & Plan Note (Signed)
Controlled Cont meds rto 6 months for cpe

## 2011-04-09 NOTE — Progress Notes (Signed)
  Subjective:    Patient ID: Karen Brown, female    DOB: 02-01-1961, 50 y.o.   MRN: [id number]  HPI Pt here med refills and flu shot.  Pt sees endo-Dr Talmage Nap for bp and thyroid.  She sees pain management for fibromyalgia and GI for IBS.   No other complaints.    Review of Systems As above    Objective:   Physical Exam  Constitutional: She is oriented to person, place, and time. She appears well-developed and well-nourished.  Cardiovascular: Normal rate, regular rhythm and normal heart sounds.   No murmur heard. Pulmonary/Chest: Effort normal and breath sounds normal. No respiratory distress. She has no wheezes. She has no rales. She exhibits no tenderness.  Musculoskeletal: She exhibits no edema.  Neurological: She is alert and oriented to person, place, and time.  Psychiatric: She has a normal mood and affect. Her behavior is normal. Judgment and thought content normal.          Assessment & Plan:

## 2011-04-09 NOTE — Patient Instructions (Signed)

## 2011-05-01 ENCOUNTER — Encounter: Payer: Self-pay | Admitting: Internal Medicine

## 2011-05-17 ENCOUNTER — Encounter: Payer: Self-pay | Admitting: *Deleted

## 2011-05-23 ENCOUNTER — Encounter: Payer: Self-pay | Admitting: Internal Medicine

## 2011-05-23 ENCOUNTER — Ambulatory Visit (INDEPENDENT_AMBULATORY_CARE_PROVIDER_SITE_OTHER): Payer: Medicare Other | Admitting: Internal Medicine

## 2011-05-23 DIAGNOSIS — K589 Irritable bowel syndrome without diarrhea: Secondary | ICD-10-CM

## 2011-05-23 DIAGNOSIS — K5900 Constipation, unspecified: Secondary | ICD-10-CM

## 2011-05-23 MED ORDER — LUBIPROSTONE 24 MCG PO CAPS: 24.0000 ug | ORAL_CAPSULE | Freq: Every day | ORAL | Status: AC

## 2011-05-23 MED ORDER — METRONIDAZOLE 250 MG PO TABS: 250.0000 mg | ORAL_TABLET | Freq: Two times a day (BID) | ORAL | Status: AC

## 2011-05-23 NOTE — Patient Instructions (Addendum)
We have sent the following medications to your pharmacy for you to pick up at your convenience: Amitiza Flagyl CC: Dr Laury Axon

## 2011-05-23 NOTE — Progress Notes (Signed)
Sona Nations [birth year] MRN [id number]        History of Present Illness:  This is a 50 year old white female with irregular bowel habits with irritable bowel syndrome and predominant diarrhea. We have seen her in the past for attacks of severe crampy abdominal pain and diarrhea. She is dependent on narcotics for control of back pain taking fentanyl patches 75 mcg every 48 hours and hydrocodone 10/625 averaging 4-5 tablets a day. She did very well on short course of Flagyl ,high-fiber diet and fiber supplements but her symptoms have recurred and she is now having attacks of abdominal pain about once a week. Preceding the attacks of diarrhea she feels constipated. CT scan of the abdomen and pelvis in December 2010 showed right kidney stone and hydronephrosis but no active disease. Other medical problems include remote cholecystectomy. Bipolar disorder, fibromyalgia, history of endometriosis, obesity, and history of ischemic colitis   Past Medical History  Diagnosis Date  . GERD (gastroesophageal reflux disease)   . Depression   . Hypertension   . Bipolar 1 disorder   . Fibromyalgia   . IBS (irritable bowel syndrome)   . Hypothyroidism   . PVC (premature ventricular contraction)   . Colitis, ischemic   . Diverticulosis   . Anxiety   . Hemorrhoids   . Hyperlipemia    Past Surgical History  Procedure Date  . Cholecystectomy   . Abdominal hysterectomy     Partial   . Tonsillectomy   . Back surgery     Micro diskectomy  . Back surgery     L facet rhzotomy  . Appendectomy     reports that she has never smoked. She has never used smokeless tobacco. She reports that she does not drink alcohol or use illicit drugs. family history includes Colon polyps in her mother; Coronary artery disease in her father; and Kidney disease in her father.  There is no history of Colon cancer. Allergies  Allergen Reactions  . Donnatal   . Ketorolac Tromethamine   . Penicillins     REACTION: HIVES    . Phenobarbital   . Sulfonamide Derivatives   . Topiramate   . Zaleplon         Review of Systems:, Overweight  The remainder of the 10 point ROS is negative except as outlined in H&P   Physical Exam: General appearance  Well developed, in no distress. Eyes- non icteric. HEENT nontraumatic, normocephalic. Mouth no lesions, tongue papillated, no cheilosis. Neck supple without adenopathy, thyroid not enlarged, no carotid bruits, no JVD. Lungs Clear to auscultation bilaterally. Cor normal S1, normal S2, regular rhythm, no murmur,  quiet precordium. Abdomen: Protuberant normoactive bowel sounds. No hernia no palpable mass. Rectal: Not done. Extremities no pedal edema. Skin no lesions. Neurological alert and oriented x 3. Psychological normal mood and affect.  Assessment and Plan:  Problem #70 50 year old white female with narcotic dependence and altered GI motility resulting in irregular bowel habits. She is predominantly constipated and has overflow diarrhea. There may be an element of postcholecystectomy diarrhea as well. He have discussed at length her laxative regimen which will include probiotics daily, Amitiza, 24 mcg daily, high fiber diet, and metamucil 1 pack a day which contains 5 g of fiber/package. We will give her a short course of Flagyl 250 mg twice a day for 10 days. She may also add green tea or a cup of coffee or cider in the mornings. I will see her on a when necessary basis.  05/23/2011 Lina Sar

## 2011-05-24 ENCOUNTER — Telehealth: Payer: Self-pay | Admitting: Family Medicine

## 2011-05-24 MED ORDER — PROMETHAZINE HCL 25 MG PO TABS: 25.0000 mg | ORAL_TABLET | ORAL | Status: DC | PRN

## 2011-05-24 NOTE — Telephone Encounter (Signed)
Patient needs new rx for promethazine - 90 day supply - [facility]

## 2011-05-24 NOTE — Telephone Encounter (Signed)
Faxed.   KP 

## 2011-05-24 NOTE — Telephone Encounter (Signed)
ok 

## 2011-05-24 NOTE — Telephone Encounter (Signed)
Please advise if this is ok to fill    KP

## 2011-05-28 ENCOUNTER — Other Ambulatory Visit (INDEPENDENT_AMBULATORY_CARE_PROVIDER_SITE_OTHER): Payer: Medicare Other

## 2011-05-28 ENCOUNTER — Other Ambulatory Visit: Payer: Self-pay | Admitting: Family Medicine

## 2011-05-28 DIAGNOSIS — I10 Essential (primary) hypertension: Secondary | ICD-10-CM

## 2011-05-29 LAB — CBC WITH DIFFERENTIAL/PLATELET
Basophils Relative: 0.1 % (ref 0.0–3.0)
Eosinophils Relative: 2.8 % (ref 0.0–5.0)
Lymphocytes Relative: 34.9 % (ref 12.0–46.0)
Monocytes Relative: 1.4 % — ABNORMAL LOW (ref 3.0–12.0)
Neutrophils Relative %: 60.8 % (ref 43.0–77.0)
RBC: 4.73 Mil/uL (ref 3.87–5.11)
WBC: 6.8 10*3/uL (ref 4.5–10.5)

## 2011-05-29 LAB — HEPATIC FUNCTION PANEL
AST: 24 U/L (ref 0–37)
Albumin: 4 g/dL (ref 3.5–5.2)
Alkaline Phosphatase: 82 U/L (ref 39–117)
Bilirubin, Direct: 0 mg/dL (ref 0.0–0.3)
Total Bilirubin: 0.4 mg/dL (ref 0.3–1.2)

## 2011-05-29 LAB — LIPID PANEL
HDL: 47.3 mg/dL (ref >39.00)
Total CHOL/HDL Ratio: 6
Triglycerides: 328 mg/dL — ABNORMAL HIGH (ref 0.0–149.0)
VLDL: 65.6 mg/dL — ABNORMAL HIGH (ref 0.0–40.0)

## 2011-05-29 LAB — BASIC METABOLIC PANEL
Calcium: 9 mg/dL (ref 8.4–10.5)
Creatinine, Ser: 0.8 mg/dL (ref 0.4–1.2)

## 2011-05-29 LAB — LDL CHOLESTEROL, DIRECT: Direct LDL: 185.2 mg/dL

## 2011-06-19 ENCOUNTER — Telehealth: Payer: Self-pay | Admitting: Family Medicine

## 2011-06-19 NOTE — Telephone Encounter (Signed)
Mailed    KP 

## 2011-06-19 NOTE — Telephone Encounter (Signed)
Patient is requesting last labs be mailed to her.

## 2011-06-22 ENCOUNTER — Ambulatory Visit (INDEPENDENT_AMBULATORY_CARE_PROVIDER_SITE_OTHER): Payer: Medicare Other | Admitting: Family Medicine

## 2011-06-22 ENCOUNTER — Encounter: Payer: Self-pay | Admitting: Family Medicine

## 2011-06-22 VITALS — BP 110/75 | HR 95 | Temp 98.9°F | Ht 65.5 in | Wt 225.0 lb

## 2011-06-22 DIAGNOSIS — R350 Frequency of micturition: Secondary | ICD-10-CM

## 2011-06-22 DIAGNOSIS — IMO0001 Reserved for inherently not codable concepts without codable children: Secondary | ICD-10-CM

## 2011-06-22 DIAGNOSIS — N390 Urinary tract infection, site not specified: Secondary | ICD-10-CM

## 2011-06-22 LAB — POCT URINALYSIS DIPSTICK

## 2011-06-22 MED ORDER — CIPROFLOXACIN HCL 500 MG PO TABS: 500.0000 mg | ORAL_TABLET | Freq: Two times a day (BID) | ORAL | Status: AC

## 2011-06-22 NOTE — Patient Instructions (Signed)
This appears to be a UTI Start the Cipro twice daily x5 days Drink plenty of fluids AZO as needed Call with any questions or concerns Hang in there!!!

## 2011-06-22 NOTE — Assessment & Plan Note (Signed)
Pt's sxs consistent w/ infxn.  UA invalid due to AZO.  Start Cipro as pt has tolerated this w/out difficulty.  Reviewed supportive care and red flags that should prompt return.  Pt expressed understanding and is in agreement w/ plan.

## 2011-06-22 NOTE — Progress Notes (Signed)
  Subjective:    Patient ID: Jaquelynn Wanamaker, female    DOB: [birth year], 51 y.o.   MRN: [id number]  HPI Dysuria and urgency- sxs started Wednesday w/ urgency.  Developed dysuria today.  Hx of similar.  Increased frequency, hesitancy.  Mild R back tenderness.  Denies suprapubic pain.  No fevers.   Review of Systems For ROS see HPI     Objective:   Physical Exam  Vitals reviewed. Constitutional: She appears well-developed and well-nourished. No distress.  Abdominal: Soft. She exhibits no distension. There is no tenderness (no suprapubic or CVA tenderness).          Assessment & Plan:

## 2011-06-25 LAB — URINE CULTURE

## 2011-06-29 ENCOUNTER — Ambulatory Visit: Payer: Medicare Other | Admitting: Rehabilitative and Restorative Service Providers"

## 2011-07-10 ENCOUNTER — Ambulatory Visit: Payer: Medicare Other | Attending: Endocrinology | Admitting: Physical Therapy

## 2011-07-10 DIAGNOSIS — R42 Dizziness and giddiness: Secondary | ICD-10-CM | POA: Insufficient documentation

## 2011-07-10 DIAGNOSIS — IMO0001 Reserved for inherently not codable concepts without codable children: Secondary | ICD-10-CM | POA: Insufficient documentation

## 2011-07-13 ENCOUNTER — Other Ambulatory Visit: Payer: Self-pay | Admitting: Obstetrics and Gynecology

## 2011-07-13 DIAGNOSIS — Z1231 Encounter for screening mammogram for malignant neoplasm of breast: Secondary | ICD-10-CM

## 2011-07-18 ENCOUNTER — Encounter: Payer: Medicare Other | Admitting: Physical Therapy

## 2011-07-23 ENCOUNTER — Ambulatory Visit: Payer: Medicare Other | Attending: Endocrinology | Admitting: Physical Therapy

## 2011-07-23 DIAGNOSIS — R42 Dizziness and giddiness: Secondary | ICD-10-CM | POA: Insufficient documentation

## 2011-07-23 DIAGNOSIS — IMO0001 Reserved for inherently not codable concepts without codable children: Secondary | ICD-10-CM | POA: Insufficient documentation

## 2011-07-24 ENCOUNTER — Ambulatory Visit
Admission: RE | Admit: 2011-07-24 | Discharge: 2011-07-24 | Disposition: A | Discharge status: Home / Self Care | Payer: Medicare Other | Source: Ambulatory Visit | Attending: Obstetrics and Gynecology | Admitting: Obstetrics and Gynecology

## 2011-07-24 DIAGNOSIS — Z1231 Encounter for screening mammogram for malignant neoplasm of breast: Secondary | ICD-10-CM

## 2011-07-30 ENCOUNTER — Encounter: Payer: Medicare Other | Admitting: Physical Therapy

## 2011-07-31 ENCOUNTER — Ambulatory Visit (INDEPENDENT_AMBULATORY_CARE_PROVIDER_SITE_OTHER): Payer: Medicare Other | Admitting: Family Medicine

## 2011-07-31 ENCOUNTER — Encounter: Payer: Self-pay | Admitting: Family Medicine

## 2011-07-31 VITALS — BP 120/72 | HR 97 | Temp 98.7°F | Wt 222.4 lb

## 2011-07-31 DIAGNOSIS — H6690 Otitis media, unspecified, unspecified ear: Secondary | ICD-10-CM

## 2011-07-31 DIAGNOSIS — G43909 Migraine, unspecified, not intractable, without status migrainosus: Secondary | ICD-10-CM

## 2011-07-31 DIAGNOSIS — J329 Chronic sinusitis, unspecified: Secondary | ICD-10-CM

## 2011-07-31 DIAGNOSIS — H6691 Otitis media, unspecified, right ear: Secondary | ICD-10-CM

## 2011-07-31 MED ORDER — ANTIPYRINE-BENZOCAINE 5.4-1.4 % OT SOLN: 3.0000 [drp] | OTIC | Status: AC | PRN

## 2011-07-31 MED ORDER — CLARITHROMYCIN ER 500 MG PO TB24: 1000.0000 mg | ORAL_TABLET | Freq: Every day | ORAL | Status: AC

## 2011-07-31 MED ORDER — ISOMETHEPTENE-CAFFEINE-APAP 130-20-500 MG PO TABS: ORAL_TABLET | ORAL | Status: DC

## 2011-07-31 MED ORDER — FLUTICASONE PROPIONATE 50 MCG/ACT NA SUSP: 2.0000 | Freq: Every day | NASAL | Status: DC

## 2011-07-31 NOTE — Patient Instructions (Signed)
Sinusitis Sinuses are air pockets within the bones of your face. The growth of bacteria within a sinus leads to infection. The infection prevents the sinuses from draining. This infection is called sinusitis. SYMPTOMS  There will be different areas of pain depending on which sinuses have become infected.  The maxillary sinuses often produce pain beneath the eyes.   Frontal sinusitis may cause pain in the middle of the forehead and above the eyes.  Other problems (symptoms) include:  Toothaches.   Colored, pus-like (purulent) drainage from the nose.   Swelling, warmth, and tenderness over the sinus areas may be signs of infection.  TREATMENT  Sinusitis is most often determined by an exam.X-rays may be taken. If x-rays have been taken, make sure you obtain your results or find out how you are to obtain them. Your caregiver may give you medications (antibiotics). These are medications that will help kill the bacteria causing the infection. You may also be given a medication (decongestant) that helps to reduce sinus swelling.  HOME CARE INSTRUCTIONS   Only take over-the-counter or prescription medicines for pain, discomfort, or fever as directed by your caregiver.   Drink extra fluids. Fluids help thin the mucus so your sinuses can drain more easily.   Applying either moist heat or ice packs to the sinus areas may help relieve discomfort.   Use saline nasal sprays to help moisten your sinuses. The sprays can be found at your local drugstore.  SEEK IMMEDIATE MEDICAL CARE IF:  You have a fever.   You have increasing pain, severe headaches, or toothache.   You have nausea, vomiting, or drowsiness.   You develop unusual swelling around the face or trouble seeing.  MAKE SURE YOU:   Understand these instructions.   Will watch your condition.   Will get help right away if you are not doing well or get worse.  Document Released: 05/28/2005 Document Revised: 02/07/2011 Document Reviewed:  12/25/2006 ExitCare Patient Information 2012 [employer]  Otitis Media You or your child has otitis media. This is an infection of the middle chamber of the ear. This condition is common in young children and often follows upper respiratory infections. Symptoms of otitis media may include earache or ear fullness, hearing loss, or fever. If the eardrum ruptures, a middle ear infection may also cause bloody or pus-like discharge from the ear. Fussiness, irritability, and persistent crying may be the only signs of otitis media in small children. Otitis media can be caused by a bacteria or a virus. Antibiotics may be used to treat bacterial otitis media. But antibiotics are not effective against viral infections. Not every case of bacterial otitis media requires antibiotics and depending on age, severity of infection, and other risk factors, observation may be all that is required. Ear drops or oral medicines may be prescribed to reduce pain, fever, or congestion. Babies with ear infections should not be fed while lying on their backs. This increases the pressure and pain in the ear. Do not put cotton in the ear canal or clean it with cotton swabs. Swimming should be avoided if the eardrum has ruptured or if there is drainage from the ear canal. If your child experiences recurrent infections, your child may need to be referred to an Ear, Nose, and Throat specialist. HOME CARE INSTRUCTIONS   Take any antibiotic as directed by your caregiver. You or your child may feel better in a few days, but take all medicine or the infection may not respond and may   become more difficult to treat.   Only take over-the-counter or prescription medicines for pain, discomfort, or fever as directed by your caregiver. Do not give aspirin to children.  Otitis media can lead to complications including rupture of the eardrum, long-term hearing loss, and more severe infections. Call your caregiver for follow-up care at the end of  treatment. SEEK IMMEDIATE MEDICAL CARE IF:   Your or your child's problems do not improve within 2 to 3 days.   You or your child has an oral temperature above 102 F (38.9 C), not controlled by medicine.   Your baby is older than 3 months with a rectal temperature of 102 F (38.9 C) or higher.   Your baby is 3 months old or younger with a rectal temperature of 100.4 F (38 C) or higher.   Your child develops increased fussiness.   You or your child develops a stiff neck, severe headache, or confusion.   There is swelling around the ear.   There is dizziness, vomiting, unusual sleepiness, seizures, or twitching of facial muscles.   The pain or ear drainage persists beyond 2 days of antibiotic treatment.  Document Released: 07/05/2004 Document Revised: 02/07/2011 Document Reviewed: 09/23/2009 ExitCare Patient Information 2012 [employer]. 

## 2011-07-31 NOTE — Progress Notes (Signed)
  Subjective:     Karen Brown is a 51 y.o. female who presents for evaluation of sinus pain. Symptoms include: congestion, cough, facial pain, nasal congestion, sinus pressure and ear pain. Onset of symptoms was 6 days ago. Symptoms have been gradually worsening since that time. Past history is significant for no history of pneumonia or bronchitis. Patient is a non-smoker.  The following portions of the patient's history were reviewed and updated as appropriate: allergies, current medications, past family history, past medical history, past social history, past surgical history and problem list.  Review of Systems Pertinent items are noted in HPI.   Objective:    BP 120/72  Pulse 97  Temp(Src) 98.7 F (37.1 C) (Oral)  Wt 222 lb 6.4 oz (100.88 kg)  SpO2 97% General appearance: alert, cooperative, appears stated age and no distress Ears: normal TM and external ear canal left ear and abnormal TM right ear - erythematous and dull Nose: green discharge, moderate congestion, turbinates red, edematous, inflamed, sinus tenderness bilateral Throat: lips, mucosa, and tongue normal; teeth and gums normal Neck: no adenopathy, supple, symmetrical, trachea midline and thyroid not enlarged, symmetric, no tenderness/mass/nodules Lungs: clear to auscultation bilaterally Heart: regular rate and rhythm, S1, S2 normal, no murmur, click, rub or gallop    Assessment:    Acute bacterial sinusitis.  ROM   Plan:    Nasal steroids per medication orders. Antihistamines per medication orders. Biaxin per medication orders. f/u prn

## 2011-08-06 ENCOUNTER — Encounter: Payer: Medicare Other | Admitting: Physical Therapy

## 2011-08-07 ENCOUNTER — Telehealth: Payer: Self-pay | Admitting: *Deleted

## 2011-08-07 MED ORDER — CLOTRIMAZOLE 10 MG MT TROC: 10.0000 mg | Freq: Every day | OROMUCOSAL | Status: AC

## 2011-08-07 NOTE — Telephone Encounter (Signed)
Please advise      KP 

## 2011-08-07 NOTE — Telephone Encounter (Signed)
Discussed with patient and she voiced understanding and agreed to recommendations.    KP

## 2011-08-07 NOTE — Telephone Encounter (Signed)
Clotrimazole troche 10 mg po 5x / day x 14 days-----if not working she needs ov

## 2011-08-07 NOTE — Telephone Encounter (Signed)
Triage Call Report Triage Record Num: [id number] Operator: Di Kindle Patient Name: Karen Brown Call Date & Time: 08/07/2011 4:35:12PM Patient Phone: [phone] PCP: Lelon Perla Patient Gender: Female PCP Fax : [phone] Patient DOB: Sep 01, 1960 Practice Name: [facility] Reason for Call: Caller: [name]/Patient; PCP: Lelon Perla.; CB#: [phone]; Call regarding mouth symptoms; onset 08/07/11 of mouth being sore, tongue and top of mouth have a red rash, white on tongue, white patches in back of mouth, afebrile, after taking Biaxin since OV 07/31/11, taking fluids. Symptoms reviewed Mouth Lesions, requests RX for losenges like she has used before to [facility]. Please call. OFFICE Please Protocol(s) Used: Mouth Lesions Recommended Outcome per Protocol: See Provider within 72 Hours Reason for Outcome: Dry or irritated mouth or tongue within several days of beginning prescribed, nonprescription, OR alternative medication Care Advice: ~ Call provider if have a swollen lymph gland in front of the ear or under the jaw. Increase intake of fluids. Drinking through a straw may reduce discomfort. Eat foods that are easy to swallow. Avoid drinking carbonated or alcoholic drinks. ~

## 2011-11-22 ENCOUNTER — Other Ambulatory Visit: Payer: Self-pay | Admitting: Internal Medicine

## 2011-11-25 ENCOUNTER — Other Ambulatory Visit: Payer: Self-pay | Admitting: Internal Medicine

## 2011-11-26 NOTE — Telephone Encounter (Signed)
From: Hart Carwin, MD    Sent: 11/26/2011   4:12 PM      To: Richardson Chiquito, CMA  OK to send Librax, #90, 1 po tid prn, 2 refills ----- Message -----    From: Richardson Chiquito, CMA    Sent: 11/26/2011   8:40 AM      To: Hart Carwin, MD  Dr Juanda Chance- This patient request Librax refills from [place]. However, I cant find where we have given it in some time to the patient. Looks like Dr Laury Axon was the previous prescriber. Are you okay with me sending an rx under [place]?

## 2012-04-03 ENCOUNTER — Ambulatory Visit (INDEPENDENT_AMBULATORY_CARE_PROVIDER_SITE_OTHER): Payer: Medicare Other

## 2012-04-03 DIAGNOSIS — Z23 Encounter for immunization: Secondary | ICD-10-CM

## 2012-05-01 ENCOUNTER — Other Ambulatory Visit: Payer: Self-pay | Admitting: Family Medicine

## 2012-05-01 MED ORDER — PROMETHAZINE HCL 25 MG PO TABS: 25.0000 mg | ORAL_TABLET | ORAL | Status: DC | PRN

## 2012-05-01 NOTE — Telephone Encounter (Signed)
Please advise      KP 

## 2012-05-01 NOTE — Telephone Encounter (Signed)
NOTE NEW pharmacy removed others refill for PromethazineHCL TAB  25 MG Take 1 tablet (25 mg total) by mouth every 6 (six) hours as needed for nausea. requesting 90-day supply last wrt 12.13.12 #90 last ov 2.9.13 acute

## 2012-05-01 NOTE — Telephone Encounter (Signed)
Fill x1

## 2012-07-01 ENCOUNTER — Other Ambulatory Visit: Payer: Self-pay | Admitting: Obstetrics and Gynecology

## 2012-07-01 DIAGNOSIS — Z1231 Encounter for screening mammogram for malignant neoplasm of breast: Secondary | ICD-10-CM

## 2012-08-01 ENCOUNTER — Ambulatory Visit
Admission: RE | Admit: 2012-08-01 | Discharge: 2012-08-01 | Disposition: A | Discharge status: Home / Self Care | Payer: Medicare Other | Source: Ambulatory Visit | Attending: Obstetrics and Gynecology | Admitting: Obstetrics and Gynecology

## 2012-08-01 DIAGNOSIS — Z1231 Encounter for screening mammogram for malignant neoplasm of breast: Secondary | ICD-10-CM

## 2012-10-22 ENCOUNTER — Ambulatory Visit (INDEPENDENT_AMBULATORY_CARE_PROVIDER_SITE_OTHER): Payer: Medicare Other | Admitting: Family Medicine

## 2012-10-22 ENCOUNTER — Encounter: Payer: Self-pay | Admitting: Family Medicine

## 2012-10-22 ENCOUNTER — Ambulatory Visit (HOSPITAL_BASED_OUTPATIENT_CLINIC_OR_DEPARTMENT_OTHER)
Admission: RE | Admit: 2012-10-22 | Discharge: 2012-10-22 | Disposition: A | Discharge status: Home / Self Care | Payer: Medicare Other | Source: Ambulatory Visit | Attending: Family Medicine | Admitting: Family Medicine

## 2012-10-22 VITALS — BP 114/66 | HR 92 | Temp 98.6°F | Wt 218.8 lb

## 2012-10-22 DIAGNOSIS — R300 Dysuria: Secondary | ICD-10-CM

## 2012-10-22 DIAGNOSIS — R319 Hematuria, unspecified: Secondary | ICD-10-CM

## 2012-10-22 DIAGNOSIS — K5730 Diverticulosis of large intestine without perforation or abscess without bleeding: Secondary | ICD-10-CM | POA: Insufficient documentation

## 2012-10-22 DIAGNOSIS — N200 Calculus of kidney: Secondary | ICD-10-CM | POA: Insufficient documentation

## 2012-10-22 LAB — POCT URINALYSIS DIPSTICK
Bilirubin, UA: NEGATIVE
Glucose, UA: NEGATIVE
Leukocytes, UA: NEGATIVE
Nitrite, UA: NEGATIVE
Urobilinogen, UA: 0.2

## 2012-10-22 MED ORDER — CIPROFLOXACIN HCL 500 MG PO TABS: 500.0000 mg | ORAL_TABLET | Freq: Two times a day (BID) | ORAL | Status: AC

## 2012-10-22 NOTE — Progress Notes (Signed)
  Subjective:    Karen Brown is a 52 y.o. female who complains of hematuria and back pain. She has had symptoms for a few months. Patient also complains of back pain. Patient denies congestion, cough, fever, headache, rhinitis, sorethroat, stomach ache and vaginal discharge. Patient does have a history of recurrent UTI. Patient does not have a history of pyelonephritis. + hx kidney stones  The following portions of the patient's history were reviewed and updated as appropriate: allergies, current medications, past family history, past medical history, past social history, past surgical history and problem list.  Review of Systems Pertinent items are noted in HPI.    Objective:    BP 114/66  Pulse 92  Temp(Src) 98.6 F (37 C) (Oral)  Wt 218 lb 12.8 oz (99.247 kg)  BMI 35.84 kg/m2  SpO2 97% General appearance: alert, cooperative and moderate distress Back: + R flank pain Abdomen: abnormal findings:  mild tenderness suprapubic  Laboratory:  Urine dipstick: large for hemoglobin.   Micro exam: not done.    Assessment:    hematuria     Plan:    Medications: ciprofloxacin. Maintain adequate hydration. Follow up if symptoms not improving, and as needed.  Ct scan r/o stone Strain urine

## 2012-10-24 ENCOUNTER — Telehealth: Payer: Self-pay | Admitting: Family Medicine

## 2012-10-24 LAB — URINE CULTURE: Colony Count: 25000

## 2012-10-24 NOTE — Telephone Encounter (Signed)
Patient called requesting medication to help her pass kidney stones. Appt w/urology isn't until Thursday, 10/30/12. [facility] [address].

## 2012-10-24 NOTE — Telephone Encounter (Signed)
Faxed

## 2012-10-24 NOTE — Telephone Encounter (Signed)
Blood in urine and kidney stones  [facility] [phone] needs notes faxed to them on the patient thank

## 2012-10-26 NOTE — Telephone Encounter (Signed)
There is no med to help pass stone--- just pain med for pain.  If its not working ER

## 2012-10-27 NOTE — Telephone Encounter (Signed)
Discuss with patient  

## 2013-01-14 ENCOUNTER — Other Ambulatory Visit: Payer: Self-pay | Admitting: Family Medicine

## 2013-01-16 ENCOUNTER — Telehealth: Payer: Self-pay | Admitting: Family Medicine

## 2013-01-16 MED ORDER — ISOMETHEPTENE-APAP-DICHLORAL 65-325-100 MG PO CAPS: ORAL_CAPSULE | ORAL | Status: DC

## 2013-01-16 NOTE — Telephone Encounter (Signed)
Please advise      KP 

## 2013-01-16 NOTE — Telephone Encounter (Signed)
Rx sent      KP 

## 2013-01-16 NOTE — Telephone Encounter (Signed)
Mildred at [facility] is calling to let us know that PRODRIN 500-130-20 MG TABS is no longer made and wants for Dr. Laury Axon to consider an alternative of: Isometheptene Mucate Caffeine with acetaminophen. Please advise.

## 2013-01-16 NOTE — Telephone Encounter (Signed)
Ok for #60 1 po at first sign of ha and may repeat 2 hours later x1

## 2013-02-03 ENCOUNTER — Encounter: Payer: Medicare Other | Admitting: Family Medicine

## 2013-02-24 ENCOUNTER — Encounter: Payer: Self-pay | Admitting: Lab

## 2013-02-25 ENCOUNTER — Ambulatory Visit (INDEPENDENT_AMBULATORY_CARE_PROVIDER_SITE_OTHER): Payer: Medicare Other | Admitting: Family Medicine

## 2013-02-25 ENCOUNTER — Encounter: Payer: Self-pay | Admitting: Family Medicine

## 2013-02-25 VITALS — BP 112/72 | HR 83 | Temp 98.5°F | Ht 65.5 in | Wt 212.4 lb

## 2013-02-25 DIAGNOSIS — E2839 Other primary ovarian failure: Secondary | ICD-10-CM

## 2013-02-25 DIAGNOSIS — E669 Obesity, unspecified: Secondary | ICD-10-CM | POA: Insufficient documentation

## 2013-02-25 DIAGNOSIS — I10 Essential (primary) hypertension: Secondary | ICD-10-CM

## 2013-02-25 DIAGNOSIS — G43909 Migraine, unspecified, not intractable, without status migrainosus: Secondary | ICD-10-CM

## 2013-02-25 DIAGNOSIS — Z0000 Encounter for general adult medical examination without abnormal findings: Secondary | ICD-10-CM

## 2013-02-25 LAB — BASIC METABOLIC PANEL
BUN: 12 mg/dL (ref 6–23)
Creatinine, Ser: 0.9 mg/dL (ref 0.4–1.2)
GFR: 74.09 mL/min (ref >60.00)
Glucose, Bld: 94 mg/dL (ref 70–99)

## 2013-02-25 LAB — POCT URINALYSIS DIPSTICK
Bilirubin, UA: NEGATIVE
Blood, UA: NEGATIVE
Leukocytes, UA: NEGATIVE
Nitrite, UA: NEGATIVE
Protein, UA: NEGATIVE
Urobilinogen, UA: 0.2
pH, UA: 6

## 2013-02-25 LAB — LIPID PANEL
Cholesterol: 343 mg/dL — ABNORMAL HIGH (ref 0–200)
HDL: 49.4 mg/dL (ref >39.00)
Total CHOL/HDL Ratio: 7
Triglycerides: 338 mg/dL — ABNORMAL HIGH (ref 0.0–149.0)
VLDL: 67.6 mg/dL — ABNORMAL HIGH (ref 0.0–40.0)

## 2013-02-25 LAB — CBC WITH DIFFERENTIAL/PLATELET
Basophils Absolute: 0 10*3/uL (ref 0.0–0.1)
Eosinophils Relative: 1.7 % (ref 0.0–5.0)
MCV: 85 fl (ref 78.0–100.0)
Monocytes Absolute: 0.4 10*3/uL (ref 0.1–1.0)
Neutrophils Relative %: 65.5 % (ref 43.0–77.0)
Platelets: 314 10*3/uL (ref 150.0–400.0)
RDW: 13 % (ref 11.5–14.6)
WBC: 8.2 10*3/uL (ref 4.5–10.5)

## 2013-02-25 LAB — HEPATIC FUNCTION PANEL
ALT: 32 U/L (ref 0–35)
Bilirubin, Direct: 0 mg/dL (ref 0.0–0.3)
Total Bilirubin: 0.5 mg/dL (ref 0.3–1.2)

## 2013-02-25 MED ORDER — ISOMETHEPTENE-CAFFEINE-APAP 65-20-325 MG PO TABS: 1.0000 | ORAL_TABLET | ORAL | Status: DC

## 2013-02-25 MED ORDER — PROMETHAZINE HCL 25 MG PO TABS: 25.0000 mg | ORAL_TABLET | ORAL | Status: DC | PRN

## 2013-02-25 NOTE — Patient Instructions (Addendum)
Preventive Care for Adults, Female A healthy lifestyle and preventive care can promote health and wellness. Preventive health guidelines for women include the following key practices.  A routine yearly physical is a good way to check with your caregiver about your health and preventive screening. It is a chance to share any concerns and updates on your health, and to receive a thorough exam.  Visit your dentist for a routine exam and preventive care every 6 months. Brush your teeth twice a day and floss once a day. Good oral hygiene prevents tooth decay and gum disease.  The frequency of eye exams is based on your age, health, family medical history, use of contact lenses, and other factors. Follow your caregiver's recommendations for frequency of eye exams.  Eat a healthy diet. Foods like vegetables, fruits, whole grains, low-fat dairy products, and lean protein foods contain the nutrients you need without too many calories. Decrease your intake of foods high in solid fats, added sugars, and salt. Eat the right amount of calories for you.Get information about a proper diet from your caregiver, if necessary.  Regular physical exercise is one of the most important things you can do for your health. Most adults should get at least 150 minutes of moderate-intensity exercise (any activity that increases your heart rate and causes you to sweat) each week. In addition, most adults need muscle-strengthening exercises on 2 or more days a week.  Maintain a healthy weight. The body mass index (BMI) is a screening tool to identify possible weight problems. It provides an estimate of body fat based on height and weight. Your caregiver can help determine your BMI, and can help you achieve or maintain a healthy weight.For adults 20 years and older:  A BMI below 18.5 is considered underweight.  A BMI of 18.5 to 24.9 is normal.  A BMI of 25 to 29.9 is considered overweight.  A BMI of 30 and above is  considered obese.  Maintain normal blood lipids and cholesterol levels by exercising and minimizing your intake of saturated fat. Eat a balanced diet with plenty of fruit and vegetables. Blood tests for lipids and cholesterol should begin at age 20 and be repeated every 5 years. If your lipid or cholesterol levels are high, you are over 50, or you are at high risk for heart disease, you may need your cholesterol levels checked more frequently.Ongoing high lipid and cholesterol levels should be treated with medicines if diet and exercise are not effective.  If you smoke, find out from your caregiver how to quit. If you do not use tobacco, do not start.  If you are pregnant, do not drink alcohol. If you are breastfeeding, be very cautious about drinking alcohol. If you are not pregnant and choose to drink alcohol, do not exceed 1 drink per day. One drink is considered to be 12 ounces (355 mL) of beer, 5 ounces (148 mL) of wine, or 1.5 ounces (44 mL) of liquor.  Avoid use of street drugs. Do not share needles with anyone. Ask for help if you need support or instructions about stopping the use of drugs.  High blood pressure causes heart disease and increases the risk of stroke. Your blood pressure should be checked at least every 1 to 2 years. Ongoing high blood pressure should be treated with medicines if weight loss and exercise are not effective.  If you are 55 to 54 years old, ask your caregiver if you should take aspirin to prevent strokes.  Diabetes   screening involves taking a blood sample to check your fasting blood sugar level. This should be done once every 3 years, after age 45, if you are within normal weight and without risk factors for diabetes. Testing should be considered at a younger age or be carried out more frequently if you are overweight and have at least 1 risk factor for diabetes.  Breast cancer screening is essential preventive care for women. You should practice "breast  self-awareness." This means understanding the normal appearance and feel of your breasts and may include breast self-examination. Any changes detected, no matter how small, should be reported to a caregiver. Women in their 20s and 30s should have a clinical breast exam (CBE) by a caregiver as part of a regular health exam every 1 to 3 years. After age 40, women should have a CBE every year. Starting at age 40, women should consider having a mammography (breast X-ray test) every year. Women who have a family history of breast cancer should talk to their caregiver about genetic screening. Women at a high risk of breast cancer should talk to their caregivers about having magnetic resonance imaging (MRI) and a mammography every year.  The Pap test is a screening test for cervical cancer. A Pap test can show cell changes on the cervix that might become cervical cancer if left untreated. A Pap test is a procedure in which cells are obtained and examined from the lower end of the uterus (cervix).  Women should have a Pap test starting at age 21.  Between ages 21 and 29, Pap tests should be repeated every 2 years.  Beginning at age 30, you should have a Pap test every 3 years as long as the past 3 Pap tests have been normal.  Some women have medical problems that increase the chance of getting cervical cancer. Talk to your caregiver about these problems. It is especially important to talk to your caregiver if a new problem develops soon after your last Pap test. In these cases, your caregiver may recommend more frequent screening and Pap tests.  The above recommendations are the same for women who have or have not gotten the vaccine for human papillomavirus (HPV).  If you had a hysterectomy for a problem that was not cancer or a condition that could lead to cancer, then you no longer need Pap tests. Even if you no longer need a Pap test, a regular exam is a good idea to make sure no other problems are  starting.  If you are between ages 65 and 70, and you have had normal Pap tests going back 10 years, you no longer need Pap tests. Even if you no longer need a Pap test, a regular exam is a good idea to make sure no other problems are starting.  If you have had past treatment for cervical cancer or a condition that could lead to cancer, you need Pap tests and screening for cancer for at least 20 years after your treatment.  If Pap tests have been discontinued, risk factors (such as a new sexual partner) need to be reassessed to determine if screening should be resumed.  The HPV test is an additional test that may be used for cervical cancer screening. The HPV test looks for the virus that can cause the cell changes on the cervix. The cells collected during the Pap test can be tested for HPV. The HPV test could be used to screen women aged 30 years and older, and should   be used in women of any age who have unclear Pap test results. After the age of 30, women should have HPV testing at the same frequency as a Pap test.  Colorectal cancer can be detected and often prevented. Most routine colorectal cancer screening begins at the age of 50 and continues through age 75. However, your caregiver may recommend screening at an earlier age if you have risk factors for colon cancer. On a yearly basis, your caregiver may provide home test kits to check for hidden blood in the stool. Use of a small camera at the end of a tube, to directly examine the colon (sigmoidoscopy or colonoscopy), can detect the earliest forms of colorectal cancer. Talk to your caregiver about this at age 50, when routine screening begins. Direct examination of the colon should be repeated every 5 to 10 years through age 75, unless early forms of pre-cancerous polyps or small growths are found.  Hepatitis C blood testing is recommended for all people born from 1945 through 1965 and any individual with known risks for hepatitis C.  Practice  safe sex. Use condoms and avoid high-risk sexual practices to reduce the spread of sexually transmitted infections (STIs). STIs include gonorrhea, chlamydia, syphilis, trichomonas, herpes, HPV, and human immunodeficiency virus (HIV). Herpes, HIV, and HPV are viral illnesses that have no cure. They can result in disability, cancer, and death. Sexually active women aged 25 and younger should be checked for chlamydia. Older women with new or multiple partners should also be tested for chlamydia. Testing for other STIs is recommended if you are sexually active and at increased risk.  Osteoporosis is a disease in which the bones lose minerals and strength with aging. This can result in serious bone fractures. The risk of osteoporosis can be identified using a bone density scan. Women ages 65 and over and women at risk for fractures or osteoporosis should discuss screening with their caregivers. Ask your caregiver whether you should take a calcium supplement or vitamin D to reduce the rate of osteoporosis.  Menopause can be associated with physical symptoms and risks. Hormone replacement therapy is available to decrease symptoms and risks. You should talk to your caregiver about whether hormone replacement therapy is right for you.  Use sunscreen with sun protection factor (SPF) of 30 or more. Apply sunscreen liberally and repeatedly throughout the day. You should seek shade when your shadow is shorter than you. Protect yourself by wearing long sleeves, pants, a wide-brimmed hat, and sunglasses year round, whenever you are outdoors.  Once a month, do a whole body skin exam, using a mirror to look at the skin on your back. Notify your caregiver of new moles, moles that have irregular borders, moles that are larger than a pencil eraser, or moles that have changed in shape or color.  Stay current with required immunizations.  Influenza. You need a dose every fall (or winter). The composition of the flu vaccine  changes each year, so being vaccinated once is not enough.  Pneumococcal polysaccharide. You need 1 to 2 doses if you smoke cigarettes or if you have certain chronic medical conditions. You need 1 dose at age 65 (or older) if you have never been vaccinated.  Tetanus, diphtheria, pertussis (Tdap, Td). Get 1 dose of Tdap vaccine if you are younger than age 65, are over 65 and have contact with an infant, are a healthcare worker, are pregnant, or simply want to be protected from whooping cough. After that, you need a Td   booster dose every 10 years. Consult your caregiver if you have not had at least 3 tetanus and diphtheria-containing shots sometime in your life or have a deep or dirty wound.  HPV. You need this vaccine if you are a woman age 26 or younger. The vaccine is given in 3 doses over 6 months.  Measles, mumps, rubella (MMR). You need at least 1 dose of MMR if you were born in 1957 or later. You may also need a second dose.  Meningococcal. If you are age 19 to 21 and a first-year college student living in a residence hall, or have one of several medical conditions, you need to get vaccinated against meningococcal disease. You may also need additional booster doses.  Zoster (shingles). If you are age 60 or older, you should get this vaccine.  Varicella (chickenpox). If you have never had chickenpox or you were vaccinated but received only 1 dose, talk to your caregiver to find out if you need this vaccine.  Hepatitis A. You need this vaccine if you have a specific risk factor for hepatitis A virus infection or you simply wish to be protected from this disease. The vaccine is usually given as 2 doses, 6 to 18 months apart.  Hepatitis B. You need this vaccine if you have a specific risk factor for hepatitis B virus infection or you simply wish to be protected from this disease. The vaccine is given in 3 doses, usually over 6 months. Preventive Services / Frequency Ages 19 to 39  Blood  pressure check.** / Every 1 to 2 years.  Lipid and cholesterol check.** / Every 5 years beginning at age 20.  Clinical breast exam.** / Every 3 years for women in their 20s and 30s.  Pap test.** / Every 2 years from ages 21 through 29. Every 3 years starting at age 30 through age 65 or 70 with a history of 3 consecutive normal Pap tests.  HPV screening.** / Every 3 years from ages 30 through ages 65 to 70 with a history of 3 consecutive normal Pap tests.  Hepatitis C blood test.** / For any individual with known risks for hepatitis C.  Skin self-exam. / Monthly.  Influenza immunization.** / Every year.  Pneumococcal polysaccharide immunization.** / 1 to 2 doses if you smoke cigarettes or if you have certain chronic medical conditions.  Tetanus, diphtheria, pertussis (Tdap, Td) immunization. / A one-time dose of Tdap vaccine. After that, you need a Td booster dose every 10 years.  HPV immunization. / 3 doses over 6 months, if you are 26 and younger.  Measles, mumps, rubella (MMR) immunization. / You need at least 1 dose of MMR if you were born in 1957 or later. You may also need a second dose.  Meningococcal immunization. / 1 dose if you are age 19 to 21 and a first-year college student living in a residence hall, or have one of several medical conditions, you need to get vaccinated against meningococcal disease. You may also need additional booster doses.  Varicella immunization.** / Consult your caregiver.  Hepatitis A immunization.** / Consult your caregiver. 2 doses, 6 to 18 months apart.  Hepatitis B immunization.** / Consult your caregiver. 3 doses usually over 6 months. Ages 40 to 64  Blood pressure check.** / Every 1 to 2 years.  Lipid and cholesterol check.** / Every 5 years beginning at age 20.  Clinical breast exam.** / Every year after age 40.  Mammogram.** / Every year beginning at age 40   and continuing for as long as you are in good health. Consult with your  caregiver.  Pap test.** / Every 3 years starting at age 30 through age 65 or 70 with a history of 3 consecutive normal Pap tests.  HPV screening.** / Every 3 years from ages 30 through ages 65 to 70 with a history of 3 consecutive normal Pap tests.  Fecal occult blood test (FOBT) of stool. / Every year beginning at age 50 and continuing until age 75. You may not need to do this test if you get a colonoscopy every 10 years.  Flexible sigmoidoscopy or colonoscopy.** / Every 5 years for a flexible sigmoidoscopy or every 10 years for a colonoscopy beginning at age 50 and continuing until age 75.  Hepatitis C blood test.** / For all people born from 1945 through 1965 and any individual with known risks for hepatitis C.  Skin self-exam. / Monthly.  Influenza immunization.** / Every year.  Pneumococcal polysaccharide immunization.** / 1 to 2 doses if you smoke cigarettes or if you have certain chronic medical conditions.  Tetanus, diphtheria, pertussis (Tdap, Td) immunization.** / A one-time dose of Tdap vaccine. After that, you need a Td booster dose every 10 years.  Measles, mumps, rubella (MMR) immunization. / You need at least 1 dose of MMR if you were born in 1957 or later. You may also need a second dose.  Varicella immunization.** / Consult your caregiver.  Meningococcal immunization.** / Consult your caregiver.  Hepatitis A immunization.** / Consult your caregiver. 2 doses, 6 to 18 months apart.  Hepatitis B immunization.** / Consult your caregiver. 3 doses, usually over 6 months. Ages 65 and over  Blood pressure check.** / Every 1 to 2 years.  Lipid and cholesterol check.** / Every 5 years beginning at age 20.  Clinical breast exam.** / Every year after age 40.  Mammogram.** / Every year beginning at age 40 and continuing for as long as you are in good health. Consult with your caregiver.  Pap test.** / Every 3 years starting at age 30 through age 65 or 70 with a 3  consecutive normal Pap tests. Testing can be stopped between 65 and 70 with 3 consecutive normal Pap tests and no abnormal Pap or HPV tests in the past 10 years.  HPV screening.** / Every 3 years from ages 30 through ages 65 or 70 with a history of 3 consecutive normal Pap tests. Testing can be stopped between 65 and 70 with 3 consecutive normal Pap tests and no abnormal Pap or HPV tests in the past 10 years.  Fecal occult blood test (FOBT) of stool. / Every year beginning at age 50 and continuing until age 75. You may not need to do this test if you get a colonoscopy every 10 years.  Flexible sigmoidoscopy or colonoscopy.** / Every 5 years for a flexible sigmoidoscopy or every 10 years for a colonoscopy beginning at age 50 and continuing until age 75.  Hepatitis C blood test.** / For all people born from 1945 through 1965 and any individual with known risks for hepatitis C.  Osteoporosis screening.** / A one-time screening for women ages 65 and over and women at risk for fractures or osteoporosis.  Skin self-exam. / Monthly.  Influenza immunization.** / Every year.  Pneumococcal polysaccharide immunization.** / 1 dose at age 65 (or older) if you have never been vaccinated.  Tetanus, diphtheria, pertussis (Tdap, Td) immunization. / A one-time dose of Tdap vaccine if you are over   65 and have contact with an infant, are a healthcare worker, or simply want to be protected from whooping cough. After that, you need a Td booster dose every 10 years.  Varicella immunization.** / Consult your caregiver.  Meningococcal immunization.** / Consult your caregiver.  Hepatitis A immunization.** / Consult your caregiver. 2 doses, 6 to 18 months apart.  Hepatitis B immunization.** / Check with your caregiver. 3 doses, usually over 6 months. ** Family history and personal history of risk and conditions may change your caregiver's recommendations. Document Released: 07/24/2001 Document Revised: 08/20/2011  Document Reviewed: 10/23/2010 [employer] Patient Information 2014 [employer].  

## 2013-02-25 NOTE — Progress Notes (Signed)
Subjective:     Karen Brown is a 52 y.o. female and is here for a comprehensive physical exam. The patient reports no problems.  History   Social History  . Marital Status: Married    Spouse Name: N/A    Number of Children: 0  . Years of Education: N/A   Occupational History  . Disabled     Social History Main Topics  . Smoking status: Never Smoker   . Smokeless tobacco: Never Used  . Alcohol Use: No  . Drug Use: No  . Sexual Activity: Not on file   Other Topics Concern  . Not on file   Social History Narrative   Caffeine occ    Health Maintenance  Topic Date Due  . Influenza Vaccine  01/09/2013  . Mammogram  08/01/2014  . Tetanus/tdap  01/22/2017  . Colonoscopy  12/10/2019    The following portions of the patient's history were reviewed and updated as appropriate:  She  has a past medical history of GERD (gastroesophageal reflux disease); Depression; Hypertension; Bipolar 1 disorder; Fibromyalgia; IBS (irritable bowel syndrome); Hypothyroidism; PVC (premature ventricular contraction); Colitis, ischemic; Diverticulosis; Anxiety; Hemorrhoids; and Hyperlipemia. She  does not have any pertinent problems on file. She  has past surgical history that includes Cholecystectomy; Abdominal hysterectomy; Tonsillectomy; Back surgery; Back surgery; and Appendectomy. Her family history includes COPD (age of onset: 61) in her father; Colon polyps in her mother; Coronary artery disease in her father; Heart disease in her father; Kidney disease in her father; Osteoporosis in her mother. There is no history of Colon cancer. She  reports that she has never smoked. She has never used smokeless tobacco. She reports that she does not drink alcohol or use illicit drugs. She has a current medication list which includes the following prescription(s): vitamin d-3, clidinium-chlordiazepoxide, clonazepam, cyclobenzaprine, estradiol, fentanyl, hydrocodone-acetaminophen, lamotrigine, levothyroxine,  lisinopril-hydrochlorothiazide, loperamide-simethicone, pristiq, promethazine, and apap-isometheptene-caffeine. Current Outpatient Prescriptions on File Prior to Visit  Medication Sig Dispense Refill  . Cholecalciferol (VITAMIN D-3) 5000 UNITS TABS Take 1 capsule by mouth daily.        . clidinium-chlordiazePOXIDE (LIBRAX) 2.5-5 MG per capsule TAKE ONE CAPSULE BY MOUTH 3 TIMES A DAY BEFORE MEALS AS NEEDED  90 capsule  2  . clonazePAM (KLONOPIN) 1 MG tablet Take 1 mg by mouth 3 (three) times daily as needed.        . cyclobenzaprine (FLEXERIL) 10 MG tablet 1 po qd  30 tablet  1  . fentaNYL (DURAGESIC - DOSED MCG/HR) 75 MCG/HR Place 1 patch onto the skin every other day.        Marland Kitchen HYDROcodone-acetaminophen (NORCO) 10-325 MG per tablet Take by mouth daily. Will take up five a day      . lamoTRIgine (LAMICTAL) 25 MG tablet Take 25 mg by mouth 2 (two) times daily. 1 in the morning and 1 in the evening      . lisinopril-hydrochlorothiazide (PRINZIDE,ZESTORETIC) 20-12.5 MG per tablet Take 1 tablet by mouth daily.        . Loperamide-Simethicone (IMODIUM ADVANCED PO) Take by mouth as needed.        Marland Kitchen PRISTIQ 100 MG 24 hr tablet Take 1 tablet by mouth daily.        No current facility-administered medications on file prior to visit.   She is allergic to belladonna alk-phenobarbital; biaxin; ketorolac tromethamine; penicillins; phenobarbital; sulfonamide derivatives; topiramate; and zaleplon..  Review of Systems Review of Systems  Constitutional: Negative for activity change, appetite change and  fatigue.  HENT: Negative for hearing loss, congestion, tinnitus and ear discharge.  dentist q43m Eyes: Negative for visual disturbance (see optho q1y -- vision corrected to 20/20 with glasses).  Respiratory: Negative for cough, chest tightness and shortness of breath.   Cardiovascular: Negative for chest pain, palpitations and leg swelling.  Gastrointestinal: Negative for abdominal pain, diarrhea, constipation  and abdominal distention.  Genitourinary: Negative for urgency, frequency, decreased urine volume and difficulty urinating.  Musculoskeletal: Negative for back pain, arthralgias and gait problem.  Skin: Negative for color change, pallor and rash.  Neurological: Negative for dizziness, light-headedness, numbness and headaches.  Hematological: Negative for adenopathy. Does not bruise/bleed easily.  Psychiatric/Behavioral: Negative for suicidal ideas, confusion, sleep disturbance, self-injury, dysphoric mood, decreased concentration and agitation.       Objective:    BP 112/72  Pulse 83  Temp(Src) 98.5 F (36.9 C) (Oral)  Ht 5' 5.5" (1.664 m)  Wt 212 lb 6.4 oz (96.344 kg)  BMI 34.8 kg/m2  SpO2 96% General appearance: alert, cooperative, appears stated age and no distress Head: Normocephalic, without obvious abnormality, atraumatic Eyes: conjunctivae/corneas clear. PERRL, EOM's intact. Fundi benign. Ears: normal TM's and external ear canals both ears Nose: Nares normal. Septum midline. Mucosa normal. No drainage or sinus tenderness. Throat: lips, mucosa, and tongue normal; teeth and gums normal Neck: no adenopathy, no carotid bruit, no JVD, supple, symmetrical, trachea midline and thyroid not enlarged, symmetric, no tenderness/mass/nodules Back: symmetric, no curvature. ROM normal. No CVA tenderness. Lungs: clear to auscultation bilaterally Breasts: gyn Heart: regular rate and rhythm, S1, S2 normal, no murmur, click, rub or gallop Abdomen: soft, non-tender; bowel sounds normal; no masses,  no organomegaly Pelvic: deferred--gyn Extremities: extremities normal, atraumatic, no cyanosis or edema Pulses: 2+ and symmetric Skin: Skin color, texture, turgor normal. No rashes or lesions Lymph nodes: Cervical, supraclavicular, and axillary nodes normal. Neurologic: Alert and oriented X 3, normal strength and tone. Normal symmetric reflexes. Normal coordination and gait psych-- no  depression, no anxiety    Assessment:    Healthy female exam.      Plan:  ghm utd Check labs    See After Visit Summary for Counseling Recommendations

## 2013-03-05 MED ORDER — PITAVASTATIN CALCIUM 2 MG PO TABS: 1.0000 | ORAL_TABLET | Freq: Every day | ORAL | Status: DC

## 2013-03-16 ENCOUNTER — Inpatient Hospital Stay: Admission: RE | Admit: 2013-03-16 | Payer: Medicare Other | Source: Ambulatory Visit

## 2013-03-17 ENCOUNTER — Ambulatory Visit (INDEPENDENT_AMBULATORY_CARE_PROVIDER_SITE_OTHER)
Admission: RE | Admit: 2013-03-17 | Discharge: 2013-03-17 | Disposition: A | Discharge status: Home / Self Care | Payer: Medicare Other | Source: Ambulatory Visit | Attending: Family Medicine | Admitting: Family Medicine

## 2013-03-17 DIAGNOSIS — E2839 Other primary ovarian failure: Secondary | ICD-10-CM

## 2013-04-07 ENCOUNTER — Ambulatory Visit (INDEPENDENT_AMBULATORY_CARE_PROVIDER_SITE_OTHER): Payer: Medicare Other | Admitting: General Practice

## 2013-04-07 DIAGNOSIS — Z23 Encounter for immunization: Secondary | ICD-10-CM

## 2013-08-28 ENCOUNTER — Other Ambulatory Visit: Payer: Self-pay

## 2013-08-28 DIAGNOSIS — Z1231 Encounter for screening mammogram for malignant neoplasm of breast: Secondary | ICD-10-CM

## 2013-10-01 ENCOUNTER — Ambulatory Visit
Admission: RE | Admit: 2013-10-01 | Discharge: 2013-10-01 | Disposition: A | Discharge status: Home / Self Care | Payer: Self-pay | Source: Ambulatory Visit

## 2013-10-01 ENCOUNTER — Encounter (INDEPENDENT_AMBULATORY_CARE_PROVIDER_SITE_OTHER): Payer: Self-pay

## 2013-10-01 DIAGNOSIS — Z1231 Encounter for screening mammogram for malignant neoplasm of breast: Secondary | ICD-10-CM

## 2013-10-09 ENCOUNTER — Other Ambulatory Visit: Payer: Self-pay | Admitting: Internal Medicine

## 2013-11-26 ENCOUNTER — Other Ambulatory Visit: Payer: Self-pay | Admitting: Family Medicine

## 2013-12-01 ENCOUNTER — Other Ambulatory Visit: Payer: Self-pay | Admitting: Family Medicine

## 2014-03-26 ENCOUNTER — Ambulatory Visit (INDEPENDENT_AMBULATORY_CARE_PROVIDER_SITE_OTHER): Payer: Medicare Other | Admitting: *Deleted

## 2014-03-26 DIAGNOSIS — Z23 Encounter for immunization: Secondary | ICD-10-CM

## 2014-08-09 ENCOUNTER — Other Ambulatory Visit: Payer: Self-pay

## 2014-08-09 MED ORDER — ISOMETHEPTENE-CAFFEINE-APAP 65-20-325 MG PO TABS: ORAL_TABLET | ORAL | Status: DC

## 2014-08-09 NOTE — Telephone Encounter (Signed)
Last seen 02/25/13 and filled 11/26/13 #90  No pending apt.   Please advise     KP

## 2014-09-22 ENCOUNTER — Other Ambulatory Visit: Payer: Self-pay

## 2014-09-22 DIAGNOSIS — Z1231 Encounter for screening mammogram for malignant neoplasm of breast: Secondary | ICD-10-CM

## 2014-10-07 ENCOUNTER — Encounter (INDEPENDENT_AMBULATORY_CARE_PROVIDER_SITE_OTHER): Payer: Self-pay

## 2014-10-07 ENCOUNTER — Ambulatory Visit
Admission: RE | Admit: 2014-10-07 | Discharge: 2014-10-07 | Disposition: A | Discharge status: Home / Self Care | Payer: Medicare Other | Source: Ambulatory Visit

## 2014-10-07 DIAGNOSIS — Z1231 Encounter for screening mammogram for malignant neoplasm of breast: Secondary | ICD-10-CM

## 2014-12-01 ENCOUNTER — Ambulatory Visit (INDEPENDENT_AMBULATORY_CARE_PROVIDER_SITE_OTHER): Payer: Medicare Other | Admitting: Physician Assistant

## 2014-12-01 ENCOUNTER — Encounter: Payer: Self-pay | Admitting: Physician Assistant

## 2014-12-01 VITALS — BP 128/72 | HR 72 | Temp 98.7°F | Resp 18 | Ht 65.0 in | Wt 209.8 lb

## 2014-12-01 DIAGNOSIS — R399 Unspecified symptoms and signs involving the genitourinary system: Secondary | ICD-10-CM

## 2014-12-01 LAB — POCT URINALYSIS DIPSTICK
Bilirubin, UA: NEGATIVE
Blood, UA: NEGATIVE
GLUCOSE UA: NEGATIVE
KETONES UA: NEGATIVE
Leukocytes, UA: NEGATIVE
NITRITE UA: NEGATIVE
Protein, UA: NEGATIVE
Spec Grav, UA: 1.015
Urobilinogen, UA: NEGATIVE
pH, UA: 6

## 2014-12-01 MED ORDER — CIPROFLOXACIN HCL 250 MG PO TABS: 250.0000 mg | ORAL_TABLET | Freq: Two times a day (BID) | ORAL | Status: DC

## 2014-12-01 NOTE — Assessment & Plan Note (Signed)
Classic symptoms with positive history.  Urine dip unremarkable.  Will send for culture. Giving patient going out of town for loss of loved one will start Cipro 250 mg BID until culture results.  Hold Zanaflex while on this medication.  Supportive measures reviewed and handout given.

## 2014-12-01 NOTE — Progress Notes (Signed)
History of Present Illness: Karen Brown is a 54 y.o. female who present to the clinic today complaining of urinary frequency, urinary urgency and dysuria x 2-3 days.  Patient endorses malaise.  Patient denies fever, chills, nausea/vomiting and flank pain. Patient with + history of UTI.   History: Past Medical History  Diagnosis Date  . GERD (gastroesophageal reflux disease)   . Depression   . Hypertension   . Bipolar 1 disorder   . Fibromyalgia   . IBS (irritable bowel syndrome)   . Hypothyroidism   . PVC (premature ventricular contraction)   . Colitis, ischemic   . Diverticulosis   . Anxiety   . Hemorrhoids   . Hyperlipemia     Current outpatient prescriptions:  .  Cholecalciferol (VITAMIN D-3) 5000 UNITS TABS, Take 1 capsule by mouth daily.  , Disp: , Rfl:  .  clonazePAM (KLONOPIN) 1 MG tablet, Take 1 mg by mouth 3 (three) times daily as needed.  , Disp: , Rfl:  .  fentaNYL (DURAGESIC - DOSED MCG/HR) 75 MCG/HR, Place 1 patch onto the skin every other day.  , Disp: , Rfl:  .  HYDROcodone-acetaminophen (NORCO) 10-325 MG per tablet, Take by mouth daily. Will take up five a day, Disp: , Rfl:  .  Isometheptene-Caffeine-APAP 65-20-325 MG TABS, 1 tablet by mouth daily as directed--office visit due now, Disp: 90 tablet, Rfl: 0 .  lamoTRIgine (LAMICTAL) 25 MG tablet, Take 25 mg by mouth 2 (two) times daily. 1 in the morning and 1 in the evening, Disp: , Rfl:  .  levothyroxine (SYNTHROID, LEVOTHROID) 137 MCG tablet, Take 125 mcg by mouth daily before breakfast. , Disp: , Rfl:  .  lisinopril (PRINIVIL,ZESTRIL) 10 MG tablet, Take 10 mg by mouth daily., Disp: , Rfl:  .  PRISTIQ 100 MG 24 hr tablet, Take 1 tablet by mouth daily. , Disp: , Rfl:  .  promethazine (PHENERGAN) 25 MG tablet, TAKE ONE TABLET BY MOUTH AS NEEDED, Disp: 90 tablet, Rfl: 0 .  tiZANidine (ZANAFLEX) 4 MG capsule, Take 4 mg by mouth 3 (three) times daily as needed for muscle spasms., Disp: , Rfl:  .  ciprofloxacin  (CIPRO) 250 MG tablet, Take 1 tablet (250 mg total) by mouth 2 (two) times daily., Disp: 6 tablet, Rfl: 0 .  clidinium-chlordiazePOXIDE (LIBRAX) 2.5-5 MG per capsule, TAKE ONE CAPSULE BY MOUTH 3 TIMES A DAY BEFORE MEALS AS NEEDED (Patient not taking: Reported on 12/01/2014), Disp: 90 capsule, Rfl: 2 .  estradiol (VIVELLE-DOT) 0.075 MG/24HR, Place 1 patch onto the skin 2 (two) times a week., Disp: , Rfl:  .  Loperamide-Simethicone (IMODIUM ADVANCED PO), Take by mouth as needed.  , Disp: , Rfl:  .  Pitavastatin Calcium 2 MG TABS, Take 1 tablet (2 mg total) by mouth daily. (Patient not taking: Reported on 12/01/2014), Disp: 14 tablet, Rfl: 0 Allergies  Allergen Reactions  . Biaxin [Clarithromycin] Other (See Comments)    Bad taste and ringing in ear with dec hearing  . Ketorolac Tromethamine   . Pb-Hyoscy-Atropine-Scopolamine   . Penicillins     REACTION: HIVES  . Phenobarbital   . Sulfonamide Derivatives   . Topiramate   . Zaleplon    Family History  Problem Relation Age of Onset  . Kidney disease Father   . Coronary artery disease Father   . COPD Father 61    copd  . Heart disease Father   . Colon polyps Mother   . Osteoporosis Mother   .  Colon cancer Neg Hx    History   Social History  . Marital Status: Married    Spouse Name: N/A  . Number of Children: 0  . Years of Education: N/A   Occupational History  . Disabled     Social History Main Topics  . Smoking status: Never Smoker   . Smokeless tobacco: Never Used  . Alcohol Use: No  . Drug Use: No  . Sexual Activity: Not on file   Other Topics Concern  . Not on file   Social History Narrative   Caffeine occ    Review of Systems: See HPI.  All other ROS are negative.  Physical Examination: BP 128/72 mmHg  Pulse 72  Temp(Src) 98.7 F (37.1 C) (Oral)  Resp 18  Ht 5\' 5"  (1.651 m)  Wt 209 lb 12.8 oz (95.165 kg)  BMI 34.91 kg/m2  SpO2 98%  General appearance: alert, cooperative, appears stated age and no  distress Head: Normocephalic, without obvious abnormality, atraumatic Lungs: clear to auscultation bilaterally Heart: regular rate and rhythm, S1, S2 normal, no murmur, click, rub or gallop Abdomen: soft, non-tender; bowel sounds normal; no masses,  no organomegaly and no CVA tenderness.  Labs/Diagnostics: Urinalysis    Component Value Date/Time   COLORURINE yellow 03/15/2010 1125   APPEARANCEUR Clear 03/15/2010 1125   LABSPEC 1.015 03/15/2010 1125   PHURINE 5.0 03/15/2010 1125   GLUCOSEU NEGATIVE 05/23/2009 0426   HGBUR negative 03/15/2010 1125   HGBUR SMALL* 05/23/2009 0426   BILIRUBINUR Neg 12/01/2014 1026   BILIRUBINUR negative 03/15/2010 1125   KETONESUR NEGATIVE 05/23/2009 0426   PROTEINUR Neg 12/01/2014 1026   PROTEINUR NEGATIVE 05/23/2009 0426   UROBILINOGEN negative 12/01/2014 1026   UROBILINOGEN 0.2 03/15/2010 1125   NITRITE Neg 12/01/2014 1026   NITRITE negative 03/15/2010 1125   LEUKOCYTESUR Negative 12/01/2014 1026   Assessment/Plan: UTI symptoms Classic symptoms with positive history.  Urine dip unremarkable.  Will send for culture. Giving patient going out of town for loss of loved one will start Cipro 250 mg BID until culture results.  Hold Zanaflex while on this medication.  Supportive measures reviewed and handout given.

## 2014-12-01 NOTE — Patient Instructions (Addendum)
Your symptoms are consistent with a bladder infection, also called acute cystitis. Please take your antibiotic (Cipro) as directed until all pills are gone.  DO NOT TAKE ZANAFLEX WHILE ON THIS ANTIBIOTIC. Stay very well hydrated.  Consider a daily probiotic (Align, Culturelle, or Activia) to help prevent stomach upset caused by the antibiotic.  Taking a probiotic daily may also help prevent recurrent UTIs.  Also consider taking AZO (Phenazopyridine) tablets to help decrease pain with urination.  I will call you with your urine testing results.  We will change antibiotics if indicated.  Call or return to clinic if symptoms are not resolved by completion of antibiotic.   Urinary Tract Infection A urinary tract infection (UTI) can occur any place along the urinary tract. The tract includes the kidneys, ureters, bladder, and urethra. A type of germ called bacteria often causes a UTI. UTIs are often helped with antibiotic medicine.  HOME CARE   If given, take antibiotics as told by your doctor. Finish them even if you start to feel better.  Drink enough fluids to keep your pee (urine) clear or pale yellow.  Avoid tea, drinks with caffeine, and bubbly (carbonated) drinks.  Pee often. Avoid holding your pee in for a long time.  Pee before and after having sex (intercourse).  Wipe from front to back after you poop (bowel movement) if you are a woman. Use each tissue only once. GET HELP RIGHT AWAY IF:   You have back pain.  You have lower belly (abdominal) pain.  You have chills.  You feel sick to your stomach (nauseous).  You throw up (vomit).  Your burning or discomfort with peeing does not go away.  You have a fever.  Your symptoms are not better in 3 days. MAKE SURE YOU:   Understand these instructions.  Will watch your condition.  Will get help right away if you are not doing well or get worse. Document Released: 11/14/2007 Document Revised: 02/20/2012 Document Reviewed:  12/27/2011 [facility] Patient Information 2015 [place], [place]. This information is not intended to replace advice given to you by your health care provider. Make sure you discuss any questions you have with your health care provider.

## 2014-12-04 LAB — CULTURE, URINE COMPREHENSIVE: Colony Count: >=100000

## 2015-01-05 ENCOUNTER — Telehealth: Payer: Self-pay | Admitting: Family Medicine

## 2015-01-05 NOTE — Telephone Encounter (Signed)
Caller name: Louana Relation to pt: Self   Reason for call: Pt called stating had come in office 4 wks ago and was seen by Einar Pheasant for Uti infection, pt states was prescribed cypro for 7 days, pt did not get better and was again giving 7 day cypro, pt states has a condition that gives her diahrria and at this moment is having to go to the bathroom very bad, (pt states has a very bad smell), pt was wanting to get a stool kit to verify what is going on. Pt was informed was needed to be seen by provider. Since pt states can't come to office is wanting to know if possible to have it done before. Pt call was transferred to [employer].

## 2015-01-06 NOTE — Telephone Encounter (Signed)
noted 

## 2015-01-06 NOTE — Telephone Encounter (Signed)
Late Entry:  01/05/15 @ 5:10pm   Spoke with patient who stated that after seeing Cody on 12/01/14, she went to see her OB-GYN for a totally separate issue.  She told OB-GYN  about her UTI and the doctor increased her dose of Cipro to 500 mg.  For the past 2 weeks now she has been experiencing diarrhea on and off, and over the past few days symptom has worsen.  She is going more frequently and her stools smell really bad.  She is unable to leave home to due fear of soiling herself, therefore she is asking for [facility] to order a stool sample.  Her mother is already here in the office to pick up kit.  Spoke with Einar Pheasant, who states to order stool sample kit for stool culture, O&P, and C-Diff.  He also recommended hydration, Imodium, and a follow up appointment.  The same was discussed with patient. Pt stated understanding and agreed with plan.  Stool kit was given to mother (who stated that no instructions were needed as daughter was familiar with how to collect stool sample), future orders were placed in chart, and follow up appointment was scheduled with Dr. Etter Sjogren on Friday (01/07/15) at 8:15 am per patient's request.

## 2015-01-07 ENCOUNTER — Encounter: Payer: Self-pay | Admitting: Family Medicine

## 2015-01-07 ENCOUNTER — Ambulatory Visit (INDEPENDENT_AMBULATORY_CARE_PROVIDER_SITE_OTHER): Payer: Medicare Other | Admitting: Family Medicine

## 2015-01-07 VITALS — BP 122/74 | HR 84 | Temp 98.5°F | Ht 65.0 in | Wt 207.8 lb

## 2015-01-07 DIAGNOSIS — R197 Diarrhea, unspecified: Secondary | ICD-10-CM

## 2015-01-07 MED ORDER — CILIDINIUM-CHLORDIAZEPOXIDE 2.5-5 MG PO CAPS: ORAL_CAPSULE | ORAL | Status: DC

## 2015-01-07 MED ORDER — METRONIDAZOLE 250 MG PO TABS: 250.0000 mg | ORAL_TABLET | Freq: Two times a day (BID) | ORAL | Status: DC

## 2015-01-07 NOTE — Progress Notes (Signed)
Patient ID: Karen Brown, female    DOB: 11-28-1960  Age: 54 y.o. MRN: [id number]    Subjective:  Subjective HPI DANELLY HASSINGER presents for f/u from diarrhea.  It has resolved by GI had given her flagyl and librax.    Review of Systems  Constitutional: Negative for diaphoresis, appetite change, fatigue and unexpected weight change.  Eyes: Negative for pain, redness and visual disturbance.  Respiratory: Negative for cough, chest tightness, shortness of breath and wheezing.   Cardiovascular: Negative for chest pain, palpitations and leg swelling.  Endocrine: Negative for cold intolerance, heat intolerance, polydipsia, polyphagia and polyuria.  Genitourinary: Negative for dysuria, frequency and difficulty urinating.  Neurological: Negative for dizziness, light-headedness, numbness and headaches.    History Past Medical History  Diagnosis Date  . GERD (gastroesophageal reflux disease)   . Depression   . Hypertension   . Bipolar 1 disorder   . Fibromyalgia   . IBS (irritable bowel syndrome)   . Hypothyroidism   . PVC (premature ventricular contraction)   . Colitis, ischemic   . Diverticulosis   . Anxiety   . Hemorrhoids   . Hyperlipemia     She has past surgical history that includes Cholecystectomy; Abdominal hysterectomy; Tonsillectomy; Back surgery; Back surgery; and Appendectomy.   Her family history includes COPD (age of onset: 64) in her father; Colon polyps in her mother; Coronary artery disease in her father; Heart disease in her father; Kidney disease in her father; Osteoporosis in her mother. There is no history of Colon cancer.She reports that she has never smoked. She has never used smokeless tobacco. She reports that she does not drink alcohol or use illicit drugs.  Current Outpatient Prescriptions on File Prior to Visit  Medication Sig Dispense Refill  . Cholecalciferol (VITAMIN D-3) 5000 UNITS TABS Take 1 capsule by mouth daily.      . clonazePAM (KLONOPIN) 1 MG  tablet Take 1 mg by mouth 3 (three) times daily as needed.      Marland Kitchen estradiol (VIVELLE-DOT) 0.075 MG/24HR Place 1 patch onto the skin once a week.     . fentaNYL (DURAGESIC - DOSED MCG/HR) 75 MCG/HR Place 1 patch onto the skin every other day.      Marland Kitchen HYDROcodone-acetaminophen (NORCO) 10-325 MG per tablet Take by mouth daily. Will take up five a day    . lamoTRIgine (LAMICTAL) 25 MG tablet Take 25 mg by mouth 2 (two) times daily. 1 in the morning and 1 in the evening    . lisinopril (PRINIVIL,ZESTRIL) 10 MG tablet Take 10 mg by mouth daily.    . Loperamide-Simethicone (IMODIUM ADVANCED PO) Take by mouth as needed.      Marland Kitchen PRISTIQ 100 MG 24 hr tablet Take 1 tablet by mouth daily.     . promethazine (PHENERGAN) 25 MG tablet TAKE ONE TABLET BY MOUTH AS NEEDED 90 tablet 0  . tiZANidine (ZANAFLEX) 4 MG capsule Take 4 mg by mouth 3 (three) times daily as needed for muscle spasms.     No current facility-administered medications on file prior to visit.     Objective:  Objective Physical Exam  Constitutional: She is oriented to person, place, and time. She appears well-developed and well-nourished.  HENT:  Head: Normocephalic and atraumatic.  Eyes: Conjunctivae and EOM are normal.  Neck: Normal range of motion. Neck supple. No JVD present. Carotid bruit is not present. No thyromegaly present.  Cardiovascular: Normal rate, regular rhythm and normal heart sounds.   No murmur heard.  Pulmonary/Chest: Effort normal and breath sounds normal. No respiratory distress. She has no wheezes. She has no rales. She exhibits no tenderness.  Musculoskeletal: She exhibits no edema.  Neurological: She is alert and oriented to person, place, and time.  Psychiatric: She has a normal mood and affect. Her behavior is normal.   BP 122/74 mmHg  Pulse 84  Temp(Src) 98.5 F (36.9 C) (Oral)  Ht 5\' 5"  (1.651 m)  Wt 207 lb 12.8 oz (94.257 kg)  BMI 34.58 kg/m2  SpO2 97% Wt Readings from Last 3 Encounters:  01/07/15 207  lb 12.8 oz (94.257 kg)  12/01/14 209 lb 12.8 oz (95.165 kg)  02/25/13 212 lb 6.4 oz (96.344 kg)     Lab Results  Component Value Date   WBC 8.2 02/25/2013   HGB 14.5 02/25/2013   HCT 42.7 02/25/2013   PLT 314.0 02/25/2013   GLUCOSE 94 02/25/2013   CHOL 343* 02/25/2013   TRIG 338.0* 02/25/2013   HDL 49.40 02/25/2013   LDLDIRECT 236.2 02/25/2013   ALT 32 02/25/2013   AST 27 02/25/2013   NA 137 02/25/2013   K 4.3 02/25/2013   CL 99 02/25/2013   CREATININE 0.9 02/25/2013   BUN 12 02/25/2013   CO2 30 02/25/2013   TSH 0.82 02/10/2010   HGBA1C 5.4 08/15/2007    Mm Screening Breast Tomo Bilateral  10/08/2014   CLINICAL DATA:  Screening.  EXAM: DIGITAL SCREENING BILATERAL MAMMOGRAM WITH 3D TOMO WITH CAD  COMPARISON:  Previous exam(s).  ACR Breast Density Category b: There are scattered areas of fibroglandular density.  FINDINGS: There are no findings suspicious for malignancy. Images were processed with CAD.  IMPRESSION: No mammographic evidence of malignancy. A result letter of this screening mammogram will be mailed directly to the patient.  RECOMMENDATION: Screening mammogram in one year. (Code:SM-B-01Y)  BI-RADS CATEGORY  1: Negative.   Electronically Signed   By: Lajean Manes M.D.   On: 10/08/2014 10:36     Assessment & Plan:  Plan I have discontinued Ms. Jonsson's levothyroxine, Pitavastatin Calcium, Isometheptene-Caffeine-APAP, and ciprofloxacin. I have also changed her clidinium-chlordiazePOXIDE. Additionally, I am having her start on metroNIDAZOLE. Lastly, I am having her maintain her lamoTRIgine, HYDROcodone-acetaminophen, PRISTIQ, clonazePAM, Vitamin D-3, fentaNYL, Loperamide-Simethicone (IMODIUM ADVANCED PO), estradiol, promethazine, tiZANidine, lisinopril, lisinopril-hydrochlorothiazide, SYNTHROID, and isometheptene-acetaminophen-dichloralphenazone.  Meds ordered this encounter  Medications  . lisinopril-hydrochlorothiazide (PRINZIDE,ZESTORETIC) 20-12.5 MG per tablet    Sig:  Take 1 tablet by mouth daily.  Marland Kitchen SYNTHROID 125 MCG tablet    Sig: Take 1 tablet by mouth daily.    Refill:  0  . isometheptene-acetaminophen-dichloralphenazone (MIDRIN) 65-100-325 MG capsule    Sig: Take 2 capsules by mouth 4 (four) times daily as needed for migraine. Maximum 5 capsules in 12 hours for migraine headaches, 8 capsules in 24 hours for tension headaches.  . clidinium-chlordiazePOXIDE (LIBRAX) 5-2.5 MG per capsule    Sig: TAKE ONE CAPSULE BY MOUTH 3 TIMES A DAY BEFORE MEALS AS NEEDED    Dispense:  90 capsule    Refill:  2  . metroNIDAZOLE (FLAGYL) 250 MG tablet    Sig: Take 1 tablet (250 mg total) by mouth 2 (two) times daily.    Dispense:  20 tablet    Refill:  0    Problem List Items Addressed This Visit    None    Visit Diagnoses    Diarrhea    -  Primary    Relevant Medications    clidinium-chlordiazePOXIDE (LIBRAX) 5-2.5 MG per capsule  metroNIDAZOLE (FLAGYL) 250 MG tablet    Other Relevant Orders    Stool Culture    Ova and parasite examination    Stool C-Diff Toxin Assay       Follow-up: Return if symptoms worsen or fail to improve.  Garnet Koyanagi, DO

## 2015-01-07 NOTE — Patient Instructions (Signed)
Irritable Bowel Syndrome Irritable bowel syndrome (IBS) is caused by a disturbance of normal bowel function and is a common digestive disorder. You may also hear this condition called spastic colon, mucous colitis, and irritable colon. There is no cure for IBS. However, symptoms often gradually improve or disappear with a good diet, stress management, and medicine. This condition usually appears in late adolescence or early adulthood. Women develop it twice as often as men. CAUSES  After food has been digested and absorbed in the small intestine, waste material is moved into the large intestine, or colon. In the colon, water and salts are absorbed from the undigested products coming from the small intestine. The remaining residue, or fecal material, is held for elimination. Under normal circumstances, gentle, rhythmic contractions of the bowel walls push the fecal material along the colon toward the rectum. In IBS, however, these contractions are irregular and poorly coordinated. The fecal material is either retained too long, resulting in constipation, or expelled too soon, producing diarrhea. SIGNS AND SYMPTOMS  The most common symptom of IBS is abdominal pain. It is often in the lower left side of the abdomen, but it may occur anywhere in the abdomen. The pain comes from spasms of the bowel muscles happening too much and from the buildup of gas and fecal material in the colon. This pain:  Can range from sharp abdominal cramps to a dull, continuous ache.  Often worsens soon after eating.  Is often relieved by having a bowel movement or passing gas. Abdominal pain is usually accompanied by constipation, but it may also produce diarrhea. The diarrhea often occurs right after a meal or upon waking up in the morning. The stools are often soft, watery, and flecked with mucus. Other symptoms of IBS include:  Bloating.  Loss of appetite.  Heartburn.  Backache.  Dull pain in the arms or  shoulders.  Nausea.  Burping.  Vomiting.  Gas. IBS may also cause symptoms that are unrelated to the digestive system, such as:  Fatigue.  Headaches.  Anxiety.  Shortness of breath.  Trouble concentrating.  Dizziness. These symptoms tend to come and go. DIAGNOSIS  The symptoms of IBS may seem like symptoms of other, more serious digestive disorders. Your health care provider may want to perform tests to exclude these disorders.  TREATMENT Many medicines are available to help correct bowel function or relieve bowel spasms and abdominal pain. Among the medicines available are:  Laxatives for severe constipation and to help restore normal bowel habits.  Specific antidiarrheal medicines to treat severe or lasting diarrhea.  Antispasmodic agents to relieve intestinal cramps. Your health care provider may also decide to treat you with a mild tranquilizer or sedative during unusually stressful periods in your life. Your health care provider may also prescribe antidepressant medicine. The use of this medicine has been shown to reduce pain and other symptoms of IBS. Remember that if any medicine is prescribed for you, you should take it exactly as directed. Make sure your health care provider knows how well it worked for you. HOME CARE INSTRUCTIONS   Take all medicines as directed by your health care provider.  Avoid foods that are high in fat or oils, such as heavy cream, butter, frankfurters, sausage, and other fatty meats.  Avoid foods that make you go to the bathroom, such as fruit, fruit juice, and dairy products.  Cut out carbonated drinks, chewing gum, and "gassy" foods such as beans and cabbage. This may help relieve bloating and burping.    Eat foods with bran, and drink plenty of liquids with the bran foods. This helps relieve constipation.  Keep track of what foods seem to bring on your symptoms.  Avoid emotionally charged situations or circumstances that produce  anxiety.  Start or continue exercising.  Get plenty of rest and sleep. Document Released: 05/28/2005 Document Revised: 06/02/2013 Document Reviewed: 01/16/2008 [employer] Patient Information 2015 [employer]. This information is not intended to replace advice given to you by your health care provider. Make sure you discuss any questions you have with your health care provider.  

## 2015-01-07 NOTE — Progress Notes (Signed)
Pre visit review using our clinic review tool, if applicable. No additional management support is needed unless otherwise documented below in the visit note. 

## 2015-01-13 ENCOUNTER — Ambulatory Visit: Payer: Medicare Other | Admitting: Family Medicine

## 2015-03-15 ENCOUNTER — Telehealth: Payer: Self-pay | Admitting: Family Medicine

## 2015-03-15 ENCOUNTER — Ambulatory Visit: Payer: Medicare Other | Admitting: Family Medicine

## 2015-03-15 NOTE — Telephone Encounter (Addendum)
Patient canceled 6 month follow up for today and [facility] to 03/22/15. Patient is sick charge or no charge

## 2015-03-15 NOTE — Telephone Encounter (Signed)
No charge. 

## 2015-03-22 ENCOUNTER — Ambulatory Visit (INDEPENDENT_AMBULATORY_CARE_PROVIDER_SITE_OTHER): Payer: Medicare Other

## 2015-03-22 DIAGNOSIS — Z23 Encounter for immunization: Secondary | ICD-10-CM | POA: Diagnosis not present

## 2015-06-27 ENCOUNTER — Ambulatory Visit (INDEPENDENT_AMBULATORY_CARE_PROVIDER_SITE_OTHER): Payer: Medicare Other | Admitting: Family Medicine

## 2015-06-27 ENCOUNTER — Encounter: Payer: Self-pay | Admitting: Family Medicine

## 2015-06-27 VITALS — BP 128/90 | HR 56 | Temp 98.1°F | Ht 66.0 in | Wt 208.8 lb

## 2015-06-27 DIAGNOSIS — Z0000 Encounter for general adult medical examination without abnormal findings: Secondary | ICD-10-CM | POA: Diagnosis not present

## 2015-06-27 DIAGNOSIS — E785 Hyperlipidemia, unspecified: Secondary | ICD-10-CM | POA: Diagnosis not present

## 2015-06-27 LAB — POCT URINALYSIS DIPSTICK
Bilirubin, UA: NEGATIVE
Blood, UA: NEGATIVE
Glucose, UA: NEGATIVE
KETONES UA: NEGATIVE
LEUKOCYTES UA: NEGATIVE
Nitrite, UA: NEGATIVE
PROTEIN UA: NEGATIVE
Spec Grav, UA: 1.025
Urobilinogen, UA: 0.2
pH, UA: 6

## 2015-06-27 NOTE — Patient Instructions (Signed)
Preventive Care for Adults, Female A healthy lifestyle and preventive care can promote health and wellness. Preventive health guidelines for women include the following key practices.  A routine yearly physical is a good way to check with your health care provider about your health and preventive screening. It is a chance to share any concerns and updates on your health and to receive a thorough exam.  Visit your dentist for a routine exam and preventive care every 6 months. Brush your teeth twice a day and floss once a day. Good oral hygiene prevents tooth decay and gum disease.  The frequency of eye exams is based on your age, health, family medical history, use of contact lenses, and other factors. Follow your health care provider's recommendations for frequency of eye exams.  Eat a healthy diet. Foods like vegetables, fruits, whole grains, low-fat dairy products, and lean protein foods contain the nutrients you need without too many calories. Decrease your intake of foods high in solid fats, added sugars, and salt. Eat the right amount of calories for you.Get information about a proper diet from your health care provider, if necessary.  Regular physical exercise is one of the most important things you can do for your health. Most adults should get at least 150 minutes of moderate-intensity exercise (any activity that increases your heart rate and causes you to sweat) each week. In addition, most adults need muscle-strengthening exercises on 2 or more days a week.  Maintain a healthy weight. The body mass index (BMI) is a screening tool to identify possible weight problems. It provides an estimate of body fat based on height and weight. Your health care provider can find your BMI and can help you achieve or maintain a healthy weight.For adults 20 years and older:  A BMI below 18.5 is considered underweight.  A BMI of 18.5 to 24.9 is normal.  A BMI of 25 to 29.9 is considered overweight.  A  BMI of 30 and above is considered obese.  Maintain normal blood lipids and cholesterol levels by exercising and minimizing your intake of saturated fat. Eat a balanced diet with plenty of fruit and vegetables. Blood tests for lipids and cholesterol should begin at age 45 and be repeated every 5 years. If your lipid or cholesterol levels are high, you are over 50, or you are at high risk for heart disease, you may need your cholesterol levels checked more frequently.Ongoing high lipid and cholesterol levels should be treated with medicines if diet and exercise are not working.  If you smoke, find out from your health care provider how to quit. If you do not use tobacco, do not start.  Lung cancer screening is recommended for adults aged 45-80 years who are at high risk for developing lung cancer because of a history of smoking. A yearly low-dose CT scan of the lungs is recommended for people who have at least a 30-pack-year history of smoking and are a current smoker or have quit within the past 15 years. A pack year of smoking is smoking an average of 1 pack of cigarettes a day for 1 year (for example: 1 pack a day for 30 years or 2 packs a day for 15 years). Yearly screening should continue until the smoker has stopped smoking for at least 15 years. Yearly screening should be stopped for people who develop a health problem that would prevent them from having lung cancer treatment.  If you are pregnant, do not drink alcohol. If you are  breastfeeding, be very cautious about drinking alcohol. If you are not pregnant and choose to drink alcohol, do not have more than 1 drink per day. One drink is considered to be 12 ounces (355 mL) of beer, 5 ounces (148 mL) of wine, or 1.5 ounces (44 mL) of liquor.  Avoid use of street drugs. Do not share needles with anyone. Ask for help if you need support or instructions about stopping the use of drugs.  High blood pressure causes heart disease and increases the risk  of stroke. Your blood pressure should be checked at least every 1 to 2 years. Ongoing high blood pressure should be treated with medicines if weight loss and exercise do not work.  If you are 55-79 years old, ask your health care provider if you should take aspirin to prevent strokes.  Diabetes screening is done by taking a blood sample to check your blood glucose level after you have not eaten for a certain period of time (fasting). If you are not overweight and you do not have risk factors for diabetes, you should be screened once every 3 years starting at age 45. If you are overweight or obese and you are 40-70 years of age, you should be screened for diabetes every year as part of your cardiovascular risk assessment.  Breast cancer screening is essential preventive care for women. You should practice "breast self-awareness." This means understanding the normal appearance and feel of your breasts and may include breast self-examination. Any changes detected, no matter how small, should be reported to a health care provider. Women in their 20s and 30s should have a clinical breast exam (CBE) by a health care provider as part of a regular health exam every 1 to 3 years. After age 40, women should have a CBE every year. Starting at age 40, women should consider having a mammogram (breast X-ray test) every year. Women who have a family history of breast cancer should talk to their health care provider about genetic screening. Women at a high risk of breast cancer should talk to their health care providers about having an MRI and a mammogram every year.  Breast cancer gene (BRCA)-related cancer risk assessment is recommended for women who have family members with BRCA-related cancers. BRCA-related cancers include breast, ovarian, tubal, and peritoneal cancers. Having family members with these cancers may be associated with an increased risk for harmful changes (mutations) in the breast cancer genes BRCA1 and  BRCA2. Results of the assessment will determine the need for genetic counseling and BRCA1 and BRCA2 testing.  Your health care provider may recommend that you be screened regularly for cancer of the pelvic organs (ovaries, uterus, and vagina). This screening involves a pelvic examination, including checking for microscopic changes to the surface of your cervix (Pap test). You may be encouraged to have this screening done every 3 years, beginning at age 21.  For women ages 30-65, health care providers may recommend pelvic exams and Pap testing every 3 years, or they may recommend the Pap and pelvic exam, combined with testing for human papilloma virus (HPV), every 5 years. Some types of HPV increase your risk of cervical cancer. Testing for HPV may also be done on women of any age with unclear Pap test results.  Other health care providers may not recommend any screening for nonpregnant women who are considered low risk for pelvic cancer and who do not have symptoms. Ask your health care provider if a screening pelvic exam is right for   you.  If you have had past treatment for cervical cancer or a condition that could lead to cancer, you need Pap tests and screening for cancer for at least 20 years after your treatment. If Pap tests have been discontinued, your risk factors (such as having a new sexual partner) need to be reassessed to determine if screening should resume. Some women have medical problems that increase the chance of getting cervical cancer. In these cases, your health care provider may recommend more frequent screening and Pap tests.  Colorectal cancer can be detected and often prevented. Most routine colorectal cancer screening begins at the age of 50 years and continues through age 75 years. However, your health care provider may recommend screening at an earlier age if you have risk factors for colon cancer. On a yearly basis, your health care provider may provide home test kits to check  for hidden blood in the stool. Use of a small camera at the end of a tube, to directly examine the colon (sigmoidoscopy or colonoscopy), can detect the earliest forms of colorectal cancer. Talk to your health care provider about this at age 50, when routine screening begins. Direct exam of the colon should be repeated every 5-10 years through age 75 years, unless early forms of precancerous polyps or small growths are found.  People who are at an increased risk for hepatitis B should be screened for this virus. You are considered at high risk for hepatitis B if:  You were born in a country where hepatitis B occurs often. Talk with your health care provider about which countries are considered high risk.  Your parents were born in a high-risk country and you have not received a shot to protect against hepatitis B (hepatitis B vaccine).  You have HIV or AIDS.  You use needles to inject street drugs.  You live with, or have sex with, someone who has hepatitis B.  You get hemodialysis treatment.  You take certain medicines for conditions like cancer, organ transplantation, and autoimmune conditions.  Hepatitis C blood testing is recommended for all people born from 1945 through 1965 and any individual with known risks for hepatitis C.  Practice safe sex. Use condoms and avoid high-risk sexual practices to reduce the spread of sexually transmitted infections (STIs). STIs include gonorrhea, chlamydia, syphilis, trichomonas, herpes, HPV, and human immunodeficiency virus (HIV). Herpes, HIV, and HPV are viral illnesses that have no cure. They can result in disability, cancer, and death.  You should be screened for sexually transmitted illnesses (STIs) including gonorrhea and chlamydia if:  You are sexually active and are younger than 24 years.  You are older than 24 years and your health care provider tells you that you are at risk for this type of infection.  Your sexual activity has changed  since you were last screened and you are at an increased risk for chlamydia or gonorrhea. Ask your health care provider if you are at risk.  If you are at risk of being infected with HIV, it is recommended that you take a prescription medicine daily to prevent HIV infection. This is called preexposure prophylaxis (PrEP). You are considered at risk if:  You are sexually active and do not regularly use condoms or know the HIV status of your partner(s).  You take drugs by injection.  You are sexually active with a partner who has HIV.  Talk with your health care provider about whether you are at high risk of being infected with HIV. If   you choose to begin PrEP, you should first be tested for HIV. You should then be tested every 3 months for as long as you are taking PrEP.  Osteoporosis is a disease in which the bones lose minerals and strength with aging. This can result in serious bone fractures or breaks. The risk of osteoporosis can be identified using a bone density scan. Women ages 67 years and over and women at risk for fractures or osteoporosis should discuss screening with their health care providers. Ask your health care provider whether you should take a calcium supplement or vitamin D to reduce the rate of osteoporosis.  Menopause can be associated with physical symptoms and risks. Hormone replacement therapy is available to decrease symptoms and risks. You should talk to your health care provider about whether hormone replacement therapy is right for you.  Use sunscreen. Apply sunscreen liberally and repeatedly throughout the day. You should seek shade when your shadow is shorter than you. Protect yourself by wearing long sleeves, pants, a wide-brimmed hat, and sunglasses year round, whenever you are outdoors.  Once a month, do a whole body skin exam, using a mirror to look at the skin on your back. Tell your health care provider of new moles, moles that have irregular borders, moles that  are larger than a pencil eraser, or moles that have changed in shape or color.  Stay current with required vaccines (immunizations).  Influenza vaccine. All adults should be immunized every year.  Tetanus, diphtheria, and acellular pertussis (Td, Tdap) vaccine. Pregnant women should receive 1 dose of Tdap vaccine during each pregnancy. The dose should be obtained regardless of the length of time since the last dose. Immunization is preferred during the 27th-36th week of gestation. An adult who has not previously received Tdap or who does not know her vaccine status should receive 1 dose of Tdap. This initial dose should be followed by tetanus and diphtheria toxoids (Td) booster doses every 10 years. Adults with an unknown or incomplete history of completing a 3-dose immunization series with Td-containing vaccines should begin or complete a primary immunization series including a Tdap dose. Adults should receive a Td booster every 10 years.  Varicella vaccine. An adult without evidence of immunity to varicella should receive 2 doses or a second dose if she has previously received 1 dose. Pregnant females who do not have evidence of immunity should receive the first dose after pregnancy. This first dose should be obtained before leaving the health care facility. The second dose should be obtained 4-8 weeks after the first dose.  Human papillomavirus (HPV) vaccine. Females aged 13-26 years who have not received the vaccine previously should obtain the 3-dose series. The vaccine is not recommended for use in pregnant females. However, pregnancy testing is not needed before receiving a dose. If a female is found to be pregnant after receiving a dose, no treatment is needed. In that case, the remaining doses should be delayed until after the pregnancy. Immunization is recommended for any person with an immunocompromised condition through the age of 61 years if she did not get any or all doses earlier. During the  3-dose series, the second dose should be obtained 4-8 weeks after the first dose. The third dose should be obtained 24 weeks after the first dose and 16 weeks after the second dose.  Zoster vaccine. One dose is recommended for adults aged 30 years or older unless certain conditions are present.  Measles, mumps, and rubella (MMR) vaccine. Adults born  before 1957 generally are considered immune to measles and mumps. Adults born in 1957 or later should have 1 or more doses of MMR vaccine unless there is a contraindication to the vaccine or there is laboratory evidence of immunity to each of the three diseases. A routine second dose of MMR vaccine should be obtained at least 28 days after the first dose for students attending postsecondary schools, health care workers, or international travelers. People who received inactivated measles vaccine or an unknown type of measles vaccine during 1963-1967 should receive 2 doses of MMR vaccine. People who received inactivated mumps vaccine or an unknown type of mumps vaccine before 1979 and are at high risk for mumps infection should consider immunization with 2 doses of MMR vaccine. For females of childbearing age, rubella immunity should be determined. If there is no evidence of immunity, females who are not pregnant should be vaccinated. If there is no evidence of immunity, females who are pregnant should delay immunization until after pregnancy. Unvaccinated health care workers born before 1957 who lack laboratory evidence of measles, mumps, or rubella immunity or laboratory confirmation of disease should consider measles and mumps immunization with 2 doses of MMR vaccine or rubella immunization with 1 dose of MMR vaccine.  Pneumococcal 13-valent conjugate (PCV13) vaccine. When indicated, a person who is uncertain of his immunization history and has no record of immunization should receive the PCV13 vaccine. All adults 65 years of age and older should receive this  vaccine. An adult aged 19 years or older who has certain medical conditions and has not been previously immunized should receive 1 dose of PCV13 vaccine. This PCV13 should be followed with a dose of pneumococcal polysaccharide (PPSV23) vaccine. Adults who are at high risk for pneumococcal disease should obtain the PPSV23 vaccine at least 8 weeks after the dose of PCV13 vaccine. Adults older than 57 years of age who have normal immune system function should obtain the PPSV23 vaccine dose at least 1 year after the dose of PCV13 vaccine.  Pneumococcal polysaccharide (PPSV23) vaccine. When PCV13 is also indicated, PCV13 should be obtained first. All adults aged 65 years and older should be immunized. An adult younger than age 65 years who has certain medical conditions should be immunized. Any person who resides in a nursing home or long-term care facility should be immunized. An adult smoker should be immunized. People with an immunocompromised condition and certain other conditions should receive both PCV13 and PPSV23 vaccines. People with human immunodeficiency virus (HIV) infection should be immunized as soon as possible after diagnosis. Immunization during chemotherapy or radiation therapy should be avoided. Routine use of PPSV23 vaccine is not recommended for American Indians, [place] Natives, or people younger than 65 years unless there are medical conditions that require PPSV23 vaccine. When indicated, people who have unknown immunization and have no record of immunization should receive PPSV23 vaccine. One-time revaccination 5 years after the first dose of PPSV23 is recommended for people aged 19-64 years who have chronic kidney failure, nephrotic syndrome, asplenia, or immunocompromised conditions. People who received 1-2 doses of PPSV23 before age 65 years should receive another dose of PPSV23 vaccine at age 65 years or later if at least 5 years have passed since the previous dose. Doses of PPSV23 are not  needed for people immunized with PPSV23 at or after age 65 years.  Meningococcal vaccine. Adults with asplenia or persistent complement component deficiencies should receive 2 doses of quadrivalent meningococcal conjugate (MenACWY-D) vaccine. The doses should be obtained   at least 2 months apart. Microbiologists working with certain meningococcal bacteria, [place] recruits, people at risk during an outbreak, and people who travel to or live in countries with a high rate of meningitis should be immunized. A first-year college student up through age 34 years who is living in a residence hall should receive a dose if she did not receive a dose on or after her 16th birthday. Adults who have certain high-risk conditions should receive one or more doses of vaccine.  Hepatitis A vaccine. Adults who wish to be protected from this disease, have certain high-risk conditions, work with hepatitis A-infected animals, work in hepatitis A research labs, or travel to or work in countries with a high rate of hepatitis A should be immunized. Adults who were previously unvaccinated and who anticipate close contact with an international adoptee during the first 60 days after arrival in the [place] from a country with a high rate of hepatitis A should be immunized.  Hepatitis B vaccine. Adults who wish to be protected from this disease, have certain high-risk conditions, may be exposed to blood or other infectious body fluids, are household contacts or sex partners of hepatitis B positive people, are clients or workers in certain care facilities, or travel to or work in countries with a high rate of hepatitis B should be immunized.  Haemophilus influenzae type b (Hib) vaccine. A previously unvaccinated person with asplenia or sickle cell disease or having a scheduled splenectomy should receive 1 dose of Hib vaccine. Regardless of previous immunization, a recipient of a hematopoietic stem cell transplant should receive a  3-dose series 6-12 months after her successful transplant. Hib vaccine is not recommended for adults with HIV infection. Preventive Services / Frequency Ages 35 to 4 years  Blood pressure check.** / Every 3-5 years.  Lipid and cholesterol check.** / Every 5 years beginning at age 60.  Clinical breast exam.** / Every 3 years for women in their 71s and 10s.  BRCA-related cancer risk assessment.** / For women who have family members with a BRCA-related cancer (breast, ovarian, tubal, or peritoneal cancers).  Pap test.** / Every 2 years from ages 76 through 26. Every 3 years starting at age 61 through age 76 or 93 with a history of 3 consecutive normal Pap tests.  HPV screening.** / Every 3 years from ages 37 through ages 60 to 51 with a history of 3 consecutive normal Pap tests.  Hepatitis C blood test.** / For any individual with known risks for hepatitis C.  Skin self-exam. / Monthly.  Influenza vaccine. / Every year.  Tetanus, diphtheria, and acellular pertussis (Tdap, Td) vaccine.** / Consult your health care provider. Pregnant women should receive 1 dose of Tdap vaccine during each pregnancy. 1 dose of Td every 10 years.  Varicella vaccine.** / Consult your health care provider. Pregnant females who do not have evidence of immunity should receive the first dose after pregnancy.  HPV vaccine. / 3 doses over 6 months, if 93 and younger. The vaccine is not recommended for use in pregnant females. However, pregnancy testing is not needed before receiving a dose.  Measles, mumps, rubella (MMR) vaccine.** / You need at least 1 dose of MMR if you were born in 1957 or later. You may also need a 2nd dose. For females of childbearing age, rubella immunity should be determined. If there is no evidence of immunity, females who are not pregnant should be vaccinated. If there is no evidence of immunity, females who are  pregnant should delay immunization until after pregnancy.  Pneumococcal  13-valent conjugate (PCV13) vaccine.** / Consult your health care provider.  Pneumococcal polysaccharide (PPSV23) vaccine.** / 1 to 2 doses if you smoke cigarettes or if you have certain conditions.  Meningococcal vaccine.** / 1 dose if you are age 68 to 8 years and a Market researcher living in a residence hall, or have one of several medical conditions, you need to get vaccinated against meningococcal disease. You may also need additional booster doses.  Hepatitis A vaccine.** / Consult your health care provider.  Hepatitis B vaccine.** / Consult your health care provider.  Haemophilus influenzae type b (Hib) vaccine.** / Consult your health care provider. Ages 7 to 53 years  Blood pressure check.** / Every year.  Lipid and cholesterol check.** / Every 5 years beginning at age 25 years.  Lung cancer screening. / Every year if you are aged 11-80 years and have a 30-pack-year history of smoking and currently smoke or have quit within the past 15 years. Yearly screening is stopped once you have quit smoking for at least 15 years or develop a health problem that would prevent you from having lung cancer treatment.  Clinical breast exam.** / Every year after age 48 years.  BRCA-related cancer risk assessment.** / For women who have family members with a BRCA-related cancer (breast, ovarian, tubal, or peritoneal cancers).  Mammogram.** / Every year beginning at age 41 years and continuing for as long as you are in good health. Consult with your health care provider.  Pap test.** / Every 3 years starting at age 65 years through age 37 or 70 years with a history of 3 consecutive normal Pap tests.  HPV screening.** / Every 3 years from ages 72 years through ages 60 to 40 years with a history of 3 consecutive normal Pap tests.  Fecal occult blood test (FOBT) of stool. / Every year beginning at age 21 years and continuing until age 5 years. You may not need to do this test if you get  a colonoscopy every 10 years.  Flexible sigmoidoscopy or colonoscopy.** / Every 5 years for a flexible sigmoidoscopy or every 10 years for a colonoscopy beginning at age 35 years and continuing until age 48 years.  Hepatitis C blood test.** / For all people born from [birth year] through 1965 and any individual with known risks for hepatitis C.  Skin self-exam. / Monthly.  Influenza vaccine. / Every year.  Tetanus, diphtheria, and acellular pertussis (Tdap/Td) vaccine.** / Consult your health care provider. Pregnant women should receive 1 dose of Tdap vaccine during each pregnancy. 1 dose of Td every 10 years.  Varicella vaccine.** / Consult your health care provider. Pregnant females who do not have evidence of immunity should receive the first dose after pregnancy.  Zoster vaccine.** / 1 dose for adults aged 30 years or older.  Measles, mumps, rubella (MMR) vaccine.** / You need at least 1 dose of MMR if you were born in 1957 or later. You may also need a second dose. For females of childbearing age, rubella immunity should be determined. If there is no evidence of immunity, females who are not pregnant should be vaccinated. If there is no evidence of immunity, females who are pregnant should delay immunization until after pregnancy.  Pneumococcal 13-valent conjugate (PCV13) vaccine.** / Consult your health care provider.  Pneumococcal polysaccharide (PPSV23) vaccine.** / 1 to 2 doses if you smoke cigarettes or if you have certain conditions.  Meningococcal vaccine.** /  Consult your health care provider.  Hepatitis A vaccine.** / Consult your health care provider.  Hepatitis B vaccine.** / Consult your health care provider.  Haemophilus influenzae type b (Hib) vaccine.** / Consult your health care provider. Ages 64 years and over  Blood pressure check.** / Every year.  Lipid and cholesterol check.** / Every 5 years beginning at age 23 years.  Lung cancer screening. / Every year if you  are aged 16-80 years and have a 30-pack-year history of smoking and currently smoke or have quit within the past 15 years. Yearly screening is stopped once you have quit smoking for at least 15 years or develop a health problem that would prevent you from having lung cancer treatment.  Clinical breast exam.** / Every year after age 74 years.  BRCA-related cancer risk assessment.** / For women who have family members with a BRCA-related cancer (breast, ovarian, tubal, or peritoneal cancers).  Mammogram.** / Every year beginning at age 44 years and continuing for as long as you are in good health. Consult with your health care provider.  Pap test.** / Every 3 years starting at age 58 years through age 22 or 39 years with 3 consecutive normal Pap tests. Testing can be stopped between 65 and 70 years with 3 consecutive normal Pap tests and no abnormal Pap or HPV tests in the past 10 years.  HPV screening.** / Every 3 years from ages 64 years through ages 70 or 61 years with a history of 3 consecutive normal Pap tests. Testing can be stopped between 65 and 70 years with 3 consecutive normal Pap tests and no abnormal Pap or HPV tests in the past 10 years.  Fecal occult blood test (FOBT) of stool. / Every year beginning at age 40 years and continuing until age 27 years. You may not need to do this test if you get a colonoscopy every 10 years.  Flexible sigmoidoscopy or colonoscopy.** / Every 5 years for a flexible sigmoidoscopy or every 10 years for a colonoscopy beginning at age 7 years and continuing until age 32 years.  Hepatitis C blood test.** / For all people born from [birth year] through 1967 and any individual with known risks for hepatitis C.  Osteoporosis screening.** / A one-time screening for women ages 30 years and over and women at risk for fractures or osteoporosis.  Skin self-exam. / Monthly.  Influenza vaccine. / Every year.  Tetanus, diphtheria, and acellular pertussis (Tdap/Td)  vaccine.** / 1 dose of Td every 10 years.  Varicella vaccine.** / Consult your health care provider.  Zoster vaccine.** / 1 dose for adults aged 35 years or older.  Pneumococcal 13-valent conjugate (PCV13) vaccine.** / Consult your health care provider.  Pneumococcal polysaccharide (PPSV23) vaccine.** / 1 dose for all adults aged 46 years and older.  Meningococcal vaccine.** / Consult your health care provider.  Hepatitis A vaccine.** / Consult your health care provider.  Hepatitis B vaccine.** / Consult your health care provider.  Haemophilus influenzae type b (Hib) vaccine.** / Consult your health care provider. ** Family history and personal history of risk and conditions may change your health care provider's recommendations.   This information is not intended to replace advice given to you by your health care provider. Make sure you discuss any questions you have with your health care provider.   Document Released: 07/24/2001 Document Revised: 06/18/2014 Document Reviewed: 10/23/2010 Elsevier Interactive Patient Education [employer].

## 2015-06-27 NOTE — Progress Notes (Signed)
.  Subjective:   Karen Brown is a 55 y.o. female who presents for Medicare Annual (Subsequent) preventive examination.  Review of Systems:   Review of Systems  Constitutional: Negative for activity change, appetite change and fatigue.  HENT: Negative for hearing loss, congestion, tinnitus and ear discharge.   Eyes: Negative for visual disturbance (see optho q1y -- vision corrected to 20/20 with glasses).  Respiratory: Negative for cough, chest tightness and shortness of breath.   Cardiovascular: Negative for chest pain, palpitations and leg swelling.  Gastrointestinal: Negative for abdominal pain, diarrhea, constipation and abdominal distention.  Genitourinary: Negative for urgency, frequency, decreased urine volume and difficulty urinating.  Musculoskeletal: Negative for back pain, arthralgias and gait problem.  Skin: Negative for color change, pallor and rash.  Neurological: Negative for dizziness, light-headedness, numbness and headaches.  Hematological: Negative for adenopathy. Does not bruise/bleed easily.  Psychiatric/Behavioral: Negative for suicidal ideas, confusion, sleep disturbance, self-injury, dysphoric mood, decreased concentration and agitation.  Pt is able to read and write and can do all ADLs No risk for falling No abuse/ violence in home           Objective:     Vitals: BP 128/90 mmHg  Pulse 56  Temp(Src) 98.1 F (36.7 C) (Oral)  Ht '5\' 6"'  (1.676 m)  Wt 208 lb 12.8 oz (94.711 kg)  BMI 33.72 kg/m2  SpO2 96%  BP 128/90 mmHg  Pulse 56  Temp(Src) 98.1 F (36.7 C) (Oral)  Ht '5\' 6"'  (1.676 m)  Wt 208 lb 12.8 oz (94.711 kg)  BMI 33.72 kg/m2  SpO2 96% General appearance: alert, cooperative, appears stated age and no distress Head: Normocephalic, without obvious abnormality, atraumatic Eyes: conjunctivae/corneas clear. PERRL, EOM's intact. Fundi benign. Ears: normal TM's and external ear canals both ears Nose: Nares normal. Septum midline. Mucosa normal.  No drainage or sinus tenderness. Throat: lips, mucosa, and tongue normal; teeth and gums normal Neck: no adenopathy, no carotid bruit, no JVD, supple, symmetrical, trachea midline and thyroid not enlarged, symmetric, no tenderness/mass/nodules Back: symmetric, no curvature. ROM normal. No CVA tenderness. Lungs: clear to auscultation bilaterally Breasts: normal appearance, no masses or tenderness Heart: regular rate and rhythm, S1, S2 normal, no murmur, click, rub or gallop Abdomen: soft, non-tender; bowel sounds normal; no masses,  no organomegaly Pelvic: deferred Extremities: extremities normal, atraumatic, no cyanosis or edema Pulses: 2+ and symmetric Skin: Skin color, texture, turgor normal. No rashes or lesions Lymph nodes: Cervical, supraclavicular, and axillary nodes normal. Neurologic: Alert and oriented X 3, normal strength and tone. Normal symmetric reflexes. Normal coordination and gait Psych- no depression, no anxiety  Tobacco History  Smoking status  . Never Smoker   Smokeless tobacco  . Never Used     Counseling given: Not Answered   Past Medical History  Diagnosis Date  . GERD (gastroesophageal reflux disease)   . Depression   . Hypertension   . Bipolar 1 disorder ([place])   . Fibromyalgia   . IBS (irritable bowel syndrome)   . Hypothyroidism   . PVC (premature ventricular contraction)   . Colitis, ischemic ([place])   . Diverticulosis   . Anxiety   . Hemorrhoids   . Hyperlipemia   . Chronic migraine   . Chronically dry eyes   . Dry mouth    Past Surgical History  Procedure Laterality Date  . Cholecystectomy    . Abdominal hysterectomy      Partial   . Tonsillectomy    . Back surgery  Micro diskectomy  . Back surgery      L facet rhzotomy  . Appendectomy     Family History  Problem Relation Age of Onset  . Kidney disease Father   . Coronary artery disease Father   . COPD Father 79    copd  . Heart disease Father   . Colon polyps Mother   .  Osteoporosis Mother   . Colon cancer Neg Hx    History  Sexual Activity  . Sexual Activity: Yes    Outpatient Encounter Prescriptions as of 06/27/2015  Medication Sig  . Cholecalciferol (VITAMIN D-3) 5000 UNITS TABS Take 2 capsules by mouth daily.   . clidinium-chlordiazePOXIDE (LIBRAX) 5-2.5 MG per capsule TAKE ONE CAPSULE BY MOUTH 3 TIMES A DAY BEFORE MEALS AS NEEDED  . clonazePAM (KLONOPIN) 1 MG tablet Take 1 mg by mouth 3 (three) times daily as needed.    Marland Kitchen estradiol (VIVELLE-DOT) 0.075 MG/24HR Place 1 patch onto the skin once a week.   . fentaNYL (DURAGESIC - DOSED MCG/HR) 75 MCG/HR Place 1 patch onto the skin every other day.    . flurbiprofen (ANSAID) 100 MG tablet Take 100 mg by mouth 2 (two) times daily as needed.  Marland Kitchen HYDROcodone-acetaminophen (NORCO) 10-325 MG per tablet Take by mouth daily. Will take up five a day  . isometheptene-acetaminophen-dichloralphenazone (MIDRIN) 65-100-325 MG capsule Take 2 capsules by mouth 4 (four) times daily as needed for migraine. Maximum 5 capsules in 12 hours for migraine headaches, 8 capsules in 24 hours for tension headaches.  . lamoTRIgine (LAMICTAL) 25 MG tablet Take 25 mg by mouth 2 (two) times daily. 1 in the morning and 1 in the evening  . lisinopril (PRINIVIL,ZESTRIL) 10 MG tablet Take 10 mg by mouth daily.  . Loperamide-Simethicone (IMODIUM ADVANCED PO) Take by mouth as needed.    Marland Kitchen PRISTIQ 100 MG 24 hr tablet Take 1 tablet by mouth daily.   . promethazine (PHENERGAN) 25 MG tablet TAKE ONE TABLET BY MOUTH AS NEEDED  . SYNTHROID 137 MCG tablet Take 137 mcg by mouth daily before breakfast.  . tiZANidine (ZANAFLEX) 4 MG capsule Take 4 mg by mouth 3 (three) times daily as needed for muscle spasms.  . [DISCONTINUED] levothyroxine (SYNTHROID, LEVOTHROID) 137 MCG tablet Take 137 mcg by mouth daily before breakfast.  . [DISCONTINUED] metroNIDAZOLE (FLAGYL) 250 MG tablet Take 1 tablet (250 mg total) by mouth 2 (two) times daily.  . [DISCONTINUED]  lisinopril-hydrochlorothiazide (PRINZIDE,ZESTORETIC) 20-12.5 MG per tablet Take 1 tablet by mouth daily.  . [DISCONTINUED] SYNTHROID 125 MCG tablet Take 1 tablet by mouth daily.   No facility-administered encounter medications on file as of 06/27/2015.    Activities of Daily Living In your present state of health, do you have any difficulty performing the following activities: 06/28/2015 01/07/2015  Hearing? N N  Vision? N N  Difficulty concentrating or making decisions? N N  Walking or climbing stairs? N N  Dressing or bathing? N N  Doing errands, shopping? N N    Patient Care Team: Rosalita Chessman, DO as PCP - General    Assessment:    CPE Exercise Activities and Dietary recommendations---  Exercise 3days a week for at least 30 min    Goals    None     Fall Risk Fall Risk  06/28/2015  Falls in the past year? No   Depression Screen PHQ 2/9 Scores 06/28/2015  PHQ - 2 Score 0     Cognitive Testing No flowsheet data  found.  Immunization History  Administered Date(s) Administered  . Influenza Split 04/09/2011, 04/03/2012  . Influenza Whole 03/15/2010  . Influenza,inj,Quad PF,36+ Mos 04/07/2013, 03/26/2014, 03/22/2015  . Td 01/23/2007   Screening Tests Health Maintenance  Topic Date Due  . Hepatitis C Screening  1960-06-21  . HIV Screening  01/07/2016 (Originally 03/11/1974)  . INFLUENZA VACCINE  01/10/2016  . MAMMOGRAM  10/06/2016  . TETANUS/TDAP  01/22/2017  . COLONOSCOPY  12/10/2019      Plan:    During the course of the visit the patient was educated and counseled about the following appropriate screening and preventive services:   Vaccines to include Pneumoccal, Influenza, Hepatitis B, Td, Zostavax, HCV  Electrocardiogram  Cardiovascular Disease  Colorectal cancer screening  Bone density screening  Diabetes screening  Glaucoma screening  Mammography/PAP  Nutrition counseling   Patient Instructions (the written plan) was given to the patient.    1. Medicare annual wellness visit, subsequent    2. Hyperlipidemia Check labs - POCT urinalysis dipstick - Lipid panel - Comp Met (CMET)  3. Routine history and physical examination of adult     Garnet Koyanagi, DO  06/28/2015

## 2015-06-27 NOTE — Progress Notes (Signed)
Pre visit review using our clinic review tool, if applicable. No additional management support is needed unless otherwise documented below in the visit note. 

## 2015-06-28 LAB — COMPREHENSIVE METABOLIC PANEL
ALT: 22 U/L (ref 0–35)
AST: 24 U/L (ref 0–37)
Albumin: 4.7 g/dL (ref 3.5–5.2)
Alkaline Phosphatase: 76 U/L (ref 39–117)
BILIRUBIN TOTAL: 0.5 mg/dL (ref 0.2–1.2)
BUN: 12 mg/dL (ref 6–23)
CALCIUM: 9.9 mg/dL (ref 8.4–10.5)
CHLORIDE: 101 meq/L (ref 96–112)
CO2: 33 mEq/L — ABNORMAL HIGH (ref 19–32)
Creatinine, Ser: 0.79 mg/dL (ref 0.40–1.20)
GFR: 79.93 mL/min (ref >60.00)
Glucose, Bld: 93 mg/dL (ref 70–99)
Potassium: 4.6 mEq/L (ref 3.5–5.1)
Sodium: 140 mEq/L (ref 135–145)
Total Protein: 7.9 g/dL (ref 6.0–8.3)

## 2015-06-28 LAB — LIPID PANEL
Cholesterol: 279 mg/dL — ABNORMAL HIGH (ref 0–200)
HDL: 50.1 mg/dL (ref >39.00)
LDL CALC: 190 mg/dL — AB (ref 0–99)
NonHDL: 228.4
TRIGLYCERIDES: 194 mg/dL — AB (ref 0.0–149.0)
Total CHOL/HDL Ratio: 6
VLDL: 38.8 mg/dL (ref 0.0–40.0)

## 2015-07-05 DIAGNOSIS — M4726 Other spondylosis with radiculopathy, lumbar region: Secondary | ICD-10-CM | POA: Diagnosis not present

## 2015-07-05 DIAGNOSIS — G894 Chronic pain syndrome: Secondary | ICD-10-CM | POA: Diagnosis not present

## 2015-07-05 DIAGNOSIS — Z79891 Long term (current) use of opiate analgesic: Secondary | ICD-10-CM | POA: Diagnosis not present

## 2015-07-05 DIAGNOSIS — M179 Osteoarthritis of knee, unspecified: Secondary | ICD-10-CM | POA: Diagnosis not present

## 2015-07-20 DIAGNOSIS — E559 Vitamin D deficiency, unspecified: Secondary | ICD-10-CM | POA: Diagnosis not present

## 2015-07-20 DIAGNOSIS — E039 Hypothyroidism, unspecified: Secondary | ICD-10-CM | POA: Diagnosis not present

## 2015-08-02 DIAGNOSIS — G894 Chronic pain syndrome: Secondary | ICD-10-CM | POA: Diagnosis not present

## 2015-08-02 DIAGNOSIS — M179 Osteoarthritis of knee, unspecified: Secondary | ICD-10-CM | POA: Diagnosis not present

## 2015-08-02 DIAGNOSIS — M4726 Other spondylosis with radiculopathy, lumbar region: Secondary | ICD-10-CM | POA: Diagnosis not present

## 2015-08-02 DIAGNOSIS — Z79891 Long term (current) use of opiate analgesic: Secondary | ICD-10-CM | POA: Diagnosis not present

## 2015-08-11 ENCOUNTER — Telehealth: Payer: Self-pay | Admitting: Family Medicine

## 2015-08-11 DIAGNOSIS — R030 Elevated blood-pressure reading, without diagnosis of hypertension: Secondary | ICD-10-CM | POA: Diagnosis not present

## 2015-08-11 DIAGNOSIS — G43009 Migraine without aura, not intractable, without status migrainosus: Secondary | ICD-10-CM | POA: Diagnosis not present

## 2015-08-11 DIAGNOSIS — G44219 Episodic tension-type headache, not intractable: Secondary | ICD-10-CM | POA: Diagnosis not present

## 2015-08-11 NOTE — Telephone Encounter (Signed)
Caller name: Self  Can be reached: [phone]   Reason for call: Request referral to [facility], [place], [place], [place]. Fax Num [phone]

## 2015-08-11 NOTE — Telephone Encounter (Signed)
Pt called back in to update. She says that she no longer need the referral.

## 2015-08-30 DIAGNOSIS — Z79891 Long term (current) use of opiate analgesic: Secondary | ICD-10-CM | POA: Diagnosis not present

## 2015-08-30 DIAGNOSIS — G894 Chronic pain syndrome: Secondary | ICD-10-CM | POA: Diagnosis not present

## 2015-08-30 DIAGNOSIS — M4726 Other spondylosis with radiculopathy, lumbar region: Secondary | ICD-10-CM | POA: Diagnosis not present

## 2015-08-30 DIAGNOSIS — M179 Osteoarthritis of knee, unspecified: Secondary | ICD-10-CM | POA: Diagnosis not present

## 2015-09-27 DIAGNOSIS — M179 Osteoarthritis of knee, unspecified: Secondary | ICD-10-CM | POA: Diagnosis not present

## 2015-09-27 DIAGNOSIS — M4726 Other spondylosis with radiculopathy, lumbar region: Secondary | ICD-10-CM | POA: Diagnosis not present

## 2015-09-27 DIAGNOSIS — G894 Chronic pain syndrome: Secondary | ICD-10-CM | POA: Diagnosis not present

## 2015-09-27 DIAGNOSIS — Z79891 Long term (current) use of opiate analgesic: Secondary | ICD-10-CM | POA: Diagnosis not present

## 2015-10-05 ENCOUNTER — Other Ambulatory Visit: Payer: Self-pay

## 2015-10-05 DIAGNOSIS — Z1231 Encounter for screening mammogram for malignant neoplasm of breast: Secondary | ICD-10-CM

## 2015-10-19 ENCOUNTER — Ambulatory Visit: Payer: Medicare Other

## 2015-10-24 DIAGNOSIS — M5136 Other intervertebral disc degeneration, lumbar region: Secondary | ICD-10-CM | POA: Diagnosis not present

## 2015-10-24 DIAGNOSIS — M546 Pain in thoracic spine: Secondary | ICD-10-CM | POA: Diagnosis not present

## 2015-10-25 DIAGNOSIS — M4726 Other spondylosis with radiculopathy, lumbar region: Secondary | ICD-10-CM | POA: Diagnosis not present

## 2015-10-25 DIAGNOSIS — Z79891 Long term (current) use of opiate analgesic: Secondary | ICD-10-CM | POA: Diagnosis not present

## 2015-10-25 DIAGNOSIS — G894 Chronic pain syndrome: Secondary | ICD-10-CM | POA: Diagnosis not present

## 2015-10-25 DIAGNOSIS — M179 Osteoarthritis of knee, unspecified: Secondary | ICD-10-CM | POA: Diagnosis not present

## 2015-10-31 DIAGNOSIS — M546 Pain in thoracic spine: Secondary | ICD-10-CM | POA: Diagnosis not present

## 2015-10-31 DIAGNOSIS — M545 Low back pain: Secondary | ICD-10-CM | POA: Diagnosis not present

## 2015-11-04 DIAGNOSIS — M5416 Radiculopathy, lumbar region: Secondary | ICD-10-CM | POA: Diagnosis not present

## 2015-11-10 DIAGNOSIS — I10 Essential (primary) hypertension: Secondary | ICD-10-CM | POA: Diagnosis not present

## 2015-11-10 DIAGNOSIS — R7301 Impaired fasting glucose: Secondary | ICD-10-CM | POA: Diagnosis not present

## 2015-11-10 DIAGNOSIS — E039 Hypothyroidism, unspecified: Secondary | ICD-10-CM | POA: Diagnosis not present

## 2015-11-10 DIAGNOSIS — E559 Vitamin D deficiency, unspecified: Secondary | ICD-10-CM | POA: Diagnosis not present

## 2015-11-24 DIAGNOSIS — G894 Chronic pain syndrome: Secondary | ICD-10-CM | POA: Diagnosis not present

## 2015-11-24 DIAGNOSIS — Z79891 Long term (current) use of opiate analgesic: Secondary | ICD-10-CM | POA: Diagnosis not present

## 2015-11-24 DIAGNOSIS — M179 Osteoarthritis of knee, unspecified: Secondary | ICD-10-CM | POA: Diagnosis not present

## 2015-11-24 DIAGNOSIS — M4726 Other spondylosis with radiculopathy, lumbar region: Secondary | ICD-10-CM | POA: Diagnosis not present

## 2015-12-19 DIAGNOSIS — G43009 Migraine without aura, not intractable, without status migrainosus: Secondary | ICD-10-CM | POA: Diagnosis not present

## 2015-12-19 DIAGNOSIS — G44219 Episodic tension-type headache, not intractable: Secondary | ICD-10-CM | POA: Diagnosis not present

## 2015-12-22 DIAGNOSIS — M179 Osteoarthritis of knee, unspecified: Secondary | ICD-10-CM | POA: Diagnosis not present

## 2015-12-22 DIAGNOSIS — G894 Chronic pain syndrome: Secondary | ICD-10-CM | POA: Diagnosis not present

## 2015-12-22 DIAGNOSIS — Z79891 Long term (current) use of opiate analgesic: Secondary | ICD-10-CM | POA: Diagnosis not present

## 2015-12-22 DIAGNOSIS — M4726 Other spondylosis with radiculopathy, lumbar region: Secondary | ICD-10-CM | POA: Diagnosis not present

## 2015-12-23 ENCOUNTER — Ambulatory Visit
Admission: RE | Admit: 2015-12-23 | Discharge: 2015-12-23 | Disposition: A | Discharge status: Home / Self Care | Payer: Medicare Other | Source: Ambulatory Visit

## 2015-12-23 DIAGNOSIS — Z1231 Encounter for screening mammogram for malignant neoplasm of breast: Secondary | ICD-10-CM | POA: Diagnosis not present

## 2015-12-29 DIAGNOSIS — Z6833 Body mass index (BMI) 33.0-33.9, adult: Secondary | ICD-10-CM | POA: Diagnosis not present

## 2015-12-29 DIAGNOSIS — Z01419 Encounter for gynecological examination (general) (routine) without abnormal findings: Secondary | ICD-10-CM | POA: Diagnosis not present

## 2016-01-19 DIAGNOSIS — M4726 Other spondylosis with radiculopathy, lumbar region: Secondary | ICD-10-CM | POA: Diagnosis not present

## 2016-01-19 DIAGNOSIS — M179 Osteoarthritis of knee, unspecified: Secondary | ICD-10-CM | POA: Diagnosis not present

## 2016-01-19 DIAGNOSIS — Z79891 Long term (current) use of opiate analgesic: Secondary | ICD-10-CM | POA: Diagnosis not present

## 2016-01-19 DIAGNOSIS — G894 Chronic pain syndrome: Secondary | ICD-10-CM | POA: Diagnosis not present

## 2016-02-16 DIAGNOSIS — M4726 Other spondylosis with radiculopathy, lumbar region: Secondary | ICD-10-CM | POA: Diagnosis not present

## 2016-02-16 DIAGNOSIS — G894 Chronic pain syndrome: Secondary | ICD-10-CM | POA: Diagnosis not present

## 2016-02-16 DIAGNOSIS — M179 Osteoarthritis of knee, unspecified: Secondary | ICD-10-CM | POA: Diagnosis not present

## 2016-02-16 DIAGNOSIS — Z79891 Long term (current) use of opiate analgesic: Secondary | ICD-10-CM | POA: Diagnosis not present

## 2016-03-14 DIAGNOSIS — Z79891 Long term (current) use of opiate analgesic: Secondary | ICD-10-CM | POA: Diagnosis not present

## 2016-03-14 DIAGNOSIS — M4726 Other spondylosis with radiculopathy, lumbar region: Secondary | ICD-10-CM | POA: Diagnosis not present

## 2016-03-14 DIAGNOSIS — G894 Chronic pain syndrome: Secondary | ICD-10-CM | POA: Diagnosis not present

## 2016-03-14 DIAGNOSIS — M179 Osteoarthritis of knee, unspecified: Secondary | ICD-10-CM | POA: Diagnosis not present

## 2016-04-12 DIAGNOSIS — M4726 Other spondylosis with radiculopathy, lumbar region: Secondary | ICD-10-CM | POA: Diagnosis not present

## 2016-04-12 DIAGNOSIS — Z79891 Long term (current) use of opiate analgesic: Secondary | ICD-10-CM | POA: Diagnosis not present

## 2016-04-12 DIAGNOSIS — M179 Osteoarthritis of knee, unspecified: Secondary | ICD-10-CM | POA: Diagnosis not present

## 2016-04-12 DIAGNOSIS — G894 Chronic pain syndrome: Secondary | ICD-10-CM | POA: Diagnosis not present

## 2016-04-13 ENCOUNTER — Ambulatory Visit (INDEPENDENT_AMBULATORY_CARE_PROVIDER_SITE_OTHER): Payer: Medicare Other | Admitting: Family Medicine

## 2016-04-13 ENCOUNTER — Encounter: Payer: Self-pay | Admitting: Family Medicine

## 2016-04-13 VITALS — BP 112/78 | HR 86 | Temp 99.2°F | Resp 16 | Ht 66.0 in | Wt 205.5 lb

## 2016-04-13 DIAGNOSIS — R110 Nausea: Secondary | ICD-10-CM | POA: Diagnosis not present

## 2016-04-13 DIAGNOSIS — Z23 Encounter for immunization: Secondary | ICD-10-CM | POA: Diagnosis not present

## 2016-04-13 DIAGNOSIS — G43009 Migraine without aura, not intractable, without status migrainosus: Secondary | ICD-10-CM

## 2016-04-13 MED ORDER — PROMETHAZINE HCL 25 MG PO TABS: 25.0000 mg | ORAL_TABLET | ORAL | 0 refills | Status: DC | PRN

## 2016-04-13 NOTE — Assessment & Plan Note (Signed)
con't phenergan and midrin rto 3 months

## 2016-04-13 NOTE — Progress Notes (Signed)
Pre visit review using our clinic review tool, if applicable. No additional management support is needed unless otherwise documented below in the visit note. 

## 2016-04-13 NOTE — Patient Instructions (Signed)

## 2016-04-13 NOTE — Progress Notes (Signed)
Patient ID: Karen Brown, female    DOB: [birth year]  Age: 55 y.o. MRN: [id number]    Subjective:  Subjective  HPI [name] presents for f/u migraine medication.  She would like for Karen Brown to take over the meds.    Review of Systems  Constitutional: Negative for appetite change, diaphoresis, fatigue and unexpected weight change.  Eyes: Negative for pain, redness and visual disturbance.  Respiratory: Negative for cough, chest tightness, shortness of breath and wheezing.   Cardiovascular: Negative for chest pain, palpitations and leg swelling.  Endocrine: Negative for cold intolerance, heat intolerance, polydipsia, polyphagia and polyuria.  Genitourinary: Negative for difficulty urinating, dysuria and frequency.  Neurological: Negative for dizziness, light-headedness, numbness and headaches.    History Past Medical History:  Diagnosis Date  . Anxiety   . Bipolar 1 disorder (Karen Brown)   . Chronic migraine   . Chronically dry eyes   . Colitis, ischemic ([place])   . Depression   . Diverticulosis   . Dry mouth   . Fibromyalgia   . GERD (gastroesophageal reflux disease)   . Hemorrhoids   . Hyperlipemia   . Hypertension   . Hypothyroidism   . IBS (irritable bowel syndrome)   . PVC (premature ventricular contraction)     She has a past surgical history that includes Cholecystectomy; Abdominal hysterectomy; Tonsillectomy; Back surgery; Back surgery; and Appendectomy.   Her family history includes COPD (age of onset: 59) in her father; Colon polyps in her mother; Coronary artery disease (age of onset: 102) in her father; Heart disease in her father; Kidney disease in her father; Osteoporosis in her mother.She reports that she has never smoked. She has never used smokeless tobacco. She reports that she does not drink alcohol or use drugs.  Current Outpatient Prescriptions on File Prior to Visit  Medication Sig Dispense Refill  . Cholecalciferol (VITAMIN D-3) 5000 UNITS TABS Take 2 capsules  by mouth daily.     . clidinium-chlordiazePOXIDE (LIBRAX) 5-2.5 MG per capsule TAKE ONE CAPSULE BY MOUTH 3 TIMES A DAY BEFORE MEALS AS NEEDED 90 capsule 2  . clonazePAM (KLONOPIN) 1 MG tablet Take 1 mg by mouth 3 (three) times daily as needed.      Marland Kitchen estradiol (VIVELLE-DOT) 0.075 MG/24HR Place 1 patch onto the skin once a week.     . fentaNYL (DURAGESIC - DOSED MCG/HR) 75 MCG/HR Place 1 patch onto the skin every other day.      Marland Kitchen HYDROcodone-acetaminophen (NORCO) 10-325 MG per tablet Take by mouth daily. Will take up five a day    . isometheptene-acetaminophen-dichloralphenazone (MIDRIN) 65-100-325 MG capsule Take 2 capsules by mouth 4 (four) times daily as needed for migraine. Maximum 5 capsules in 12 hours for migraine headaches, 8 capsules in 24 hours for tension headaches.    . lamoTRIgine (LAMICTAL) 25 MG tablet Take 25 mg by mouth 2 (two) times daily. 1 in the morning and 1 in the evening    . lisinopril (PRINIVIL,ZESTRIL) 10 MG tablet Take 10 mg by mouth daily.    . Loperamide-Simethicone (IMODIUM ADVANCED PO) Take by mouth as needed.      Marland Kitchen PRISTIQ 100 MG 24 hr tablet Take 1 tablet by mouth daily.     Marland Kitchen tiZANidine (ZANAFLEX) 4 MG capsule Take 4 mg by mouth 3 (three) times daily as needed for muscle spasms.    Marland Kitchen SYNTHROID 137 MCG tablet Take 137 mcg by mouth daily before breakfast.     No current facility-administered medications on  file prior to visit.      Objective:  Objective  Physical Exam  Constitutional: She is oriented to person, place, and time. She appears well-developed and well-nourished.  HENT:  Head: Normocephalic and atraumatic.  Eyes: Conjunctivae and EOM are normal.  Neck: Normal range of motion. Neck supple. No JVD present. Carotid bruit is not present. No thyromegaly present.  Cardiovascular: Normal rate, regular rhythm and normal heart sounds.   No murmur heard. Pulmonary/Chest: Effort normal and breath sounds normal. No respiratory distress. She has no wheezes.  She has no rales. She exhibits no tenderness.  Musculoskeletal: She exhibits no edema.  Neurological: She is alert and oriented to person, place, and time.  Psychiatric: She has a normal mood and affect. Her behavior is normal. Judgment and thought content normal.  Nursing note and vitals reviewed.  BP 112/78 (BP Location: Right Arm, Patient Position: Sitting, Cuff Size: Large)   Pulse 86   Temp 99.2 F (37.3 C) (Oral)   Resp 16   Ht 5\' 6"  (1.676 m)   Wt 205 lb 8 oz (93.2 kg)   SpO2 95%   BMI 33.17 kg/m  Wt Readings from Last 3 Encounters:  04/13/16 205 lb 8 oz (93.2 kg)  06/27/15 208 lb 12.8 oz (94.7 kg)  01/07/15 207 lb 12.8 oz (94.3 kg)     Lab Results  Component Value Date   WBC 8.2 02/25/2013   HGB 14.5 02/25/2013   HCT 42.7 02/25/2013   PLT 314.0 02/25/2013   GLUCOSE 93 06/27/2015   CHOL 279 (H) 06/27/2015   TRIG 194.0 (H) 06/27/2015   HDL 50.10 06/27/2015   LDLDIRECT 236.2 02/25/2013   LDLCALC 190 (H) 06/27/2015   ALT 22 06/27/2015   AST 24 06/27/2015   NA 140 06/27/2015   K 4.6 06/27/2015   CL 101 06/27/2015   CREATININE 0.79 06/27/2015   BUN 12 06/27/2015   CO2 33 (H) 06/27/2015   TSH 0.82 02/10/2010   HGBA1C 5.4 08/15/2007    Mm Screening Breast Tomo Bilateral  Result Date: 12/23/2015 CLINICAL DATA:  Screening. EXAM: 2D DIGITAL SCREENING BILATERAL MAMMOGRAM WITH CAD AND ADJUNCT TOMO COMPARISON:  Previous exam(s). ACR Breast Density Category b: There are scattered areas of fibroglandular density. FINDINGS: There are no findings suspicious for malignancy. Images were processed with CAD. IMPRESSION: No mammographic evidence of malignancy. A result letter of this screening mammogram will be mailed directly to the patient. RECOMMENDATION: Screening mammogram in one year. (Code:SM-B-01Y) BI-RADS CATEGORY  1: Negative. Electronically Signed   By: Everlean Alstrom M.D.   On: 12/23/2015 17:04     Assessment & Plan:  Plan  I have discontinued Ms. Kari's  flurbiprofen. I have also changed her promethazine. Additionally, I am having her maintain her lamoTRIgine, HYDROcodone-acetaminophen, PRISTIQ, clonazePAM, Vitamin D-3, fentaNYL, Loperamide-Simethicone (IMODIUM ADVANCED PO), estradiol, tiZANidine, lisinopril, isometheptene-acetaminophen-dichloralphenazone, clidinium-chlordiazePOXIDE, SYNTHROID, and SYNTHROID.  Meds ordered this encounter  Medications  . SYNTHROID 125 MCG tablet    Sig: Take 125 mg by mouth daily.  . promethazine (PHENERGAN) 25 MG tablet    Sig: Take 1 tablet (25 mg total) by mouth as needed.    Dispense:  90 tablet    Refill:  0    Problem List Items Addressed This Visit      Unprioritized   Migraine without aura    con't phenergan and midrin rto 3 months       Other Visit Diagnoses    Nausea without vomiting    -  Primary   Relevant Medications   promethazine (PHENERGAN) 25 MG tablet   Encounter for immunization       Relevant Orders   Flu Vaccine QUAD 36+ mos IM (Completed)      Follow-up: Return in about 3 months (around 07/14/2016).  Ann Held, DO

## 2016-05-08 DIAGNOSIS — Z79891 Long term (current) use of opiate analgesic: Secondary | ICD-10-CM | POA: Diagnosis not present

## 2016-05-08 DIAGNOSIS — M179 Osteoarthritis of knee, unspecified: Secondary | ICD-10-CM | POA: Diagnosis not present

## 2016-05-08 DIAGNOSIS — M4726 Other spondylosis with radiculopathy, lumbar region: Secondary | ICD-10-CM | POA: Diagnosis not present

## 2016-05-08 DIAGNOSIS — M62838 Other muscle spasm: Secondary | ICD-10-CM | POA: Diagnosis not present

## 2016-05-08 DIAGNOSIS — G894 Chronic pain syndrome: Secondary | ICD-10-CM | POA: Diagnosis not present

## 2016-05-09 DIAGNOSIS — M179 Osteoarthritis of knee, unspecified: Secondary | ICD-10-CM | POA: Diagnosis not present

## 2016-05-09 DIAGNOSIS — G894 Chronic pain syndrome: Secondary | ICD-10-CM | POA: Diagnosis not present

## 2016-05-09 DIAGNOSIS — Z79891 Long term (current) use of opiate analgesic: Secondary | ICD-10-CM | POA: Diagnosis not present

## 2016-05-09 DIAGNOSIS — M4726 Other spondylosis with radiculopathy, lumbar region: Secondary | ICD-10-CM | POA: Diagnosis not present

## 2016-05-15 DIAGNOSIS — E039 Hypothyroidism, unspecified: Secondary | ICD-10-CM | POA: Diagnosis not present

## 2016-05-15 DIAGNOSIS — E559 Vitamin D deficiency, unspecified: Secondary | ICD-10-CM | POA: Diagnosis not present

## 2016-05-15 DIAGNOSIS — I10 Essential (primary) hypertension: Secondary | ICD-10-CM | POA: Diagnosis not present

## 2016-05-15 DIAGNOSIS — R7301 Impaired fasting glucose: Secondary | ICD-10-CM | POA: Diagnosis not present

## 2016-05-28 ENCOUNTER — Encounter: Payer: Self-pay | Admitting: Family Medicine

## 2016-05-28 ENCOUNTER — Ambulatory Visit (INDEPENDENT_AMBULATORY_CARE_PROVIDER_SITE_OTHER): Payer: Medicare Other | Admitting: Family Medicine

## 2016-05-28 VITALS — BP 127/69 | HR 76 | Temp 99.0°F | Ht 66.0 in | Wt 210.4 lb

## 2016-05-28 DIAGNOSIS — J0110 Acute frontal sinusitis, unspecified: Secondary | ICD-10-CM

## 2016-05-28 DIAGNOSIS — J029 Acute pharyngitis, unspecified: Secondary | ICD-10-CM | POA: Diagnosis not present

## 2016-05-28 LAB — POCT RAPID STREP A (OFFICE): Rapid Strep A Screen: NEGATIVE

## 2016-05-28 MED ORDER — DOXYCYCLINE HYCLATE 100 MG PO CAPS: 100.0000 mg | ORAL_CAPSULE | Freq: Two times a day (BID) | ORAL | 0 refills | Status: DC

## 2016-05-28 NOTE — Patient Instructions (Signed)
We are going to treat you for a sinus infection with doxycycline- take it twice a day for 10 days Let me know if you are not feeling better in the next few days- Sooner if worse.

## 2016-05-28 NOTE — Progress Notes (Signed)
[facility] [address], [place], [place] [address] [phone]  Date:  05/28/2016   Name:  Karen Brown   DOB:  March 17, 1961   MRN:  [id number]  PCP:  Ann Held, DO    Chief Complaint: Sinus Problem (c/o cold sx's x 5 weeks. c/o sore throat, stuffy head, occ cough, sinus pressure and headache. )   History of Present Illness:  Karen Brown is a 55 y.o. very pleasant female patient who presents with the following:  Here today for a sick visit.   She has noted sx for a little over a month. She has noted a ST that comes and goes.  She started to feel better last week, but then her sx returned and she now thinks that she has a sinus infection. She notes a headache, pressure in her sinuses. She notes that her normal temp is about 97 degrees.  She considers 99 to be a mild fever Occasional cough only- can be productive No earache, no sneezing No GI symptoms Her husband has had similar sx  She has tried mucinex, sudafed, that is about it  She is allergic to penicillin and biaxin Her other chronic health conditions are stable, medications are unchanged She is disabled due to back problems    Patient Active Problem List   Diagnosis Date Noted  . UTI symptoms 12/01/2014  . Obesity (BMI 30-39.9) 02/25/2013  . Migraine without aura 07/07/2009  . UTI 04/23/2009  . NEVI, MULTIPLE 02/07/2009  . ACUTE PHARYNGITIS 12/27/2008  . PREMATURE VENTRICULAR CONTRACTIONS 02/10/2008  . GERD 02/10/2008  . DIVERTICULOSIS OF COLON 02/10/2008  . ENDOMETRIOSIS, SITE UNSPECIFIED 02/10/2008  . BACK PAIN, LUMBAR 02/06/2008  . MURMUR 07/08/2007  . HYPERTENSION, ESSENTIAL NOS 05/30/2007  . HYPOTHYROIDISM 07/29/2006  . BIPOLAR I, MIXED, MOST RECENT EPSD NOS 07/29/2006  . DEPRESSION 07/29/2006  . ROSACEA 07/29/2006  . FIBROMYALGIA 07/29/2006  . HEADACHE 07/29/2006  . Personal history of other diseases of digestive system  07/29/2006  . HX, PERSONAL, URINARY CALCULI 07/29/2006    Past Medical History:  Diagnosis Date  . Anxiety   . Bipolar 1 disorder ([place])   . Chronic migraine   . Chronically dry eyes   . Colitis, ischemic ([place])   . Depression   . Diverticulosis   . Dry mouth   . Fibromyalgia   . GERD (gastroesophageal reflux disease)   . Hemorrhoids   . Hyperlipemia   . Hypertension   . Hypothyroidism   . IBS (irritable bowel syndrome)   . PVC (premature ventricular contraction)     Past Surgical History:  Procedure Laterality Date  . ABDOMINAL HYSTERECTOMY     Partial   . APPENDECTOMY    . BACK SURGERY     Micro diskectomy  . BACK SURGERY     L facet rhzotomy  . CHOLECYSTECTOMY    . TONSILLECTOMY      Social History  Substance Use Topics  . Smoking status: Never Smoker  . Smokeless tobacco: Never Used  . Alcohol use No    Family History  Problem Relation Age of Onset  . Kidney disease Father   . Coronary artery disease Father 40  . COPD Father 12    copd  . Heart disease Father   . Colon polyps Mother   . Osteoporosis Mother   . Colon cancer Neg Hx     Allergies  Allergen Reactions  .  Biaxin [Clarithromycin] Other (See Comments)    Bad taste and ringing in ear with dec hearing  . Ketorolac Tromethamine   . Pb-Hyoscy-Atropine-Scopolamine   . Penicillins     REACTION: HIVES  . Phenobarbital   . Sulfonamide Derivatives   . Topiramate   . Zaleplon     Medication list has been reviewed and updated.  Current Outpatient Prescriptions on File Prior to Visit  Medication Sig Dispense Refill  . Cholecalciferol (VITAMIN D-3) 5000 UNITS TABS Take 2 capsules by mouth daily.     . clidinium-chlordiazePOXIDE (LIBRAX) 5-2.5 MG per capsule TAKE ONE CAPSULE BY MOUTH 3 TIMES A DAY BEFORE MEALS AS NEEDED 90 capsule 2  . clonazePAM (KLONOPIN) 1 MG tablet Take 1 mg by mouth 3 (three) times daily as needed.      Marland Kitchen estradiol (VIVELLE-DOT) 0.075 MG/24HR Place 1 patch onto the skin  once a week.     . fentaNYL (DURAGESIC - DOSED MCG/HR) 75 MCG/HR Place 1 patch onto the skin every other day.      Marland Kitchen HYDROcodone-acetaminophen (NORCO) 10-325 MG per tablet Take by mouth daily. Will take up five a day    . isometheptene-acetaminophen-dichloralphenazone (MIDRIN) 65-100-325 MG capsule Take 2 capsules by mouth 4 (four) times daily as needed for migraine. Maximum 5 capsules in 12 hours for migraine headaches, 8 capsules in 24 hours for tension headaches.    . lamoTRIgine (LAMICTAL) 25 MG tablet Take 25 mg by mouth 2 (two) times daily. 1 in the morning and 1 in the evening    . lisinopril (PRINIVIL,ZESTRIL) 10 MG tablet Take 10 mg by mouth daily.    . Loperamide-Simethicone (IMODIUM ADVANCED PO) Take by mouth as needed.      Marland Kitchen PRISTIQ 100 MG 24 hr tablet Take 1 tablet by mouth daily.     . promethazine (PHENERGAN) 25 MG tablet Take 1 tablet (25 mg total) by mouth as needed. 90 tablet 0  . SYNTHROID 137 MCG tablet Take 137 mcg by mouth daily before breakfast.    . tiZANidine (ZANAFLEX) 4 MG capsule Take 4 mg by mouth 3 (three) times daily as needed for muscle spasms.     No current facility-administered medications on file prior to visit.     Review of Systems: No CP, SOB, rash As per HPI- otherwise negative.   Physical Examination: Vitals:   05/28/16 1158  BP: 127/69  Pulse: 76  Temp: 99 F (37.2 C)   Vitals:   05/28/16 1158  Weight: 210 lb 6.4 oz (95.4 kg)  Height: 5\' 6"  (1.676 m)   Body mass index is 33.96 kg/m. Ideal Body Weight: Weight in (lb) to have BMI = 25: 154.6  GEN: WDWN, NAD, Non-toxic, A & O x 3, overweight, looks well HEENT: Atraumatic, Normocephalic. Neck supple. No masses, No LAD.  Bilateral TM wnl, oropharynx normal.  PEERL,EOMI.   Frontal sinuses are tender to palpation Ears and Nose: No external deformity. CV: RRR, No M/G/R. No JVD. No thrill. No extra heart sounds. PULM: CTA B, no wheezes, crackles, rhonchi. No retractions. No resp. distress.  No accessory muscle use. ABD: S, NT, ND EXTR: No c/c/e NEURO Normal gait.  PSYCH: Normally interactive. Conversant. Not depressed or anxious appearing.  Calm demeanor.   Results for orders placed or performed in visit on 05/28/16  POCT rapid strep A  Result Value Ref Range   Rapid Strep A Screen Negative Negative    Assessment and Plan: Acute non-recurrent frontal sinusitis - Plan: doxycycline (  VIBRAMYCIN) 100 MG capsule  Sore throat - Plan: POCT rapid strep A  Treat for acute sinusitis with doxycycline (PCN allergic)- continue OTC medications as needed She will let me know if not improving over the next few days-Sooner if worse.  Take abx with food and water   Signed Lamar Blinks, MD

## 2016-05-28 NOTE — Progress Notes (Signed)
Pre visit review using our clinic review tool, if applicable. No additional management support is needed unless otherwise documented below in the visit note. 

## 2016-05-31 ENCOUNTER — Other Ambulatory Visit: Payer: Self-pay | Admitting: Emergency Medicine

## 2016-06-07 DIAGNOSIS — Z79891 Long term (current) use of opiate analgesic: Secondary | ICD-10-CM | POA: Diagnosis not present

## 2016-06-07 DIAGNOSIS — G894 Chronic pain syndrome: Secondary | ICD-10-CM | POA: Diagnosis not present

## 2016-06-07 DIAGNOSIS — M179 Osteoarthritis of knee, unspecified: Secondary | ICD-10-CM | POA: Diagnosis not present

## 2016-06-07 DIAGNOSIS — M4726 Other spondylosis with radiculopathy, lumbar region: Secondary | ICD-10-CM | POA: Diagnosis not present

## 2016-06-25 ENCOUNTER — Ambulatory Visit (INDEPENDENT_AMBULATORY_CARE_PROVIDER_SITE_OTHER): Payer: Medicare Other | Admitting: Family Medicine

## 2016-06-25 ENCOUNTER — Encounter: Payer: Self-pay | Admitting: Family Medicine

## 2016-06-25 VITALS — BP 118/80 | HR 80 | Temp 98.8°F | Resp 16 | Ht 65.0 in | Wt 206.8 lb

## 2016-06-25 DIAGNOSIS — J020 Streptococcal pharyngitis: Secondary | ICD-10-CM | POA: Diagnosis not present

## 2016-06-25 DIAGNOSIS — J029 Acute pharyngitis, unspecified: Secondary | ICD-10-CM

## 2016-06-25 LAB — POCT RAPID STREP A (OFFICE): RAPID STREP A SCREEN: NEGATIVE

## 2016-06-25 MED ORDER — AZITHROMYCIN 250 MG PO TABS: ORAL_TABLET | ORAL | 0 refills | Status: DC

## 2016-06-25 NOTE — Progress Notes (Signed)
   Subjective:    Patient ID: Karen Brown, female    DOB: [birth year],    MRN:   HPI    Review of Systems  Constitutional: Negative.   HENT: Negative.   Eyes: Negative.   Respiratory: Negative.   Cardiovascular: Negative.   Gastrointestinal: Negative.   Genitourinary: Negative.   Musculoskeletal: Negative.   Skin: Negative.   Neurological: Negative.   Endo/Heme/Allergies: Negative.   Psychiatric/Behavioral: Negative.        Objective:    Physical Exam   Assessment & Plan:

## 2016-06-25 NOTE — Patient Instructions (Signed)

## 2016-06-25 NOTE — Progress Notes (Signed)
Pre visit review using our clinic review tool, if applicable. No additional management support is needed unless otherwise documented below in the visit note. 

## 2016-06-25 NOTE — Progress Notes (Signed)
Patient ID: Karen Brown, female    DOB: 1960/09/17  Age: 56 y.o. MRN: [id number]    Subjective:  Subjective  HPI Karen Brown presents for sore throat x several days ----  No other complaints.    Review of Systems  Constitutional: Negative for appetite change, diaphoresis, fatigue and unexpected weight change.  Eyes: Negative for pain, redness and visual disturbance.  Respiratory: Negative for cough, chest tightness, shortness of breath and wheezing.   Cardiovascular: Negative for chest pain, palpitations and leg swelling.  Endocrine: Negative for cold intolerance, heat intolerance, polydipsia, polyphagia and polyuria.  Genitourinary: Negative for difficulty urinating, dysuria and frequency.  Neurological: Negative for dizziness, light-headedness, numbness and headaches.    History Past Medical History:  Diagnosis Date  . Anxiety   . Bipolar 1 disorder ([place])   . Chronic migraine   . Chronically dry eyes   . Colitis, ischemic ([place])   . Depression   . Diverticulosis   . Dry mouth   . Fibromyalgia   . GERD (gastroesophageal reflux disease)   . Hemorrhoids   . Hyperlipemia   . Hypertension   . Hypothyroidism   . IBS (irritable bowel syndrome)   . PVC (premature ventricular contraction)     She has a past surgical history that includes Cholecystectomy; Abdominal hysterectomy; Tonsillectomy; Back surgery; Back surgery; and Appendectomy.   Her family history includes COPD (age of onset: 81) in her father; Colon polyps in her mother; Coronary artery disease (age of onset: 67) in her father; Heart disease in her father; Kidney disease in her father; Osteoporosis in her mother.She reports that she has never smoked. She has never used smokeless tobacco. She reports that she does not drink alcohol or use drugs.  Current Outpatient Prescriptions on File Prior to Visit  Medication Sig Dispense Refill  . Cholecalciferol (VITAMIN D-3) 5000 UNITS TABS Take 2 capsules by mouth daily.      . clidinium-chlordiazePOXIDE (LIBRAX) 5-2.5 MG per capsule TAKE ONE CAPSULE BY MOUTH 3 TIMES A DAY BEFORE MEALS AS NEEDED 90 capsule 2  . clonazePAM (KLONOPIN) 1 MG tablet Take 1 mg by mouth 3 (three) times daily as needed.      Marland Kitchen estradiol (VIVELLE-DOT) 0.075 MG/24HR Place 1 patch onto the skin once a week.     . fentaNYL (DURAGESIC - DOSED MCG/HR) 75 MCG/HR Place 1 patch onto the skin every other day.      Marland Kitchen HYDROcodone-acetaminophen (NORCO) 10-325 MG per tablet Take by mouth daily. Will take up five a day    . isometheptene-acetaminophen-dichloralphenazone (MIDRIN) 65-100-325 MG capsule Take 2 capsules by mouth 4 (four) times daily as needed for migraine. Maximum 5 capsules in 12 hours for migraine headaches, 8 capsules in 24 hours for tension headaches.    . lamoTRIgine (LAMICTAL) 25 MG tablet Take 25 mg by mouth 2 (two) times daily. 1 in the morning and 1 in the evening    . lisinopril (PRINIVIL,ZESTRIL) 10 MG tablet Take 10 mg by mouth daily.    . Loperamide-Simethicone (IMODIUM ADVANCED PO) Take by mouth as needed.      Marland Kitchen PRISTIQ 100 MG 24 hr tablet Take 1 tablet by mouth daily.     . promethazine (PHENERGAN) 25 MG tablet Take 1 tablet (25 mg total) by mouth as needed. 90 tablet 0  . SYNTHROID 137 MCG tablet Take 137 mcg by mouth daily before breakfast.    . tiZANidine (ZANAFLEX) 4 MG capsule Take 4 mg by mouth 3 (three)  times daily as needed for muscle spasms.    Marland Kitchen doxycycline (VIBRAMYCIN) 100 MG capsule Take 1 capsule (100 mg total) by mouth 2 (two) times daily. (Patient not taking: Reported on 06/25/2016) 20 capsule 0   No current facility-administered medications on file prior to visit.      Objective:  Objective  Physical Exam  Constitutional: She is oriented to person, place, and time. She appears well-developed and well-nourished.  HENT:  Head: Normocephalic and atraumatic.  Right Ear: Hearing, tympanic membrane, external ear and ear canal normal.  Left Ear: Hearing, tympanic  membrane, external ear and ear canal normal.  Nose: Rhinorrhea present. Right sinus exhibits no maxillary sinus tenderness and no frontal sinus tenderness. Left sinus exhibits no maxillary sinus tenderness and no frontal sinus tenderness.  Mouth/Throat: Mucous membranes are not pale, not dry and not cyanotic. Posterior oropharyngeal erythema present. No oropharyngeal exudate or posterior oropharyngeal edema.  Eyes: Conjunctivae and EOM are normal.  Neck: Normal range of motion. Neck supple. No JVD present. Carotid bruit is not present. No thyromegaly present.  Cardiovascular: Normal rate, regular rhythm and normal heart sounds.   No murmur heard. Pulmonary/Chest: Effort normal and breath sounds normal. No respiratory distress. She has no wheezes. She has no rales. She exhibits no tenderness.  Musculoskeletal: She exhibits no edema.  Lymphadenopathy:    She has cervical adenopathy.  Neurological: She is alert and oriented to person, place, and time.  Skin: She is not diaphoretic.  Psychiatric: She has a normal mood and affect.  Nursing note and vitals reviewed.  BP 118/80 (BP Location: Right Arm, Patient Position: Sitting, Cuff Size: Large)   Pulse 80   Temp 98.8 F (37.1 C) (Oral)   Resp 16   Ht 5\' 5"  (1.651 m)   Wt 206 lb 12.8 oz (93.8 kg)   SpO2 98%   BMI 34.41 kg/m  Wt Readings from Last 3 Encounters:  06/25/16 206 lb 12.8 oz (93.8 kg)  05/28/16 210 lb 6.4 oz (95.4 kg)  04/13/16 205 lb 8 oz (93.2 kg)     Lab Results  Component Value Date   WBC 8.2 02/25/2013   HGB 14.5 02/25/2013   HCT 42.7 02/25/2013   PLT 314.0 02/25/2013   GLUCOSE 93 06/27/2015   CHOL 279 (H) 06/27/2015   TRIG 194.0 (H) 06/27/2015   HDL 50.10 06/27/2015   LDLDIRECT 236.2 02/25/2013   LDLCALC 190 (H) 06/27/2015   ALT 22 06/27/2015   AST 24 06/27/2015   NA 140 06/27/2015   K 4.6 06/27/2015   CL 101 06/27/2015   CREATININE 0.79 06/27/2015   BUN 12 06/27/2015   CO2 33 (H) 06/27/2015   TSH 0.82  02/10/2010   HGBA1C 5.4 08/15/2007    Mm Screening Breast Tomo Bilateral  Result Date: 12/23/2015 CLINICAL DATA:  Screening. EXAM: 2D DIGITAL SCREENING BILATERAL MAMMOGRAM WITH CAD AND ADJUNCT TOMO COMPARISON:  Previous exam(s). ACR Breast Density Category b: There are scattered areas of fibroglandular density. FINDINGS: There are no findings suspicious for malignancy. Images were processed with CAD. IMPRESSION: No mammographic evidence of malignancy. A result letter of this screening mammogram will be mailed directly to the patient. RECOMMENDATION: Screening mammogram in one year. (Code:SM-B-01Y) BI-RADS CATEGORY  1: Negative. Electronically Signed   By: Everlean Alstrom M.D.   On: 12/23/2015 17:04     Assessment & Plan:  Plan  I am having Ms. Kristensen maintain her lamoTRIgine, HYDROcodone-acetaminophen, PRISTIQ, clonazePAM, Vitamin D-3, fentaNYL, Loperamide-Simethicone (IMODIUM ADVANCED PO), estradiol, tiZANidine,  lisinopril, isometheptene-acetaminophen-dichloralphenazone, clidinium-chlordiazePOXIDE, SYNTHROID, promethazine, doxycycline, cetirizine, and azithromycin.  Meds ordered this encounter  Medications  . cetirizine (ZYRTEC) 10 MG tablet    Sig: Take 10 mg by mouth daily.  Marland Kitchen DISCONTD: azithromycin (ZITHROMAX Z-PAK) 250 MG tablet    Sig: Take 2 Tablets first day, then 1 Tab daily for next 5 days.    Dispense:  7 each    Refill:  0  . azithromycin (ZITHROMAX Z-PAK) 250 MG tablet    Sig: Take 2 Tablets first day, then 1 Tab daily for next 5 days.    Dispense:  7 each    Refill:  0    Problem List Items Addressed This Visit      Unprioritized   ACUTE PHARYNGITIS   Relevant Orders   Culture, Group A Strep    Other Visit Diagnoses    Streptococcal sore throat    -  Primary   Relevant Medications   azithromycin (ZITHROMAX Z-PAK) 250 MG tablet   Other Relevant Orders   POCT rapid strep A (Completed)   Culture, Group A Strep    strep neg Throat culture done  Follow-up: Return  if symptoms worsen or fail to improve.  Ann Held, DO

## 2016-06-26 DIAGNOSIS — J029 Acute pharyngitis, unspecified: Secondary | ICD-10-CM | POA: Diagnosis not present

## 2016-06-26 DIAGNOSIS — J020 Streptococcal pharyngitis: Secondary | ICD-10-CM | POA: Diagnosis not present

## 2016-06-27 LAB — CULTURE, GROUP A STREP: Organism ID, Bacteria: NORMAL

## 2016-06-29 ENCOUNTER — Telehealth: Payer: Self-pay | Admitting: *Deleted

## 2016-06-29 NOTE — Progress Notes (Signed)
Pre visit review using our clinic review tool, if applicable. No additional management support is needed unless otherwise documented below in the visit note. 

## 2016-06-29 NOTE — Telephone Encounter (Signed)
Scheduled for 07/02/16 @1 .

## 2016-06-29 NOTE — Progress Notes (Signed)
Subjective:   Karen Brown is a 56 y.o. female who presents for Medicare Annual (Subsequent) preventive examination.  Review of Systems:  No ROS.  Medicare Wellness Visit. Cardiac Risk Factors include: advanced age (>108men, >66 women);hypertension;obesity (BMI >30kg/m2);sedentary lifestyle Sleep patterns: Wakes up once per night to urinate. Restless sleep. Feels rested.   Home Safety/Smoke Alarms: Feels safe in home. Smoke alarms in place.   Living environment; residence and Firearm Safety: Lives at home with husband. 1 story. No guns. Seat Belt Safety/Bike Helmet: Wears seat belt.   Counseling:   Eye Exam- Wears glasses for driving. Follows with [facility] annually. Dental- Visits every 6 months. [facility])  Female:   Pap- Pt declines-states partial hysterectomy in 1993.      Mammo- Last 12/23/15:BI-RADS CATEGORY  1: Negative.      Dexa scan-  Last 03/17/13: Osteopenia.  Repeat ordered today.    CCS- Last 12/09/09 per external report-no result on file.     Objective:     Vitals: BP 128/68 (BP Location: Right Arm, Patient Position: Sitting, Cuff Size: Normal)   Pulse 89   Ht 5\' 5"  (1.651 m)   Wt 206 lb 9.6 oz (93.7 kg)   SpO2 98%   BMI 34.38 kg/m   Body mass index is 34.38 kg/m.   Tobacco History  Smoking Status  . Never Smoker  Smokeless Tobacco  . Never Used     Counseling given: No   Past Medical History:  Diagnosis Date  . Anxiety   . Bipolar 1 disorder ([place])   . Chronic migraine   . Chronically dry eyes   . Colitis, ischemic ([place])   . Depression   . Diverticulosis   . Dry mouth   . Fibromyalgia   . GERD (gastroesophageal reflux disease)   . Hemorrhoids   . Hyperlipemia   . Hypertension   . Hypothyroidism   . IBS (irritable bowel syndrome)   . PVC (premature ventricular contraction)    Past Surgical History:  Procedure Laterality Date  . ABDOMINAL HYSTERECTOMY     Partial   . APPENDECTOMY    . BACK SURGERY     Micro diskectomy    . BACK SURGERY     L facet rhzotomy  . CHOLECYSTECTOMY    . TONSILLECTOMY     Family History  Problem Relation Age of Onset  . Kidney disease Father   . Coronary artery disease Father 17  . COPD Father 57    copd  . Heart disease Father   . Colon polyps Mother   . Osteoporosis Mother   . Colon cancer Neg Hx    History  Sexual Activity  . Sexual activity: Yes    Outpatient Encounter Prescriptions as of 07/02/2016  Medication Sig  . azithromycin (ZITHROMAX Z-PAK) 250 MG tablet Take 2 Tablets first day, then 1 Tab daily for next 5 days.  . cetirizine (ZYRTEC) 10 MG tablet Take 10 mg by mouth daily.  . Cholecalciferol (VITAMIN D-3) 5000 UNITS TABS Take 2 capsules by mouth daily.   . clidinium-chlordiazePOXIDE (LIBRAX) 5-2.5 MG per capsule TAKE ONE CAPSULE BY MOUTH 3 TIMES A DAY BEFORE MEALS AS NEEDED  . clonazePAM (KLONOPIN) 1 MG tablet Take 1 mg by mouth 3 (three) times daily as needed.    . doxycycline (VIBRAMYCIN) 100 MG capsule Take 1 capsule (100 mg total) by mouth 2 (two) times daily. (Patient not taking: Reported on 06/25/2016)  . estradiol (VIVELLE-DOT) 0.075 MG/24HR Place 1  patch onto the skin once a week.   . fentaNYL (DURAGESIC - DOSED MCG/HR) 75 MCG/HR Place 1 patch onto the skin every other day.    Marland Kitchen HYDROcodone-acetaminophen (NORCO) 10-325 MG per tablet Take by mouth daily. Will take up five a day  . isometheptene-acetaminophen-dichloralphenazone (MIDRIN) 65-100-325 MG capsule Take 2 capsules by mouth 4 (four) times daily as needed for migraine. Maximum 5 capsules in 12 hours for migraine headaches, 8 capsules in 24 hours for tension headaches.  . lamoTRIgine (LAMICTAL) 25 MG tablet Take 25 mg by mouth 2 (two) times daily. 1 in the morning and 1 in the evening  . lisinopril (PRINIVIL,ZESTRIL) 10 MG tablet Take 10 mg by mouth daily.  . Loperamide-Simethicone (IMODIUM ADVANCED PO) Take by mouth as needed.    Marland Kitchen PRISTIQ 100 MG 24 hr tablet Take 1 tablet by mouth daily.   .  promethazine (PHENERGAN) 25 MG tablet Take 1 tablet (25 mg total) by mouth as needed.  Marland Kitchen SYNTHROID 137 MCG tablet Take 137 mcg by mouth daily before breakfast.  . tiZANidine (ZANAFLEX) 4 MG capsule Take 4 mg by mouth 3 (three) times daily as needed for muscle spasms.   No facility-administered encounter medications on file as of 07/02/2016.     Activities of Daily Living In your present state of health, do you have any difficulty performing the following activities: 07/02/2016  Hearing? N  Vision? N  Difficulty concentrating or making decisions? N  Walking or climbing stairs? N  Dressing or bathing? N  Doing errands, shopping? N  Preparing Food and eating ? N  Using the Toilet? N  In the past six months, have you accidently leaked urine? N  Do you have problems with loss of bowel control? N  Managing your Medications? N  Managing your Finances? N  Housekeeping or managing your Housekeeping? Y  Some recent data might be hidden    Patient Care Team: Ann Held, DO as PCP - General Nicholaus Bloom, MD as Consulting Physician (Anesthesiology) Jacelyn Pi, MD as Consulting Physician (Endocrinology) Phylliss Bob, MD as Consulting Physician (Orthopedic Surgery)    Assessment:    Physical assessment deferred to PCP.  Exercise Activities and Dietary recommendations Current Exercise Habits: The patient does not participate in regular exercise at present, Exercise limited by: orthopedic condition(s)   Diet (meal preparation, eat out, water intake, caffeinated beverages, dairy products, fruits and vegetables): in general, a "healthy" diet        Goals    . to be doing better than this year in general health.       Fall Risk Fall Risk  07/02/2016 06/28/2015  Falls in the past year? No No   Depression Screen PHQ 2/9 Scores 07/02/2016 06/28/2015  PHQ - 2 Score 0 0     Cognitive Function MMSE - Mini Mental State Exam 07/02/2016  Orientation to time 5  Orientation to  Place 5  Registration 3  Attention/ Calculation 5  Recall 3  Language- name 2 objects 2  Language- repeat 1  Language- follow 3 step command 3  Language- read & follow direction 1  Write a sentence 1  Copy design 1  Total score 30        Immunization History  Administered Date(s) Administered  . Influenza Split 04/09/2011, 04/03/2012  . Influenza Whole 03/15/2010  . Influenza,inj,Quad PF,36+ Mos 04/07/2013, 03/26/2014, 03/22/2015, 04/13/2016  . Td 01/23/2007   Screening Tests Health Maintenance  Topic Date Due  . Hepatitis C  Screening  25-Jul-1960  . HIV Screening  03/11/1974  . TETANUS/TDAP  01/22/2017  . MAMMOGRAM  12/22/2017  . COLONOSCOPY  12/10/2019  . INFLUENZA VACCINE  Completed      Plan:     Follow up with Dr.Lowne as scheduled. Continue to eat heart healthy diet (full of fruits, vegetables, whole grains, lean protein, water--limit salt, fat, and sugar intake) and increase physical activity as tolerated.  During the course of the visit the patient was educated and counseled about the following appropriate screening and preventive services:   Vaccines to include Pneumoccal, Influenza, Hepatitis B, Td, Zostavax, HCV  Cardiovascular Disease  Colorectal cancer screening  Bone density screening  Diabetes screening  Glaucoma screening  Mammography/PAP  Nutrition counseling   Patient Instructions (the written plan) was given to the patient.   Shela Nevin, [place]  07/02/2016

## 2016-07-02 ENCOUNTER — Encounter: Payer: Medicare Other | Admitting: Family Medicine

## 2016-07-02 ENCOUNTER — Encounter: Payer: Self-pay | Admitting: Family Medicine

## 2016-07-02 ENCOUNTER — Ambulatory Visit (INDEPENDENT_AMBULATORY_CARE_PROVIDER_SITE_OTHER): Payer: Medicare Other | Admitting: Family Medicine

## 2016-07-02 VITALS — BP 128/68 | HR 89 | Ht 65.0 in | Wt 206.6 lb

## 2016-07-02 DIAGNOSIS — Z0000 Encounter for general adult medical examination without abnormal findings: Secondary | ICD-10-CM

## 2016-07-02 DIAGNOSIS — Z780 Asymptomatic menopausal state: Secondary | ICD-10-CM | POA: Diagnosis not present

## 2016-07-02 DIAGNOSIS — E785 Hyperlipidemia, unspecified: Secondary | ICD-10-CM | POA: Diagnosis not present

## 2016-07-02 MED ORDER — ISOMETHEPTENE-DICHLORAL-APAP 65-100-325 MG PO CAPS: 2.0000 | ORAL_CAPSULE | Freq: Four times a day (QID) | ORAL | 1 refills | Status: DC | PRN

## 2016-07-02 NOTE — Assessment & Plan Note (Signed)
ghm utd Check labs See AVS 

## 2016-07-02 NOTE — Progress Notes (Signed)
Subjective:    Patient ID: Karen Brown, female    DOB: 1961-02-09, 56 y.o.   MRN: [id number]  Chief Complaint  Patient presents with  . Medicare Wellness    HPI Patient is in today for Medicare Wellness. And cpe.   No complaints.    Past Medical History:  Diagnosis Date  . Anxiety   . Bipolar 1 disorder ([place])   . Chronic migraine   . Chronically dry eyes   . Colitis, ischemic ([place])   . Depression   . Diverticulosis   . Dry mouth   . Fibromyalgia   . GERD (gastroesophageal reflux disease)   . Hemorrhoids   . Hyperlipemia   . Hypertension   . Hypothyroidism   . IBS (irritable bowel syndrome)   . PVC (premature ventricular contraction)     Past Surgical History:  Procedure Laterality Date  . ABDOMINAL HYSTERECTOMY     Partial   . APPENDECTOMY    . BACK SURGERY     Micro diskectomy  . BACK SURGERY     L facet rhzotomy  . CHOLECYSTECTOMY    . TONSILLECTOMY      Family History  Problem Relation Age of Onset  . Kidney disease Father   . Coronary artery disease Father 57  . COPD Father 63    copd  . Heart disease Father   . Colon polyps Mother   . Osteoporosis Mother   . Colon cancer Neg Hx     Social History   Social History  . Marital status: Married    Spouse name: N/A  . Number of children: 0  . Years of education: N/A   Occupational History  . Disabled  Disability   Social History Main Topics  . Smoking status: Never Smoker  . Smokeless tobacco: Never Used  . Alcohol use No  . Drug use: No  . Sexual activity: Yes   Other Topics Concern  . Not on file   Social History Narrative   Caffeine occ     Outpatient Medications Prior to Visit  Medication Sig Dispense Refill  . azithromycin (ZITHROMAX Z-PAK) 250 MG tablet Take 2 Tablets first day, then 1 Tab daily for next 5 days. 7 each 0  . cetirizine (ZYRTEC) 10 MG tablet Take 10 mg by mouth daily.    . Cholecalciferol (VITAMIN D-3) 5000 UNITS TABS Take 2 capsules by mouth daily.     .  clidinium-chlordiazePOXIDE (LIBRAX) 5-2.5 MG per capsule TAKE ONE CAPSULE BY MOUTH 3 TIMES A DAY BEFORE MEALS AS NEEDED 90 capsule 2  . clonazePAM (KLONOPIN) 1 MG tablet Take 1 mg by mouth 3 (three) times daily as needed.      . doxycycline (VIBRAMYCIN) 100 MG capsule Take 1 capsule (100 mg total) by mouth 2 (two) times daily. (Patient not taking: Reported on 06/25/2016) 20 capsule 0  . estradiol (VIVELLE-DOT) 0.075 MG/24HR Place 1 patch onto the skin once a week.     . fentaNYL (DURAGESIC - DOSED MCG/HR) 75 MCG/HR Place 1 patch onto the skin every other day.      Marland Kitchen HYDROcodone-acetaminophen (NORCO) 10-325 MG per tablet Take by mouth daily. Will take up five a day    . lamoTRIgine (LAMICTAL) 25 MG tablet Take 25 mg by mouth 2 (two) times daily. 1 in the morning and 1 in the evening    . lisinopril (PRINIVIL,ZESTRIL) 10 MG tablet Take 10 mg by mouth daily.    . Loperamide-Simethicone (IMODIUM ADVANCED PO)  Take by mouth as needed.      Marland Kitchen PRISTIQ 100 MG 24 hr tablet Take 1 tablet by mouth daily.     . promethazine (PHENERGAN) 25 MG tablet Take 1 tablet (25 mg total) by mouth as needed. 90 tablet 0  . SYNTHROID 137 MCG tablet Take 137 mcg by mouth daily before breakfast.    . tiZANidine (ZANAFLEX) 4 MG capsule Take 4 mg by mouth 3 (three) times daily as needed for muscle spasms.    Marland Kitchen isometheptene-acetaminophen-dichloralphenazone (MIDRIN) 65-100-325 MG capsule Take 2 capsules by mouth 4 (four) times daily as needed for migraine. Maximum 5 capsules in 12 hours for migraine headaches, 8 capsules in 24 hours for tension headaches.     No facility-administered medications prior to visit.     Allergies  Allergen Reactions  . Biaxin [Clarithromycin] Other (See Comments)    Bad taste and ringing in ear with dec hearing  . Ketorolac Tromethamine   . Pb-Hyoscy-Atropine-Scopolamine   . Penicillins     REACTION: HIVES  . Phenobarbital   . Sulfonamide Derivatives   . Topiramate   . Zaleplon     Review  of Systems  Constitutional: Negative for fever.  HENT: Negative.  Negative for congestion.   Eyes: Negative for blurred vision.  Respiratory: Negative for cough.   Cardiovascular: Negative for chest pain and palpitations.  Gastrointestinal: Negative for vomiting.  Genitourinary: Negative.   Musculoskeletal: Negative.  Negative for back pain.  Skin: Negative.  Negative for rash.  Neurological: Negative.  Negative for loss of consciousness and headaches.  Endo/Heme/Allergies: Negative.   Psychiatric/Behavioral: Negative.        Objective:    Physical Exam  Constitutional: She is oriented to person, place, and time. She appears well-developed and well-nourished. No distress.  HENT:  Right Ear: External ear normal.  Left Ear: External ear normal.  Nose: Nose normal.  Mouth/Throat: Oropharynx is clear and moist.  Eyes: EOM are normal. Pupils are equal, round, and reactive to light.  Neck: Normal range of motion. Neck supple.  Cardiovascular: Normal rate, regular rhythm and normal heart sounds.   No murmur heard. Pulmonary/Chest: Effort normal and breath sounds normal. No respiratory distress. She has no wheezes. She has no rales. She exhibits no tenderness.  Neurological: She is alert and oriented to person, place, and time.  Psychiatric: She has a normal mood and affect. Her behavior is normal. Judgment and thought content normal.  Nursing note and vitals reviewed.   BP 128/68 (BP Location: Right Arm, Patient Position: Sitting, Cuff Size: Normal)   Pulse 89   Ht 5\' 5"  (1.651 m)   Wt 206 lb 9.6 oz (93.7 kg)   SpO2 98%   BMI 34.38 kg/m  Wt Readings from Last 3 Encounters:  07/02/16 206 lb 9.6 oz (93.7 kg)  06/25/16 206 lb 12.8 oz (93.8 kg)  05/28/16 210 lb 6.4 oz (95.4 kg)     Lab Results  Component Value Date   WBC 8.2 02/25/2013   HGB 14.5 02/25/2013   HCT 42.7 02/25/2013   PLT 314.0 02/25/2013   GLUCOSE 93 06/27/2015   CHOL 279 (H) 06/27/2015   TRIG 194.0 (H)  06/27/2015   HDL 50.10 06/27/2015   LDLDIRECT 236.2 02/25/2013   LDLCALC 190 (H) 06/27/2015   ALT 22 06/27/2015   AST 24 06/27/2015   NA 140 06/27/2015   K 4.6 06/27/2015   CL 101 06/27/2015   CREATININE 0.79 06/27/2015   BUN 12 06/27/2015  CO2 33 (H) 06/27/2015   TSH 0.82 02/10/2010   HGBA1C 5.4 08/15/2007    Lab Results  Component Value Date   TSH 0.82 02/10/2010   Lab Results  Component Value Date   WBC 8.2 02/25/2013   HGB 14.5 02/25/2013   HCT 42.7 02/25/2013   MCV 85.0 02/25/2013   PLT 314.0 02/25/2013   Lab Results  Component Value Date   NA 140 06/27/2015   K 4.6 06/27/2015   CO2 33 (H) 06/27/2015   GLUCOSE 93 06/27/2015   BUN 12 06/27/2015   CREATININE 0.79 06/27/2015   BILITOT 0.5 06/27/2015   ALKPHOS 76 06/27/2015   AST 24 06/27/2015   ALT 22 06/27/2015   PROT 7.9 06/27/2015   ALBUMIN 4.7 06/27/2015   CALCIUM 9.9 06/27/2015   GFR 79.93 06/27/2015   Lab Results  Component Value Date   CHOL 279 (H) 06/27/2015   Lab Results  Component Value Date   HDL 50.10 06/27/2015   Lab Results  Component Value Date   LDLCALC 190 (H) 06/27/2015   Lab Results  Component Value Date   TRIG 194.0 (H) 06/27/2015   Lab Results  Component Value Date   CHOLHDL 6 06/27/2015   Lab Results  Component Value Date   HGBA1C 5.4 08/15/2007       Assessment & Plan:   Problem List Items Addressed This Visit      Unprioritized   Preventative health care    ghm utd Check labs See AVS       Other Visit Diagnoses    Hyperlipidemia, unspecified hyperlipidemia type    -  Primary   Relevant Orders   Lipid panel   Comprehensive metabolic panel   Medicare annual wellness visit, subsequent       Postmenopausal       Relevant Orders   DG Bone Density      I am having Ms. Tesmer maintain her lamoTRIgine, HYDROcodone-acetaminophen, PRISTIQ, clonazePAM, Vitamin D-3, fentaNYL, Loperamide-Simethicone (IMODIUM ADVANCED PO), estradiol, tiZANidine, lisinopril,  clidinium-chlordiazePOXIDE, SYNTHROID, promethazine, doxycycline, cetirizine, azithromycin, and isometheptene-acetaminophen-dichloralphenazone.  Meds ordered this encounter  Medications  . isometheptene-acetaminophen-dichloralphenazone (MIDRIN) 65-100-325 MG capsule    Sig: Take 2 capsules by mouth 4 (four) times daily as needed for migraine. Maximum 5 capsules in 12 hours for migraine headaches, 8 capsules in 24 hours for tension headaches.    Dispense:  30 capsule    Refill:  1    CMA served as scribe during this visit. History, Physical and Plan performed by medical provider. Documentation and orders reviewed and attested to.   Ann Held, DO

## 2016-07-02 NOTE — Patient Instructions (Signed)

## 2016-07-04 ENCOUNTER — Other Ambulatory Visit (INDEPENDENT_AMBULATORY_CARE_PROVIDER_SITE_OTHER): Payer: Medicare Other

## 2016-07-04 DIAGNOSIS — E785 Hyperlipidemia, unspecified: Secondary | ICD-10-CM | POA: Diagnosis not present

## 2016-07-04 DIAGNOSIS — G894 Chronic pain syndrome: Secondary | ICD-10-CM | POA: Diagnosis not present

## 2016-07-04 DIAGNOSIS — M4726 Other spondylosis with radiculopathy, lumbar region: Secondary | ICD-10-CM | POA: Diagnosis not present

## 2016-07-04 DIAGNOSIS — Z79891 Long term (current) use of opiate analgesic: Secondary | ICD-10-CM | POA: Diagnosis not present

## 2016-07-04 DIAGNOSIS — M179 Osteoarthritis of knee, unspecified: Secondary | ICD-10-CM | POA: Diagnosis not present

## 2016-07-04 LAB — LIPID PANEL
CHOL/HDL RATIO: 5
CHOLESTEROL: 319 mg/dL — AB (ref 0–200)
HDL: 58.6 mg/dL (ref >39.00)
NonHDL: 260.83
Triglycerides: 205 mg/dL — ABNORMAL HIGH (ref 0.0–149.0)
VLDL: 41 mg/dL — ABNORMAL HIGH (ref 0.0–40.0)

## 2016-07-04 LAB — COMPREHENSIVE METABOLIC PANEL
ALT: 21 U/L (ref 0–35)
AST: 19 U/L (ref 0–37)
Albumin: 4.6 g/dL (ref 3.5–5.2)
Alkaline Phosphatase: 72 U/L (ref 39–117)
BILIRUBIN TOTAL: 0.5 mg/dL (ref 0.2–1.2)
BUN: 11 mg/dL (ref 6–23)
CALCIUM: 9.8 mg/dL (ref 8.4–10.5)
CO2: 32 meq/L (ref 19–32)
CREATININE: 0.8 mg/dL (ref 0.40–1.20)
Chloride: 102 mEq/L (ref 96–112)
GFR: 78.49 mL/min (ref >60.00)
GLUCOSE: 94 mg/dL (ref 70–99)
Potassium: 4 mEq/L (ref 3.5–5.1)
Sodium: 142 mEq/L (ref 135–145)
Total Protein: 7.7 g/dL (ref 6.0–8.3)

## 2016-07-04 LAB — LDL CHOLESTEROL, DIRECT: Direct LDL: 217 mg/dL

## 2016-07-06 LAB — CBC WITH DIFFERENTIAL/PLATELET
Basophils Absolute: 0 10*3/uL (ref 0.0–0.1)
Basophils Relative: 0.4 % (ref 0.0–3.0)
EOS PCT: 1.2 % (ref 0.0–5.0)
Eosinophils Absolute: 0.1 10*3/uL (ref 0.0–0.7)
HCT: 45.1 % (ref 36.0–46.0)
Hemoglobin: 15.2 g/dL — ABNORMAL HIGH (ref 12.0–15.0)
LYMPHS ABS: 2 10*3/uL (ref 0.7–4.0)
Lymphocytes Relative: 26.9 % (ref 12.0–46.0)
MCHC: 33.7 g/dL (ref 30.0–36.0)
MCV: 83.9 fl (ref 78.0–100.0)
MONO ABS: 0.3 10*3/uL (ref 0.1–1.0)
MONOS PCT: 3.4 % (ref 3.0–12.0)
NEUTROS ABS: 5.1 10*3/uL (ref 1.4–7.7)
NEUTROS PCT: 68.1 % (ref 43.0–77.0)
PLATELETS: 279 10*3/uL (ref 150.0–400.0)
RBC: 5.37 Mil/uL — AB (ref 3.87–5.11)
RDW: 12.9 % (ref 11.5–15.5)
WBC: 7.5 10*3/uL (ref 4.0–10.5)

## 2016-07-06 NOTE — Addendum Note (Signed)
Addended by: Peggyann Shoals on: 07/06/2016 11:03 AM   Modules accepted: Orders

## 2016-07-11 ENCOUNTER — Telehealth: Payer: Self-pay | Admitting: Family Medicine

## 2016-07-11 NOTE — Telephone Encounter (Signed)
Patient is calling in regard to medication for 30 pill with 1 refill [facility] to [facility] [address] for midrin no (strength given) and as need state she and provider talked about this medication at last visit but it was never sent in to the pharmacy. patient call back is [phone] cell

## 2016-07-11 NOTE — Telephone Encounter (Signed)
Called the patient and this medication was sent in on 07/02/16 at her appt., but pharmacy never received. I called [facility] and phoned in the prescription for this patient. She was very appreciative of this to be done as had attempted to call/faxed sent from pharmacy, but no one had responded .  I did apologize for the inconvenience.

## 2016-07-12 ENCOUNTER — Ambulatory Visit (HOSPITAL_BASED_OUTPATIENT_CLINIC_OR_DEPARTMENT_OTHER)
Admission: RE | Admit: 2016-07-12 | Discharge: 2016-07-12 | Disposition: A | Discharge status: Home / Self Care | Payer: Medicare Other | Source: Ambulatory Visit | Attending: Family Medicine | Admitting: Family Medicine

## 2016-07-12 DIAGNOSIS — Z780 Asymptomatic menopausal state: Secondary | ICD-10-CM | POA: Diagnosis not present

## 2016-07-12 DIAGNOSIS — M85852 Other specified disorders of bone density and structure, left thigh: Secondary | ICD-10-CM | POA: Diagnosis not present

## 2016-07-18 ENCOUNTER — Ambulatory Visit (INDEPENDENT_AMBULATORY_CARE_PROVIDER_SITE_OTHER): Payer: 59 | Admitting: Psychology

## 2016-07-18 DIAGNOSIS — F331 Major depressive disorder, recurrent, moderate: Secondary | ICD-10-CM | POA: Diagnosis not present

## 2016-08-02 DIAGNOSIS — E559 Vitamin D deficiency, unspecified: Secondary | ICD-10-CM | POA: Diagnosis not present

## 2016-08-02 DIAGNOSIS — G894 Chronic pain syndrome: Secondary | ICD-10-CM | POA: Diagnosis not present

## 2016-08-02 DIAGNOSIS — M179 Osteoarthritis of knee, unspecified: Secondary | ICD-10-CM | POA: Diagnosis not present

## 2016-08-02 DIAGNOSIS — Z79891 Long term (current) use of opiate analgesic: Secondary | ICD-10-CM | POA: Diagnosis not present

## 2016-08-02 DIAGNOSIS — E039 Hypothyroidism, unspecified: Secondary | ICD-10-CM | POA: Diagnosis not present

## 2016-08-02 DIAGNOSIS — M4726 Other spondylosis with radiculopathy, lumbar region: Secondary | ICD-10-CM | POA: Diagnosis not present

## 2016-08-29 ENCOUNTER — Ambulatory Visit (INDEPENDENT_AMBULATORY_CARE_PROVIDER_SITE_OTHER): Payer: 59 | Admitting: Psychology

## 2016-08-29 DIAGNOSIS — F331 Major depressive disorder, recurrent, moderate: Secondary | ICD-10-CM | POA: Diagnosis not present

## 2016-08-30 DIAGNOSIS — M4726 Other spondylosis with radiculopathy, lumbar region: Secondary | ICD-10-CM | POA: Diagnosis not present

## 2016-08-30 DIAGNOSIS — M179 Osteoarthritis of knee, unspecified: Secondary | ICD-10-CM | POA: Diagnosis not present

## 2016-08-30 DIAGNOSIS — G894 Chronic pain syndrome: Secondary | ICD-10-CM | POA: Diagnosis not present

## 2016-08-30 DIAGNOSIS — Z79891 Long term (current) use of opiate analgesic: Secondary | ICD-10-CM | POA: Diagnosis not present

## 2016-09-27 DIAGNOSIS — M179 Osteoarthritis of knee, unspecified: Secondary | ICD-10-CM | POA: Diagnosis not present

## 2016-09-27 DIAGNOSIS — G894 Chronic pain syndrome: Secondary | ICD-10-CM | POA: Diagnosis not present

## 2016-09-27 DIAGNOSIS — Z79891 Long term (current) use of opiate analgesic: Secondary | ICD-10-CM | POA: Diagnosis not present

## 2016-09-27 DIAGNOSIS — M4726 Other spondylosis with radiculopathy, lumbar region: Secondary | ICD-10-CM | POA: Diagnosis not present

## 2016-10-11 ENCOUNTER — Ambulatory Visit (INDEPENDENT_AMBULATORY_CARE_PROVIDER_SITE_OTHER): Payer: 59 | Admitting: Psychology

## 2016-10-11 DIAGNOSIS — F331 Major depressive disorder, recurrent, moderate: Secondary | ICD-10-CM

## 2016-10-24 DIAGNOSIS — Z79891 Long term (current) use of opiate analgesic: Secondary | ICD-10-CM | POA: Diagnosis not present

## 2016-10-24 DIAGNOSIS — G894 Chronic pain syndrome: Secondary | ICD-10-CM | POA: Diagnosis not present

## 2016-10-24 DIAGNOSIS — M179 Osteoarthritis of knee, unspecified: Secondary | ICD-10-CM | POA: Diagnosis not present

## 2016-10-24 DIAGNOSIS — M4726 Other spondylosis with radiculopathy, lumbar region: Secondary | ICD-10-CM | POA: Diagnosis not present

## 2016-11-07 ENCOUNTER — Encounter: Payer: Self-pay | Admitting: Medical

## 2016-11-07 ENCOUNTER — Telehealth: Payer: Self-pay | Admitting: Medical

## 2016-11-07 ENCOUNTER — Ambulatory Visit (INDEPENDENT_AMBULATORY_CARE_PROVIDER_SITE_OTHER): Payer: Medicare Other | Admitting: Medical

## 2016-11-07 VITALS — BP 114/70 | HR 84 | Temp 98.8°F | Resp 16 | Ht 65.0 in | Wt 202.8 lb

## 2016-11-07 DIAGNOSIS — R319 Hematuria, unspecified: Secondary | ICD-10-CM

## 2016-11-07 DIAGNOSIS — R300 Dysuria: Secondary | ICD-10-CM | POA: Diagnosis not present

## 2016-11-07 LAB — POC URINALSYSI DIPSTICK (AUTOMATED)
BILIRUBIN UA: NEGATIVE
GLUCOSE UA: NEGATIVE
Ketones, UA: NEGATIVE
Nitrite, UA: NEGATIVE
Protein, UA: NEGATIVE
UROBILINOGEN UA: NEGATIVE U/dL — AB
pH, UA: 7 (ref 5.0–8.0)

## 2016-11-07 MED ORDER — CIPROFLOXACIN HCL 500 MG PO TABS: 500.0000 mg | ORAL_TABLET | Freq: Two times a day (BID) | ORAL | 0 refills | Status: DC

## 2016-11-07 MED ORDER — FLUCONAZOLE 150 MG PO TABS: 150.0000 mg | ORAL_TABLET | Freq: Once | ORAL | 1 refills | Status: AC

## 2016-11-07 NOTE — Telephone Encounter (Signed)
Future urine order placed. °

## 2016-11-07 NOTE — Progress Notes (Signed)
Subjective:    Patient ID: Karen Brown, female    DOB: 20-Sep-1960, 56 y.o.   MRN: [id number]  HPI  Pt in states urinary symptoms since Sunday.   Pt in today reporting urinary symptoms.  Dysuria-  Moderate amount since Sunday. Frequent urination-yes Hesitancy-no Suprapubic pressure-mild. Fever- no chills-but only after wisdom teeth extraction. Nausea-no Vomiting-no CVA pain-no  History of UTI- about one infection every other year. Gross hematuria- some seen by pt early today.   Pt also give me recent history had wisdom teeth surgery on Oct 29, 2016. She was given clindamycin 300 mg 4 times a day. She had diarrhea within first 2 days. So she told dentist and they told her to stop. About one 8 days since antibiotic stopped.  No diarrhea for one week  Review of Systems  Constitutional: Negative for chills, fatigue and fever.  Respiratory: Negative for cough, chest tightness, shortness of breath and wheezing.   Cardiovascular: Negative for chest pain and palpitations.  Gastrointestinal: Negative for abdominal pain.  Genitourinary: Positive for dysuria, frequency and urgency. Negative for flank pain and hematuria.  Musculoskeletal:       No cva pain.  Skin: Negative for rash.  Neurological: Negative for dizziness, seizures, syncope, weakness and headaches.  Hematological: Negative for adenopathy. Does not bruise/bleed easily.  Psychiatric/Behavioral: Negative for behavioral problems and confusion.    Past Medical History:  Diagnosis Date  . Anxiety   . Bipolar 1 disorder ([place])   . Chronic migraine   . Chronically dry eyes   . Colitis, ischemic ([place])   . Depression   . Diverticulosis   . Dry mouth   . Fibromyalgia   . GERD (gastroesophageal reflux disease)   . Hemorrhoids   . Hyperlipemia   . Hypertension   . Hypothyroidism   . IBS (irritable bowel syndrome)   . PVC (premature ventricular contraction)      Social History   Social History  . Marital  status: Married    Spouse name: N/A  . Number of children: 0  . Years of education: N/A   Occupational History  . Disabled  Disability   Social History Main Topics  . Smoking status: Never Smoker  . Smokeless tobacco: Never Used  . Alcohol use No  . Drug use: No  . Sexual activity: Yes   Other Topics Concern  . Not on file   Social History Narrative   Caffeine occ     Past Surgical History:  Procedure Laterality Date  . ABDOMINAL HYSTERECTOMY     Partial   . APPENDECTOMY    . BACK SURGERY     Micro diskectomy  . BACK SURGERY     L facet rhzotomy  . CHOLECYSTECTOMY    . TONSILLECTOMY      Family History  Problem Relation Age of Onset  . Kidney disease Father   . Coronary artery disease Father 52  . COPD Father 83       copd  . Heart disease Father   . Colon polyps Mother   . Osteoporosis Mother   . Colon cancer Neg Hx     Allergies  Allergen Reactions  . Biaxin [Clarithromycin] Other (See Comments)    Bad taste and ringing in ear with dec hearing  . Clindamycin/Lincomycin Dermatitis  . Ketorolac Tromethamine   . Pb-Hyoscy-Atropine-Scopolamine   . Penicillins     REACTION: HIVES  . Phenobarbital   . Sulfonamide Derivatives   . Topiramate   .  Zaleplon     Current Outpatient Prescriptions on File Prior to Visit  Medication Sig Dispense Refill  . Cholecalciferol (VITAMIN D-3) 5000 UNITS TABS Take 2 capsules by mouth daily.     . clidinium-chlordiazePOXIDE (LIBRAX) 5-2.5 MG per capsule TAKE ONE CAPSULE BY MOUTH 3 TIMES A DAY BEFORE MEALS AS NEEDED 90 capsule 2  . clonazePAM (KLONOPIN) 1 MG tablet Take 1 mg by mouth 3 (three) times daily as needed.      Marland Kitchen estradiol (VIVELLE-DOT) 0.075 MG/24HR Place 1 patch onto the skin once a week.     . fentaNYL (DURAGESIC - DOSED MCG/HR) 75 MCG/HR Place 1 patch onto the skin every other day.      Marland Kitchen HYDROcodone-acetaminophen (NORCO) 10-325 MG per tablet Take by mouth daily. Will take up five a day    .  isometheptene-acetaminophen-dichloralphenazone (MIDRIN) 65-100-325 MG capsule Take 2 capsules by mouth 4 (four) times daily as needed for migraine. Maximum 5 capsules in 12 hours for migraine headaches, 8 capsules in 24 hours for tension headaches. 30 capsule 1  . lamoTRIgine (LAMICTAL) 25 MG tablet Take 25 mg by mouth 2 (two) times daily. 1 in the morning and 1 in the evening    . Loperamide-Simethicone (IMODIUM ADVANCED PO) Take by mouth as needed.      Marland Kitchen PRISTIQ 100 MG 24 hr tablet Take 1 tablet by mouth daily.     . promethazine (PHENERGAN) 25 MG tablet Take 1 tablet (25 mg total) by mouth as needed. 90 tablet 0  . SYNTHROID 137 MCG tablet Take 137 mcg by mouth daily before breakfast.    . tiZANidine (ZANAFLEX) 4 MG capsule Take 4 mg by mouth 3 (three) times daily as needed for muscle spasms.     No current facility-administered medications on file prior to visit.     BP 114/70 (BP Location: Left Arm, Patient Position: Sitting, Cuff Size: Large)   Pulse 84   Temp 98.8 F (37.1 C) (Oral)   Resp 16   Ht 5\' 5"  (1.651 m)   Wt 202 lb 12.8 oz (92 kg)   SpO2 100%   BMI 33.75 kg/m       Objective:   Physical Exam  General Appearance- Not in acute distress.   Neck- full range of motion. No lymphadenopathy.  Mouth- on inspection of mouth. Teeth extraction sites look like healing well.  Chest and Lung Exam Auscultation: Breath sounds:-Normal. Adventitious sounds:- No Adventitious sounds.  Cardiovascular Auscultation:Rythm - Regular. Heart Sounds -Normal heart sounds.  Abdomen Inspection:-Inspection Normal.  Palpation/Perucssion: Palpation and Percussion of the abdomen reveal- Non Tender, No Rebound tenderness, No rigidity(Guarding) and No Palpable abdominal masses.  Liver:-Normal.  Spleen:- Normal.   Back- no cva tenderness.      Assessment & Plan:  You appear to have a urinary tract infection. I am prescribing cipro antibiotic for the probable infection. Hydrate well. I  am sending out a urine culture. During the interim if your signs and symptoms worsen rather than improving please notify us. We will notify your when the culture results are back.  Making diflucan available for yeast infection if it occurs.   Follow up in 7 days or as needed.

## 2016-11-07 NOTE — Patient Instructions (Addendum)
You appear to have a urinary tract infection. I am prescribing cipro antibiotic for the probable infection. Hydrate well. I am sending out a urine culture. During the interim if your signs and symptoms worsen rather than improving please notify us. We will notify your when the culture results are back.  Making diflucan available for yeast infection if it occurs.  Continue probiotic while on anitbiotic.  Follow up in 7 days or as needed.  Advised in 10-14 days will repeat urine to check for any residual blood.

## 2016-11-08 LAB — URINE CULTURE: ORGANISM ID, BACTERIA: NO GROWTH

## 2016-11-20 ENCOUNTER — Other Ambulatory Visit (INDEPENDENT_AMBULATORY_CARE_PROVIDER_SITE_OTHER): Payer: Medicare Other

## 2016-11-20 DIAGNOSIS — R319 Hematuria, unspecified: Secondary | ICD-10-CM | POA: Diagnosis not present

## 2016-11-20 LAB — POC URINALSYSI DIPSTICK (AUTOMATED)
Bilirubin, UA: NEGATIVE
Blood, UA: NEGATIVE
GLUCOSE UA: NEGATIVE
Ketones, UA: NEGATIVE
LEUKOCYTES UA: NEGATIVE
Nitrite, UA: NEGATIVE
Protein, UA: NEGATIVE
Spec Grav, UA: 1.01 (ref 1.010–1.025)
UROBILINOGEN UA: 0.2 U/dL
pH, UA: 6 (ref 5.0–8.0)

## 2016-11-21 ENCOUNTER — Ambulatory Visit (INDEPENDENT_AMBULATORY_CARE_PROVIDER_SITE_OTHER): Payer: 59 | Admitting: Psychology

## 2016-11-21 DIAGNOSIS — F331 Major depressive disorder, recurrent, moderate: Secondary | ICD-10-CM | POA: Diagnosis not present

## 2016-11-22 DIAGNOSIS — Z79891 Long term (current) use of opiate analgesic: Secondary | ICD-10-CM | POA: Diagnosis not present

## 2016-11-22 DIAGNOSIS — M62838 Other muscle spasm: Secondary | ICD-10-CM | POA: Diagnosis not present

## 2016-11-22 DIAGNOSIS — G894 Chronic pain syndrome: Secondary | ICD-10-CM | POA: Diagnosis not present

## 2016-11-22 DIAGNOSIS — M4726 Other spondylosis with radiculopathy, lumbar region: Secondary | ICD-10-CM | POA: Diagnosis not present

## 2016-12-20 DIAGNOSIS — Z79891 Long term (current) use of opiate analgesic: Secondary | ICD-10-CM | POA: Diagnosis not present

## 2016-12-20 DIAGNOSIS — G894 Chronic pain syndrome: Secondary | ICD-10-CM | POA: Diagnosis not present

## 2016-12-20 DIAGNOSIS — M62838 Other muscle spasm: Secondary | ICD-10-CM | POA: Diagnosis not present

## 2016-12-20 DIAGNOSIS — M4726 Other spondylosis with radiculopathy, lumbar region: Secondary | ICD-10-CM | POA: Diagnosis not present

## 2017-01-02 ENCOUNTER — Ambulatory Visit (INDEPENDENT_AMBULATORY_CARE_PROVIDER_SITE_OTHER): Payer: 59 | Admitting: Psychology

## 2017-01-02 DIAGNOSIS — F331 Major depressive disorder, recurrent, moderate: Secondary | ICD-10-CM | POA: Diagnosis not present

## 2017-01-17 DIAGNOSIS — Z79891 Long term (current) use of opiate analgesic: Secondary | ICD-10-CM | POA: Diagnosis not present

## 2017-01-17 DIAGNOSIS — M62838 Other muscle spasm: Secondary | ICD-10-CM | POA: Diagnosis not present

## 2017-01-17 DIAGNOSIS — G894 Chronic pain syndrome: Secondary | ICD-10-CM | POA: Diagnosis not present

## 2017-01-17 DIAGNOSIS — M4726 Other spondylosis with radiculopathy, lumbar region: Secondary | ICD-10-CM | POA: Diagnosis not present

## 2017-01-28 DIAGNOSIS — E559 Vitamin D deficiency, unspecified: Secondary | ICD-10-CM | POA: Diagnosis not present

## 2017-01-28 DIAGNOSIS — I10 Essential (primary) hypertension: Secondary | ICD-10-CM | POA: Diagnosis not present

## 2017-01-28 DIAGNOSIS — R7301 Impaired fasting glucose: Secondary | ICD-10-CM | POA: Diagnosis not present

## 2017-01-28 DIAGNOSIS — E039 Hypothyroidism, unspecified: Secondary | ICD-10-CM | POA: Diagnosis not present

## 2017-01-31 ENCOUNTER — Other Ambulatory Visit: Payer: Self-pay

## 2017-01-31 MED ORDER — ISOMETHEPTENE-DICHLORAL-APAP 65-100-325 MG PO CAPS: 2.0000 | ORAL_CAPSULE | Freq: Four times a day (QID) | ORAL | 1 refills | Status: DC | PRN

## 2017-01-31 NOTE — Telephone Encounter (Signed)
Faxed in Rx to pharm with req for pharm to advise pt to call and schedule appt/thx dmf

## 2017-02-19 ENCOUNTER — Other Ambulatory Visit: Payer: Self-pay | Admitting: Obstetrics and Gynecology

## 2017-02-19 DIAGNOSIS — Z1231 Encounter for screening mammogram for malignant neoplasm of breast: Secondary | ICD-10-CM

## 2017-02-19 DIAGNOSIS — Z01419 Encounter for gynecological examination (general) (routine) without abnormal findings: Secondary | ICD-10-CM | POA: Diagnosis not present

## 2017-02-21 ENCOUNTER — Ambulatory Visit (INDEPENDENT_AMBULATORY_CARE_PROVIDER_SITE_OTHER): Payer: Medicare Other | Admitting: Psychology

## 2017-02-21 DIAGNOSIS — F331 Major depressive disorder, recurrent, moderate: Secondary | ICD-10-CM

## 2017-02-26 DIAGNOSIS — M4726 Other spondylosis with radiculopathy, lumbar region: Secondary | ICD-10-CM | POA: Diagnosis not present

## 2017-02-26 DIAGNOSIS — Z79891 Long term (current) use of opiate analgesic: Secondary | ICD-10-CM | POA: Diagnosis not present

## 2017-02-26 DIAGNOSIS — M62838 Other muscle spasm: Secondary | ICD-10-CM | POA: Diagnosis not present

## 2017-02-26 DIAGNOSIS — M47814 Spondylosis without myelopathy or radiculopathy, thoracic region: Secondary | ICD-10-CM | POA: Diagnosis not present

## 2017-02-26 DIAGNOSIS — G894 Chronic pain syndrome: Secondary | ICD-10-CM | POA: Diagnosis not present

## 2017-02-27 ENCOUNTER — Ambulatory Visit: Payer: Self-pay

## 2017-03-06 ENCOUNTER — Ambulatory Visit
Admission: RE | Admit: 2017-03-06 | Discharge: 2017-03-06 | Disposition: A | Discharge status: Home / Self Care | Payer: Medicare Other | Source: Ambulatory Visit | Attending: Obstetrics and Gynecology | Admitting: Obstetrics and Gynecology

## 2017-03-06 DIAGNOSIS — Z1231 Encounter for screening mammogram for malignant neoplasm of breast: Secondary | ICD-10-CM

## 2017-03-27 DIAGNOSIS — Z79891 Long term (current) use of opiate analgesic: Secondary | ICD-10-CM | POA: Diagnosis not present

## 2017-03-27 DIAGNOSIS — M62838 Other muscle spasm: Secondary | ICD-10-CM | POA: Diagnosis not present

## 2017-03-27 DIAGNOSIS — M4726 Other spondylosis with radiculopathy, lumbar region: Secondary | ICD-10-CM | POA: Diagnosis not present

## 2017-03-27 DIAGNOSIS — E039 Hypothyroidism, unspecified: Secondary | ICD-10-CM | POA: Diagnosis not present

## 2017-03-27 DIAGNOSIS — G894 Chronic pain syndrome: Secondary | ICD-10-CM | POA: Diagnosis not present

## 2017-04-04 ENCOUNTER — Ambulatory Visit (INDEPENDENT_AMBULATORY_CARE_PROVIDER_SITE_OTHER): Payer: Medicare Other | Admitting: Psychology

## 2017-04-04 DIAGNOSIS — F331 Major depressive disorder, recurrent, moderate: Secondary | ICD-10-CM | POA: Diagnosis not present

## 2017-04-25 DIAGNOSIS — M62838 Other muscle spasm: Secondary | ICD-10-CM | POA: Diagnosis not present

## 2017-04-25 DIAGNOSIS — Z79891 Long term (current) use of opiate analgesic: Secondary | ICD-10-CM | POA: Diagnosis not present

## 2017-04-25 DIAGNOSIS — M4726 Other spondylosis with radiculopathy, lumbar region: Secondary | ICD-10-CM | POA: Diagnosis not present

## 2017-04-25 DIAGNOSIS — G894 Chronic pain syndrome: Secondary | ICD-10-CM | POA: Diagnosis not present

## 2017-05-16 ENCOUNTER — Ambulatory Visit: Payer: Medicare Other | Admitting: Psychology

## 2017-05-16 DIAGNOSIS — F331 Major depressive disorder, recurrent, moderate: Secondary | ICD-10-CM | POA: Diagnosis not present

## 2017-05-23 DIAGNOSIS — Z79891 Long term (current) use of opiate analgesic: Secondary | ICD-10-CM | POA: Diagnosis not present

## 2017-05-23 DIAGNOSIS — G894 Chronic pain syndrome: Secondary | ICD-10-CM | POA: Diagnosis not present

## 2017-05-23 DIAGNOSIS — M4726 Other spondylosis with radiculopathy, lumbar region: Secondary | ICD-10-CM | POA: Diagnosis not present

## 2017-05-23 DIAGNOSIS — M62838 Other muscle spasm: Secondary | ICD-10-CM | POA: Diagnosis not present

## 2017-06-20 DIAGNOSIS — M4726 Other spondylosis with radiculopathy, lumbar region: Secondary | ICD-10-CM | POA: Diagnosis not present

## 2017-06-20 DIAGNOSIS — Z79891 Long term (current) use of opiate analgesic: Secondary | ICD-10-CM | POA: Diagnosis not present

## 2017-06-20 DIAGNOSIS — G894 Chronic pain syndrome: Secondary | ICD-10-CM | POA: Diagnosis not present

## 2017-06-20 DIAGNOSIS — M62838 Other muscle spasm: Secondary | ICD-10-CM | POA: Diagnosis not present

## 2017-07-03 ENCOUNTER — Ambulatory Visit: Payer: Self-pay | Admitting: *Deleted

## 2017-07-03 ENCOUNTER — Ambulatory Visit: Payer: Medicare Other | Admitting: Psychology

## 2017-07-03 DIAGNOSIS — F331 Major depressive disorder, recurrent, moderate: Secondary | ICD-10-CM | POA: Diagnosis not present

## 2017-07-04 ENCOUNTER — Encounter: Payer: Self-pay | Admitting: *Deleted

## 2017-07-04 ENCOUNTER — Ambulatory Visit (INDEPENDENT_AMBULATORY_CARE_PROVIDER_SITE_OTHER): Payer: Medicare Other | Admitting: Family Medicine

## 2017-07-04 ENCOUNTER — Encounter: Payer: Self-pay | Admitting: Family Medicine

## 2017-07-04 ENCOUNTER — Ambulatory Visit (INDEPENDENT_AMBULATORY_CARE_PROVIDER_SITE_OTHER): Payer: Medicare Other | Admitting: *Deleted

## 2017-07-04 VITALS — BP 122/86 | HR 98 | Wt 208.0 lb

## 2017-07-04 VITALS — BP 122/86 | HR 95 | Temp 98.7°F | Resp 16 | Ht 66.0 in | Wt 208.6 lb

## 2017-07-04 DIAGNOSIS — R110 Nausea: Secondary | ICD-10-CM | POA: Diagnosis not present

## 2017-07-04 DIAGNOSIS — Z8669 Personal history of other diseases of the nervous system and sense organs: Secondary | ICD-10-CM | POA: Diagnosis not present

## 2017-07-04 DIAGNOSIS — Z0000 Encounter for general adult medical examination without abnormal findings: Secondary | ICD-10-CM | POA: Diagnosis not present

## 2017-07-04 MED ORDER — ISOMETHEPTENE-DICHLORAL-APAP 65-100-325 MG PO CAPS: 2.0000 | ORAL_CAPSULE | Freq: Four times a day (QID) | ORAL | 1 refills | Status: DC | PRN

## 2017-07-04 MED ORDER — PROMETHAZINE HCL 25 MG PO TABS: 25.0000 mg | ORAL_TABLET | ORAL | 0 refills | Status: DC | PRN

## 2017-07-04 NOTE — Patient Instructions (Signed)
Continue to eat heart healthy diet (full of fruits, vegetables, whole grains, lean protein, water--limit salt, fat, and sugar intake) and increase physical activity as tolerated.  Bring a copy of your living will and/or healthcare power of attorney to your next office visit.  Continue doing brain stimulating activities (puzzles, reading, adult coloring books, staying active) to keep memory sharp.    Karen Brown , Thank you for taking time to come for your Medicare Wellness Visit. I appreciate your ongoing commitment to your health goals. Please review the following plan we discussed and let me know if I can assist you in the future.   These are the goals we discussed: Goals    . Socialize more     Go to pool and go to church.        This is a list of the screening recommended for you and due dates:  Health Maintenance  Topic Date Due  . Tetanus Vaccine  01/22/2017  .  Hepatitis C: One time screening is recommended by Center for Disease Control  (CDC) for  adults born from [birth year] through 1967.   07/05/2023*  . HIV Screening  07/05/2023*  . Mammogram  03/06/2018  . Colon Cancer Screening  12/10/2019  . Flu Shot  Completed  *Topic was postponed. The date shown is not the original due date.    Health Maintenance for Postmenopausal Women Menopause is a normal process in which your reproductive ability comes to an end. This process happens gradually over a span of months to years, usually between the ages of 50 and 58. Menopause is complete when you have missed 12 consecutive menstrual periods. It is important to talk with your health care provider about some of the most common conditions that affect postmenopausal women, such as heart disease, cancer, and bone loss (osteoporosis). Adopting a healthy lifestyle and getting preventive care can help to promote your health and wellness. Those actions can also lower your chances of developing some of these common conditions. What should I know about  menopause? During menopause, you may experience a number of symptoms, such as:  Moderate-to-severe hot flashes.  Night sweats.  Decrease in sex drive.  Mood swings.  Headaches.  Tiredness.  Irritability.  Memory problems.  Insomnia.  Choosing to treat or not to treat menopausal changes is an individual decision that you make with your health care provider. What should I know about hormone replacement therapy and supplements? Hormone therapy products are effective for treating symptoms that are associated with menopause, such as hot flashes and night sweats. Hormone replacement carries certain risks, especially as you become older. If you are thinking about using estrogen or estrogen with progestin treatments, discuss the benefits and risks with your health care provider. What should I know about heart disease and stroke? Heart disease, heart attack, and stroke become more likely as you age. This may be due, in part, to the hormonal changes that your body experiences during menopause. These can affect how your body processes dietary fats, triglycerides, and cholesterol. Heart attack and stroke are both medical emergencies. There are many things that you can do to help prevent heart disease and stroke:  Have your blood pressure checked at least every 1-2 years. High blood pressure causes heart disease and increases the risk of stroke.  If you are 33-4 years old, ask your health care provider if you should take aspirin to prevent a heart attack or a stroke.  Do not use any tobacco products, including cigarettes,  chewing tobacco, or electronic cigarettes. If you need help quitting, ask your health care provider.  It is important to eat a healthy diet and maintain a healthy weight. ? Be sure to include plenty of vegetables, fruits, low-fat dairy products, and lean protein. ? Avoid eating foods that are high in solid fats, added sugars, or salt (sodium).  Get regular exercise. This  is one of the most important things that you can do for your health. ? Try to exercise for at least 150 minutes each week. The type of exercise that you do should increase your heart rate and make you sweat. This is known as moderate-intensity exercise. ? Try to do strengthening exercises at least twice each week. Do these in addition to the moderate-intensity exercise.  Know your numbers.Ask your health care provider to check your cholesterol and your blood glucose. Continue to have your blood tested as directed by your health care provider.  What should I know about cancer screening? There are several types of cancer. Take the following steps to reduce your risk and to catch any cancer development as early as possible. Breast Cancer  Practice breast self-awareness. ? This means understanding how your breasts normally appear and feel. ? It also means doing regular breast self-exams. Let your health care provider know about any changes, no matter how small.  If you are 105 or older, have a clinician do a breast exam (clinical breast exam or CBE) every year. Depending on your age, family history, and medical history, it may be recommended that you also have a yearly breast X-ray (mammogram).  If you have a family history of breast cancer, talk with your health care provider about genetic screening.  If you are at high risk for breast cancer, talk with your health care provider about having an MRI and a mammogram every year.  Breast cancer (BRCA) gene test is recommended for women who have family members with BRCA-related cancers. Results of the assessment will determine the need for genetic counseling and BRCA1 and for BRCA2 testing. BRCA-related cancers include these types: ? Breast. This occurs in males or females. ? Ovarian. ? Tubal. This may also be called fallopian tube cancer. ? Cancer of the abdominal or pelvic lining (peritoneal cancer). ? Prostate. ? Pancreatic.  Cervical,  Uterine, and Ovarian Cancer Your health care provider may recommend that you be screened regularly for cancer of the pelvic organs. These include your ovaries, uterus, and vagina. This screening involves a pelvic exam, which includes checking for microscopic changes to the surface of your cervix (Pap test).  For women ages 21-65, health care providers may recommend a pelvic exam and a Pap test every three years. For women ages 65-65, they may recommend the Pap test and pelvic exam, combined with testing for human papilloma virus (HPV), every five years. Some types of HPV increase your risk of cervical cancer. Testing for HPV may also be done on women of any age who have unclear Pap test results.  Other health care providers may not recommend any screening for nonpregnant women who are considered low risk for pelvic cancer and have no symptoms. Ask your health care provider if a screening pelvic exam is right for you.  If you have had past treatment for cervical cancer or a condition that could lead to cancer, you need Pap tests and screening for cancer for at least 20 years after your treatment. If Pap tests have been discontinued for you, your risk factors (such as having  a new sexual partner) need to be reassessed to determine if you should start having screenings again. Some women have medical problems that increase the chance of getting cervical cancer. In these cases, your health care provider may recommend that you have screening and Pap tests more often.  If you have a family history of uterine cancer or ovarian cancer, talk with your health care provider about genetic screening.  If you have vaginal bleeding after reaching menopause, tell your health care provider.  There are currently no reliable tests available to screen for ovarian cancer.  Lung Cancer Lung cancer screening is recommended for adults 68-58 years old who are at high risk for lung cancer because of a history of smoking. A  yearly low-dose CT scan of the lungs is recommended if you:  Currently smoke.  Have a history of at least 30 pack-years of smoking and you currently smoke or have quit within the past 15 years. A pack-year is smoking an average of one pack of cigarettes per day for one year.  Yearly screening should:  Continue until it has been 15 years since you quit.  Stop if you develop a health problem that would prevent you from having lung cancer treatment.  Colorectal Cancer  This type of cancer can be detected and can often be prevented.  Routine colorectal cancer screening usually begins at age 66 and continues through age 44.  If you have risk factors for colon cancer, your health care provider may recommend that you be screened at an earlier age.  If you have a family history of colorectal cancer, talk with your health care provider about genetic screening.  Your health care provider may also recommend using home test kits to check for hidden blood in your stool.  A small camera at the end of a tube can be used to examine your colon directly (sigmoidoscopy or colonoscopy). This is done to check for the earliest forms of colorectal cancer.  Direct examination of the colon should be repeated every 5-10 years until age 53. However, if early forms of precancerous polyps or small growths are found or if you have a family history or genetic risk for colorectal cancer, you may need to be screened more often.  Skin Cancer  Check your skin from head to toe regularly.  Monitor any moles. Be sure to tell your health care provider: ? About any new moles or changes in moles, especially if there is a change in a mole's shape or color. ? If you have a mole that is larger than the size of a pencil eraser.  If any of your family members has a history of skin cancer, especially at a young age, talk with your health care provider about genetic screening.  Always use sunscreen. Apply sunscreen liberally  and repeatedly throughout the day.  Whenever you are outside, protect yourself by wearing long sleeves, pants, a wide-brimmed hat, and sunglasses.  What should I know about osteoporosis? Osteoporosis is a condition in which bone destruction happens more quickly than new bone creation. After menopause, you may be at an increased risk for osteoporosis. To help prevent osteoporosis or the bone fractures that can happen because of osteoporosis, the following is recommended:  If you are 76-18 years old, get at least 1,000 mg of calcium and at least 600 mg of vitamin D per day.  If you are older than age 31 but younger than age 21, get at least 1,200 mg of calcium and at  least 600 mg of vitamin D per day.  If you are older than age 9, get at least 1,200 mg of calcium and at least 800 mg of vitamin D per day.  Smoking and excessive alcohol intake increase the risk of osteoporosis. Eat foods that are rich in calcium and vitamin D, and do weight-bearing exercises several times each week as directed by your health care provider. What should I know about how menopause affects my mental health? Depression may occur at any age, but it is more common as you become older. Common symptoms of depression include:  Low or sad mood.  Changes in sleep patterns.  Changes in appetite or eating patterns.  Feeling an overall lack of motivation or enjoyment of activities that you previously enjoyed.  Frequent crying spells.  Talk with your health care provider if you think that you are experiencing depression. What should I know about immunizations? It is important that you get and maintain your immunizations. These include:  Tetanus, diphtheria, and pertussis (Tdap) booster vaccine.  Influenza every year before the flu season begins.  Pneumonia vaccine.  Shingles vaccine.  Your health care provider may also recommend other immunizations. This information is not intended to replace advice given to you  by your health care provider. Make sure you discuss any questions you have with your health care provider. Document Released: 07/20/2005 Document Revised: 12/16/2015 Document Reviewed: 03/01/2015 Elsevier Interactive Patient Education  2018 [employer].

## 2017-07-04 NOTE — Patient Instructions (Signed)
Preventive Care 40-64 Years, Female Preventive care refers to lifestyle choices and visits with your health care provider that can promote health and wellness. What does preventive care include?  A yearly physical exam. This is also called an annual well check.  Dental exams once or twice a year.  Routine eye exams. Ask your health care provider how often you should have your eyes checked.  Personal lifestyle choices, including: ? Daily care of your teeth and gums. ? Regular physical activity. ? Eating a healthy diet. ? Avoiding tobacco and drug use. ? Limiting alcohol use. ? Practicing safe sex. ? Taking low-dose aspirin daily starting at age 58. ? Taking vitamin and mineral supplements as recommended by your health care provider. What happens during an annual well check? The services and screenings done by your health care provider during your annual well check will depend on your age, overall health, lifestyle risk factors, and family history of disease. Counseling Your health care provider may ask you questions about your:  Alcohol use.  Tobacco use.  Drug use.  Emotional well-being.  Home and relationship well-being.  Sexual activity.  Eating habits.  Work and work Statistician.  Method of birth control.  Menstrual cycle.  Pregnancy history.  Screening You may have the following tests or measurements:  Height, weight, and BMI.  Blood pressure.  Lipid and cholesterol levels. These may be checked every 5 years, or more frequently if you are over 81 years old.  Skin check.  Lung cancer screening. You may have this screening every year starting at age 78 if you have a 30-pack-year history of smoking and currently smoke or have quit within the past 15 years.  Fecal occult blood test (FOBT) of the stool. You may have this test every year starting at age 65.  Flexible sigmoidoscopy or colonoscopy. You may have a sigmoidoscopy every 5 years or a colonoscopy  every 10 years starting at age 30.  Hepatitis C blood test.  Hepatitis B blood test.  Sexually transmitted disease (STD) testing.  Diabetes screening. This is done by checking your blood sugar (glucose) after you have not eaten for a while (fasting). You may have this done every 1-3 years.  Mammogram. This may be done every 1-2 years. Talk to your health care provider about when you should start having regular mammograms. This may depend on whether you have a family history of breast cancer.  BRCA-related cancer screening. This may be done if you have a family history of breast, ovarian, tubal, or peritoneal cancers.  Pelvic exam and Pap test. This may be done every 3 years starting at age 80. Starting at age 36, this may be done every 5 years if you have a Pap test in combination with an HPV test.  Bone density scan. This is done to screen for osteoporosis. You may have this scan if you are at high risk for osteoporosis.  Discuss your test results, treatment options, and if necessary, the need for more tests with your health care provider. Vaccines Your health care provider may recommend certain vaccines, such as:  Influenza vaccine. This is recommended every year.  Tetanus, diphtheria, and acellular pertussis (Tdap, Td) vaccine. You may need a Td booster every 10 years.  Varicella vaccine. You may need this if you have not been vaccinated.  Zoster vaccine. You may need this after age 5.  Measles, mumps, and rubella (MMR) vaccine. You may need at least one dose of MMR if you were born in  1959 or later. You may also need a second dose.  Pneumococcal 13-valent conjugate (PCV13) vaccine. You may need this if you have certain conditions and were not previously vaccinated.  Pneumococcal polysaccharide (PPSV23) vaccine. You may need one or two doses if you smoke cigarettes or if you have certain conditions.  Meningococcal vaccine. You may need this if you have certain  conditions.  Hepatitis A vaccine. You may need this if you have certain conditions or if you travel or work in places where you may be exposed to hepatitis A.  Hepatitis B vaccine. You may need this if you have certain conditions or if you travel or work in places where you may be exposed to hepatitis B.  Haemophilus influenzae type b (Hib) vaccine. You may need this if you have certain conditions.  Talk to your health care provider about which screenings and vaccines you need and how often you need them. This information is not intended to replace advice given to you by your health care provider. Make sure you discuss any questions you have with your health care provider. Document Released: 06/24/2015 Document Revised: 02/15/2016 Document Reviewed: 03/29/2015 Elsevier Interactive Patient Education  2018 [employer]  

## 2017-07-04 NOTE — Progress Notes (Signed)
Subjective:   Karen Brown is a 57 y.o. female who presents for Medicare Annual (Subsequent) preventive examination. The Patient was informed that the wellness visit is to identify future health risk and educate and initiate measures that can reduce risk for increased disease through the lifespan.    Review of Systems:  No ROS.  Medicare Wellness Visit. Additional risk factors are reflected in the social history.    Sleep patterns: Sleep varies. Goes to sleep about 2am because husband works second shift. Wakes at noon. Naps as needed.  Home Safety/Smoke Alarms: Feels safe in home. Smoke alarms in place.   Female:   Pap- pt states no longer getting screened       Mammo- last 03/07/17:  BI-RADS CATEGORY  1: Negative.    Dexa scan- last 07/12/16: osteopenia       CCS- last reported 12/09/09 with 10 yr recall Objective:     Vitals: BP 122/86 Comment: per CMA @1343 .  Pulse 98   Wt 208 lb (94.3 kg)   SpO2 95%   BMI 33.57 kg/m   Body mass index is 33.57 kg/m.  Advanced Directives 07/04/2017 07/02/2016 06/28/2015  Does Patient Have a Medical Advance Directive? No No Yes  Type of Advance Directive - Public librarian;Living will  Does patient want to make changes to medical advance directive? - - No - Patient declined  Copy of [place] in Chart? - - No - copy requested  Would patient like information on creating a medical advance directive? Yes (MAU/Ambulatory/Procedural Areas - Information given) Yes (MAU/Ambulatory/Procedural Areas - Information given) -    Tobacco Social History   Tobacco Use  Smoking Status Never Smoker  Smokeless Tobacco Never Used     Counseling given: Not Answered   Clinical Intake: Pain : (pt states she has chronic pain and follows with Dr.Mark Hardin Negus)     Past Medical History:  Diagnosis Date  . Anxiety   . Bipolar 1 disorder ([place])   . Chronic migraine   . Chronically dry eyes   . Colitis, ischemic ([place])   .  Depression   . Diverticulosis   . Dry mouth   . Fibromyalgia   . GERD (gastroesophageal reflux disease)   . Hemorrhoids   . Hyperlipemia   . Hypertension   . Hypothyroidism   . IBS (irritable bowel syndrome)   . PVC (premature ventricular contraction)    Past Surgical History:  Procedure Laterality Date  . ABDOMINAL HYSTERECTOMY     Partial   . APPENDECTOMY    . BACK SURGERY     Micro diskectomy  . BACK SURGERY     L facet rhzotomy  . CHOLECYSTECTOMY    . TONSILLECTOMY     Family History  Problem Relation Age of Onset  . Kidney disease Father   . Coronary artery disease Father 66  . COPD Father 65       copd  . Heart disease Father   . Colon polyps Mother   . Osteoporosis Mother   . Colon cancer Neg Hx    Social History   Socioeconomic History  . Marital status: Married    Spouse name: None  . Number of children: 0  . Years of education: None  . Highest education level: None  Social Needs  . Financial resource strain: None  . Food insecurity - worry: None  . Food insecurity - inability: None  . Transportation needs - medical: None  .  Transportation needs - non-medical: None  Occupational History  . Occupation: Disabled     Employer: [employer]  Tobacco Use  . Smoking status: Never Smoker  . Smokeless tobacco: Never Used  Substance and Sexual Activity  . Alcohol use: No  . Drug use: No  . Sexual activity: Yes  Other Topics Concern  . None  Social History Narrative   Caffeine occ     Outpatient Encounter Medications as of 07/04/2017  Medication Sig  . Cholecalciferol (VITAMIN D-3) 5000 UNITS TABS Take 2 capsules by mouth daily.   . clidinium-chlordiazePOXIDE (LIBRAX) 5-2.5 MG per capsule TAKE ONE CAPSULE BY MOUTH 3 TIMES A DAY BEFORE MEALS AS NEEDED  . clonazePAM (KLONOPIN) 1 MG tablet Take 1 mg by mouth 3 (three) times daily as needed.    Marland Kitchen estradiol (VIVELLE-DOT) 0.075 MG/24HR Place 1 patch onto the skin once a week.   . fentaNYL (DURAGESIC -  DOSED MCG/HR) 75 MCG/HR Place 1 patch onto the skin every other day.    Marland Kitchen HYDROcodone-acetaminophen (NORCO) 10-325 MG per tablet Take by mouth daily. Will take up five a day  . isometheptene-acetaminophen-dichloralphenazone (MIDRIN) 65-100-325 MG capsule Take 2 capsules by mouth 4 (four) times daily as needed for migraine. Maximum 5 capsules in 12 hours for migraines  . lamoTRIgine (LAMICTAL) 25 MG tablet Take 25 mg by mouth 2 (two) times daily. 1 in the morning and 1 in the evening  . lisinopril (PRINIVIL,ZESTRIL) 20 MG tablet Take 1 tablet by mouth daily.  Marland Kitchen PRISTIQ 100 MG 24 hr tablet Take 1 tablet by mouth daily.   . promethazine (PHENERGAN) 25 MG tablet Take 1 tablet (25 mg total) by mouth as needed.  Marland Kitchen SYNTHROID 137 MCG tablet Take 137 mcg by mouth daily before breakfast.  . tiZANidine (ZANAFLEX) 4 MG capsule Take 4 mg by mouth 3 (three) times daily as needed for muscle spasms.  . [DISCONTINUED] ciprofloxacin (CIPRO) 500 MG tablet Take 1 tablet (500 mg total) by mouth 2 (two) times daily.  . [DISCONTINUED] isometheptene-acetaminophen-dichloralphenazone (MIDRIN) 65-100-325 MG capsule Take 2 capsules by mouth 4 (four) times daily as needed for migraine. Maximum 5 capsules in 12 hours for migraine headaches, 8 capsules in 24 hours for tension headaches.  . [DISCONTINUED] Loperamide-Simethicone (IMODIUM ADVANCED PO) Take by mouth as needed.    . [DISCONTINUED] promethazine (PHENERGAN) 25 MG tablet Take 1 tablet (25 mg total) by mouth as needed.   No facility-administered encounter medications on file as of 07/04/2017.     Activities of Daily Living In your present state of health, do you have any difficulty performing the following activities: 07/04/2017  Hearing? N  Vision? N  Comment [facility]  eye exam yearly. Wears glasses for driving.  Difficulty concentrating or making decisions? N  Walking or climbing stairs? N  Dressing or bathing? N  Doing errands, shopping? N  Preparing  Food and eating ? N  Using the Toilet? N  In the past six months, have you accidently leaked urine? N  Do you have problems with loss of bowel control? N  Managing your Medications? N  Managing your Finances? N  Housekeeping or managing your Housekeeping? N  Some recent data might be hidden    Patient Care Team: Carollee Herter, Alferd Apa, DO as PCP - General Nicholaus Bloom, MD as Consulting Physician (Anesthesiology) Jacelyn Pi, MD as Consulting Physician (Endocrinology) Phylliss Bob, MD as Consulting Physician (Orthopedic Surgery)    Assessment:   This is a routine wellness  examination for Naava. Physical assessment deferred to PCP.  Exercise Activities and Dietary recommendations Current Exercise Habits: The patient does not participate in regular exercise at present Diet (meal preparation, eat out, water intake, caffeinated beverages, dairy products, fruits and vegetables): in general, an "unhealthy" diet      Goals    . Socialize more     Go to pool and go to church.        Fall Risk Fall Risk  07/04/2017 07/02/2016 06/28/2015  Falls in the past year? Yes No No  Number falls in past yr: 1 - -  Follow up Education provided;Falls prevention discussed - -    Depression Screen PHQ 2/9 Scores 07/04/2017 07/02/2016 06/28/2015  PHQ - 2 Score 1 0 0     Cognitive Function Ad8 score reviewed for issues:  Issues making decisions:no  Less interest in hobbies / activities:no  Repeats questions, stories (family complaining):no  Trouble using ordinary gadgets (microwave, computer, phone):no  Forgets the month or year: no  Mismanaging finances: no  Remembering appts:no  Daily problems with thinking and/or memory:no Ad8 score is=0     MMSE - Mini Mental State Exam 07/02/2016  Orientation to time 5  Orientation to Place 5  Registration 3  Attention/ Calculation 5  Recall 3  Language- name 2 objects 2  Language- repeat 1  Language- follow 3 step command 3    Language- read & follow direction 1  Write a sentence 1  Copy design 1  Total score 30        Immunization History  Administered Date(s) Administered  . Influenza Split 04/09/2011, 04/03/2012  . Influenza Whole 03/15/2010  . Influenza,inj,Quad PF,6+ Mos 04/07/2013, 03/26/2014, 03/22/2015, 04/13/2016  . Influenza-Unspecified 04/04/2017  . Td 01/23/2007    Screening Tests Health Maintenance  Topic Date Due  . TETANUS/TDAP  01/22/2017  . Hepatitis C Screening  07/05/2023 (Originally 02-07-1961)  . HIV Screening  07/05/2023 (Originally 03/11/1974)  . MAMMOGRAM  03/06/2018  . COLONOSCOPY  12/10/2019  . INFLUENZA VACCINE  Completed       Plan:   Follow up with PCP as directed.  Continue to eat heart healthy diet (full of fruits, vegetables, whole grains, lean protein, water--limit salt, fat, and sugar intake) and increase physical activity as tolerated.  Bring a copy of your living will and/or healthcare power of attorney to your next office visit.  Continue doing brain stimulating activities (puzzles, reading, adult coloring books, staying active) to keep memory sharp.     I have personally reviewed and noted the following in the patient's chart:   . Medical and social history . Use of alcohol, tobacco or illicit drugs  . Current medications and supplements . Functional ability and status . Nutritional status . Physical activity . Advanced directives . List of other physicians . Hospitalizations, surgeries, and ER visits in previous 12 months . Vitals . Screenings to include cognitive, depression, and falls . Referrals and appointments  In addition, I have reviewed and discussed with patient certain preventive protocols, quality metrics, and best practice recommendations. A written personalized care plan for preventive services as well as general preventive health recommendations were provided to patient.     Shela Nevin, [place]  07/04/2017

## 2017-07-04 NOTE — Progress Notes (Signed)
Subjective:     Karen Brown is a 57 y.o. female and is here for a comprehensive physical exam. The patient reports no new problems.-- back pain and migraines are stable  Social History   Socioeconomic History  . Marital status: Married    Spouse name: Not on file  . Number of children: 0  . Years of education: Not on file  . Highest education level: Not on file  Social Needs  . Financial resource strain: Not on file  . Food insecurity - worry: Not on file  . Food insecurity - inability: Not on file  . Transportation needs - medical: Not on file  . Transportation needs - non-medical: Not on file  Occupational History  . Occupation: Disabled     Employer: [employer]  Tobacco Use  . Smoking status: Never Smoker  . Smokeless tobacco: Never Used  Substance and Sexual Activity  . Alcohol use: No  . Drug use: No  . Sexual activity: Yes  Other Topics Concern  . Not on file  Social History Narrative   Caffeine occ    Health Maintenance  Topic Date Due  . Samul Dada  01/22/2017  . Hepatitis C Screening  07/05/2023 (Originally Dec 28, 1960)  . HIV Screening  07/05/2023 (Originally 03/11/1974)  . MAMMOGRAM  03/06/2018  . COLONOSCOPY  12/10/2019  . INFLUENZA VACCINE  Completed    The following portions of the patient's history were reviewed and updated as appropriate:  She  has a past medical history of Anxiety, Bipolar 1 disorder ([place]), Chronic migraine, Chronically dry eyes, Colitis, ischemic ([facility]), Depression, Diverticulosis, Dry mouth, Fibromyalgia, GERD (gastroesophageal reflux disease), Hemorrhoids, Hyperlipemia, Hypertension, Hypothyroidism, IBS (irritable bowel syndrome), and PVC (premature ventricular contraction). She does not have any pertinent problems on file. She  has a past surgical history that includes Cholecystectomy; Abdominal hysterectomy; Tonsillectomy; Back surgery; Back surgery; and Appendectomy. Her family history includes COPD (age of onset: 28) in her  father; Colon polyps in her mother; Coronary artery disease (age of onset: 64) in her father; Heart disease in her father; Kidney disease in her father; Osteoporosis in her mother. She  reports that  has never smoked. she has never used smokeless tobacco. She reports that she does not drink alcohol or use drugs. She has a current medication list which includes the following prescription(s): vitamin d-3, clidinium-chlordiazepoxide, clonazepam, estradiol, fentanyl, hydrocodone-acetaminophen, isometheptene-acetaminophen-dichloralphenazone, lamotrigine, lisinopril, OVER THE COUNTER MEDICATION, pristiq, promethazine, synthroid, and tizanidine. Current Outpatient Medications on File Prior to Visit  Medication Sig Dispense Refill  . Cholecalciferol (VITAMIN D-3) 5000 UNITS TABS Take 2 capsules by mouth daily.     . clidinium-chlordiazePOXIDE (LIBRAX) 5-2.5 MG per capsule TAKE ONE CAPSULE BY MOUTH 3 TIMES A DAY BEFORE MEALS AS NEEDED 90 capsule 2  . clonazePAM (KLONOPIN) 1 MG tablet Take 1 mg by mouth 3 (three) times daily as needed.      Marland Kitchen estradiol (VIVELLE-DOT) 0.075 MG/24HR Place 1 patch onto the skin once a week.     . fentaNYL (DURAGESIC - DOSED MCG/HR) 75 MCG/HR Place 1 patch onto the skin every other day.      Marland Kitchen HYDROcodone-acetaminophen (NORCO) 10-325 MG per tablet Take by mouth daily. Will take up five a day    . lamoTRIgine (LAMICTAL) 25 MG tablet Take 25 mg by mouth 2 (two) times daily. 1 in the morning and 1 in the evening    . lisinopril (PRINIVIL,ZESTRIL) 20 MG tablet Take 1 tablet by mouth daily.    Marland Kitchen OVER  THE COUNTER MEDICATION Take 3 tablets by mouth daily. cartilast    . PRISTIQ 100 MG 24 hr tablet Take 1 tablet by mouth daily.     Marland Kitchen SYNTHROID 137 MCG tablet Take 137 mcg by mouth daily before breakfast.    . tiZANidine (ZANAFLEX) 4 MG capsule Take 4 mg by mouth 3 (three) times daily as needed for muscle spasms.     No current facility-administered medications on file prior to visit.     She is allergic to biaxin [clarithromycin]; clindamycin/lincomycin; ketorolac tromethamine; pb-hyoscy-atropine-scopolamine; penicillins; phenobarbital; sulfonamide derivatives; topiramate; and zaleplon..  Review of Systems Review of Systems  Constitutional: Negative for activity change, appetite change and fatigue.  HENT: Negative for hearing loss, congestion, tinnitus and ear discharge.  dentist q47m Eyes: Negative for visual disturbance (see optho q1y -- vision corrected to 20/20 with glasses).  Respiratory: Negative for cough, chest tightness and shortness of breath.   Cardiovascular: Negative for chest pain, palpitations and leg swelling.  Gastrointestinal: Negative for abdominal pain, diarrhea, constipation and abdominal distention.  Genitourinary: Negative for urgency, frequency, decreased urine volume and difficulty urinating.  Musculoskeletal: Negative for back pain, arthralgias and gait problem.  Skin: Negative for color change, pallor and rash.  Neurological: Negative for dizziness, light-headedness, numbness and headaches.  Hematological: Negative for adenopathy. Does not bruise/bleed easily.  Psychiatric/Behavioral: Negative for suicidal ideas, confusion, sleep disturbance, self-injury, dysphoric mood, decreased concentration and agitation.      Objective:    BP 122/86 (BP Location: Right Arm, Cuff Size: Large)   Pulse 95   Temp 98.7 F (37.1 C) (Oral)   Resp 16   Ht 5\' 6"  (1.676 m)   Wt 208 lb 9.6 oz (94.6 kg)   SpO2 95%   BMI 33.67 kg/m  General appearance: alert, cooperative, appears stated age and no distress Head: Normocephalic, without obvious abnormality, atraumatic Eyes: negative findings: lids and lashes normal and pupils equal, round, reactive to light and accomodation Ears: normal TM's and external ear canals both ears Nose: Nares normal. Septum midline. Mucosa normal. No drainage or sinus tenderness.  Throat: lips, mucosa, and tongue normal; teeth and  gums normal Neck: no adenopathy, no carotid bruit, no JVD, supple, symmetrical, trachea midline and thyroid not enlarged, symmetric, no tenderness/mass/nodules Back: symmetric, no curvature. ROM normal. No CVA tenderness. Lungs: clear to auscultation bilaterally Breasts: gyn Heart: regular rate and rhythm, S1, S2 normal, no murmur, click, rub or gallop Abdomen: soft, non-tender; bowel sounds normal; no masses,  no organomegaly Pelvic: deferred --gyn Extremities: extremities normal, atraumatic, no cyanosis or edema Pulses: 2+ and symmetric Skin: Skin color, texture, turgor normal. No rashes or lesions Lymph nodes: Cervical, supraclavicular, and axillary nodes normal. Neurologic: Alert and oriented X 3, normal strength and tone. Normal symmetric reflexes. Normal coordination and gait    Assessment:    Healthy female exam.     Plan:    ghm utd Check labs See After Visit Summary for Counseling Recommendations    1. Nausea without vomiting Not new  - promethazine (PHENERGAN) 25 MG tablet; Take 1 tablet (25 mg total) by mouth as needed.  Dispense: 90 tablet; Refill: 0  2. Hx of migraines Stable --refill  meds - isometheptene-acetaminophen-dichloralphenazone (MIDRIN) 65-100-325 MG capsule; Take 2 capsules by mouth 4 (four) times daily as needed for migraine. Maximum 5 capsules in 12 hours for migraines  Dispense: 30 capsule; Refill: 1

## 2017-07-11 ENCOUNTER — Other Ambulatory Visit: Payer: Self-pay

## 2017-07-11 DIAGNOSIS — Z8669 Personal history of other diseases of the nervous system and sense organs: Secondary | ICD-10-CM

## 2017-07-11 MED ORDER — ISOMETHEPTENE-DICHLORAL-APAP 65-100-325 MG PO CAPS: 2.0000 | ORAL_CAPSULE | Freq: Four times a day (QID) | ORAL | 1 refills | Status: DC | PRN

## 2017-07-11 NOTE — Telephone Encounter (Signed)
This medication has been discontinued by manufactured.  Please advise

## 2017-07-12 ENCOUNTER — Other Ambulatory Visit: Payer: Self-pay | Admitting: Family Medicine

## 2017-07-12 ENCOUNTER — Encounter: Payer: Self-pay | Admitting: Family Medicine

## 2017-07-12 DIAGNOSIS — G43809 Other migraine, not intractable, without status migrainosus: Secondary | ICD-10-CM

## 2017-07-12 MED ORDER — BUTALBITAL-APAP-CAFFEINE 50-325-40 MG PO TABS: 1.0000 | ORAL_TABLET | Freq: Four times a day (QID) | ORAL | 0 refills | Status: DC | PRN

## 2017-07-12 NOTE — Telephone Encounter (Signed)
Called in fiorcet

## 2017-07-18 DIAGNOSIS — M4726 Other spondylosis with radiculopathy, lumbar region: Secondary | ICD-10-CM | POA: Diagnosis not present

## 2017-07-18 DIAGNOSIS — G894 Chronic pain syndrome: Secondary | ICD-10-CM | POA: Diagnosis not present

## 2017-07-18 DIAGNOSIS — M62838 Other muscle spasm: Secondary | ICD-10-CM | POA: Diagnosis not present

## 2017-07-18 DIAGNOSIS — Z79891 Long term (current) use of opiate analgesic: Secondary | ICD-10-CM | POA: Diagnosis not present

## 2017-08-01 DIAGNOSIS — M791 Myalgia, unspecified site: Secondary | ICD-10-CM | POA: Diagnosis not present

## 2017-08-01 DIAGNOSIS — R5383 Other fatigue: Secondary | ICD-10-CM | POA: Diagnosis not present

## 2017-08-01 DIAGNOSIS — I10 Essential (primary) hypertension: Secondary | ICD-10-CM | POA: Diagnosis not present

## 2017-08-01 DIAGNOSIS — E559 Vitamin D deficiency, unspecified: Secondary | ICD-10-CM | POA: Diagnosis not present

## 2017-08-01 DIAGNOSIS — E039 Hypothyroidism, unspecified: Secondary | ICD-10-CM | POA: Diagnosis not present

## 2017-08-01 DIAGNOSIS — M792 Neuralgia and neuritis, unspecified: Secondary | ICD-10-CM | POA: Diagnosis not present

## 2017-08-01 DIAGNOSIS — R7301 Impaired fasting glucose: Secondary | ICD-10-CM | POA: Diagnosis not present

## 2017-08-14 ENCOUNTER — Ambulatory Visit: Payer: Medicare Other | Admitting: Psychology

## 2017-08-14 DIAGNOSIS — F331 Major depressive disorder, recurrent, moderate: Secondary | ICD-10-CM

## 2017-08-15 DIAGNOSIS — Z79891 Long term (current) use of opiate analgesic: Secondary | ICD-10-CM | POA: Diagnosis not present

## 2017-08-15 DIAGNOSIS — G894 Chronic pain syndrome: Secondary | ICD-10-CM | POA: Diagnosis not present

## 2017-08-15 DIAGNOSIS — M4726 Other spondylosis with radiculopathy, lumbar region: Secondary | ICD-10-CM | POA: Diagnosis not present

## 2017-08-15 DIAGNOSIS — M62838 Other muscle spasm: Secondary | ICD-10-CM | POA: Diagnosis not present

## 2017-08-20 DIAGNOSIS — D2261 Melanocytic nevi of right upper limb, including shoulder: Secondary | ICD-10-CM | POA: Diagnosis not present

## 2017-08-20 DIAGNOSIS — L858 Other specified epidermal thickening: Secondary | ICD-10-CM | POA: Diagnosis not present

## 2017-08-20 DIAGNOSIS — L720 Epidermal cyst: Secondary | ICD-10-CM | POA: Diagnosis not present

## 2017-08-20 DIAGNOSIS — D2262 Melanocytic nevi of left upper limb, including shoulder: Secondary | ICD-10-CM | POA: Diagnosis not present

## 2017-08-20 DIAGNOSIS — L821 Other seborrheic keratosis: Secondary | ICD-10-CM | POA: Diagnosis not present

## 2017-09-11 ENCOUNTER — Ambulatory Visit: Payer: Medicare Other | Admitting: Psychology

## 2017-09-12 DIAGNOSIS — G894 Chronic pain syndrome: Secondary | ICD-10-CM | POA: Diagnosis not present

## 2017-09-12 DIAGNOSIS — Z79891 Long term (current) use of opiate analgesic: Secondary | ICD-10-CM | POA: Diagnosis not present

## 2017-09-12 DIAGNOSIS — M4726 Other spondylosis with radiculopathy, lumbar region: Secondary | ICD-10-CM | POA: Diagnosis not present

## 2017-09-12 DIAGNOSIS — M62838 Other muscle spasm: Secondary | ICD-10-CM | POA: Diagnosis not present

## 2017-09-25 ENCOUNTER — Other Ambulatory Visit: Payer: Self-pay | Admitting: Family Medicine

## 2017-09-25 DIAGNOSIS — G43809 Other migraine, not intractable, without status migrainosus: Secondary | ICD-10-CM

## 2017-09-26 DIAGNOSIS — Z79891 Long term (current) use of opiate analgesic: Secondary | ICD-10-CM | POA: Diagnosis not present

## 2017-09-26 DIAGNOSIS — G894 Chronic pain syndrome: Secondary | ICD-10-CM | POA: Diagnosis not present

## 2017-09-26 DIAGNOSIS — M4726 Other spondylosis with radiculopathy, lumbar region: Secondary | ICD-10-CM | POA: Diagnosis not present

## 2017-09-26 DIAGNOSIS — M62838 Other muscle spasm: Secondary | ICD-10-CM | POA: Diagnosis not present

## 2017-09-26 NOTE — Telephone Encounter (Signed)
[facility] requesting refill on fioricet  Database ran and is on your desk for review.  Last filled per database: Med is not on database. Last written: 07/12/17 Last ov: 07/04/17 Next ov: 07/08/18 Contract: none UDS: none

## 2017-10-04 ENCOUNTER — Encounter: Payer: Self-pay | Admitting: Family Medicine

## 2017-10-04 DIAGNOSIS — Z79891 Long term (current) use of opiate analgesic: Secondary | ICD-10-CM | POA: Diagnosis not present

## 2017-10-04 DIAGNOSIS — G894 Chronic pain syndrome: Secondary | ICD-10-CM | POA: Diagnosis not present

## 2017-10-04 DIAGNOSIS — M4726 Other spondylosis with radiculopathy, lumbar region: Secondary | ICD-10-CM | POA: Diagnosis not present

## 2017-10-04 DIAGNOSIS — M62838 Other muscle spasm: Secondary | ICD-10-CM | POA: Diagnosis not present

## 2017-10-09 ENCOUNTER — Ambulatory Visit: Payer: Medicare Other | Admitting: Psychology

## 2017-10-09 DIAGNOSIS — F331 Major depressive disorder, recurrent, moderate: Secondary | ICD-10-CM | POA: Diagnosis not present

## 2017-10-10 DIAGNOSIS — G894 Chronic pain syndrome: Secondary | ICD-10-CM | POA: Diagnosis not present

## 2017-10-10 DIAGNOSIS — M4726 Other spondylosis with radiculopathy, lumbar region: Secondary | ICD-10-CM | POA: Diagnosis not present

## 2017-10-10 DIAGNOSIS — M62838 Other muscle spasm: Secondary | ICD-10-CM | POA: Diagnosis not present

## 2017-10-10 DIAGNOSIS — Z79891 Long term (current) use of opiate analgesic: Secondary | ICD-10-CM | POA: Diagnosis not present

## 2017-10-22 DIAGNOSIS — L720 Epidermal cyst: Secondary | ICD-10-CM | POA: Diagnosis not present

## 2017-10-24 ENCOUNTER — Encounter: Payer: Self-pay | Admitting: Family Medicine

## 2017-10-24 ENCOUNTER — Ambulatory Visit (INDEPENDENT_AMBULATORY_CARE_PROVIDER_SITE_OTHER): Payer: Medicare Other | Admitting: Family Medicine

## 2017-10-24 VITALS — BP 138/86 | HR 87 | Temp 98.6°F | Resp 16 | Ht 66.0 in | Wt 211.4 lb

## 2017-10-24 DIAGNOSIS — J324 Chronic pansinusitis: Secondary | ICD-10-CM

## 2017-10-24 DIAGNOSIS — J029 Acute pharyngitis, unspecified: Secondary | ICD-10-CM

## 2017-10-24 DIAGNOSIS — H66001 Acute suppurative otitis media without spontaneous rupture of ear drum, right ear: Secondary | ICD-10-CM | POA: Diagnosis not present

## 2017-10-24 MED ORDER — OFLOXACIN 0.3 % OT SOLN: 10.0000 [drp] | Freq: Every day | OTIC | 0 refills | Status: DC

## 2017-10-24 MED ORDER — FLUTICASONE PROPIONATE 50 MCG/ACT NA SUSP: 2.0000 | Freq: Every day | NASAL | 6 refills | Status: DC

## 2017-10-24 MED ORDER — AZITHROMYCIN 250 MG PO TABS: ORAL_TABLET | ORAL | 0 refills | Status: DC

## 2017-10-24 MED FILL — OFLOXACIN 0.3% EAR DROPS: 0.3 | 10 days supply | Qty: 5 | Fill #0

## 2017-10-24 NOTE — Patient Instructions (Signed)

## 2017-10-24 NOTE — Progress Notes (Signed)
Patient ID: Karen Brown, female   DOB: 1961-01-17, 57 y.o.   MRN: [id number]    Subjective:  I acted as a Education administrator for Dr. Carollee Herter.  Karen Brown, [place]   Patient ID: Karen Brown, female    DOB: December 16, 1960, 57 y.o.   MRN: [id number]  Chief Complaint  Patient presents with  . Sinusitis    Sinusitis  This is a new problem. Episode onset: friday. Associated symptoms include congestion (nasal), ear pain (right), headaches, sinus pressure and a sore throat. Pertinent negatives include no coughing. Past treatments include acetaminophen.    Patient is in today for sinus issues.  Sore throat, ear pain and sinus pain x > 1 week    Mucus bloody and green.  + facial and dental pain.  No otc meds.    Patient Care Team: Carollee Herter, Alferd Apa, DO as PCP - General Nicholaus Bloom, MD as Consulting Physician (Anesthesiology) Jacelyn Pi, MD as Consulting Physician (Endocrinology) Phylliss Bob, MD as Consulting Physician (Orthopedic Surgery)   Past Medical History:  Diagnosis Date  . Anxiety   . Bipolar 1 disorder ([place])   . Chronic migraine   . Chronically dry eyes   . Colitis, ischemic ([place])   . Depression   . Diverticulosis   . Dry mouth   . Fibromyalgia   . GERD (gastroesophageal reflux disease)   . Hemorrhoids   . Hyperlipemia   . Hypertension   . Hypothyroidism   . IBS (irritable bowel syndrome)   . PVC (premature ventricular contraction)     Past Surgical History:  Procedure Laterality Date  . ABDOMINAL HYSTERECTOMY     Partial   . APPENDECTOMY    . BACK SURGERY     Micro diskectomy  . BACK SURGERY     L facet rhzotomy  . CHOLECYSTECTOMY    . TONSILLECTOMY      Family History  Problem Relation Age of Onset  . Kidney disease Father   . Coronary artery disease Father 43  . COPD Father 29       copd  . Heart disease Father   . Colon polyps Mother   . Osteoporosis Mother   . Colon cancer Neg Hx     Social History   Socioeconomic History  . Marital status:  Married    Spouse name: Not on file  . Number of children: 0  . Years of education: Not on file  . Highest education level: Not on file  Occupational History  . Occupation: Disabled     Employer: [employer]  Social Needs  . Financial resource strain: Not on file  . Food insecurity:    Worry: Not on file    Inability: Not on file  . Transportation needs:    Medical: Not on file    Non-medical: Not on file  Tobacco Use  . Smoking status: Never Smoker  . Smokeless tobacco: Never Used  Substance and Sexual Activity  . Alcohol use: No  . Drug use: No  . Sexual activity: Yes  Lifestyle  . Physical activity:    Days per week: Not on file    Minutes per session: Not on file  . Stress: Not on file  Relationships  . Social connections:    Talks on phone: Not on file    Gets together: Not on file    Attends religious service: Not on file    Active member of club or organization: Not on file    Attends meetings of  clubs or organizations: Not on file    Relationship status: Not on file  . Intimate partner violence:    Fear of current or ex partner: Not on file    Emotionally abused: Not on file    Physically abused: Not on file    Forced sexual activity: Not on file  Other Topics Concern  . Not on file  Social History Narrative   Caffeine occ     Outpatient Medications Prior to Visit  Medication Sig Dispense Refill  . butalbital-acetaminophen-caffeine (FIORICET, ESGIC) 50-325-40 MG tablet TAKE 1 TO 2 TABLETS BY MOUTH EVERY 6 HOURS AS NEEDED FOR HEADACHE 20 tablet 0  . Cholecalciferol (VITAMIN D-3) 5000 UNITS TABS Take 2 capsules by mouth daily.     . clidinium-chlordiazePOXIDE (LIBRAX) 5-2.5 MG per capsule TAKE ONE CAPSULE BY MOUTH 3 TIMES A DAY BEFORE MEALS AS NEEDED 90 capsule 2  . clonazePAM (KLONOPIN) 1 MG tablet Take 1 mg by mouth 3 (three) times daily as needed.      Marland Kitchen estradiol (VIVELLE-DOT) 0.075 MG/24HR Place 1 patch onto the skin once a week.     . fentaNYL  (DURAGESIC - DOSED MCG/HR) 75 MCG/HR Place 1 patch onto the skin every other day.      . lamoTRIgine (LAMICTAL) 25 MG tablet Take 25 mg by mouth 2 (two) times daily. 1 in the morning and 1 in the evening    . lisinopril (PRINIVIL,ZESTRIL) 20 MG tablet Take 1 tablet by mouth daily.    Marland Kitchen OVER THE COUNTER MEDICATION Take 3 tablets by mouth daily. cartilast    . PRISTIQ 100 MG 24 hr tablet Take 1 tablet by mouth daily.     . promethazine (PHENERGAN) 25 MG tablet Take 1 tablet (25 mg total) by mouth as needed. 90 tablet 0  . SYNTHROID 137 MCG tablet Take 137 mcg by mouth daily before breakfast.    . tiZANidine (ZANAFLEX) 4 MG capsule Take 4 mg by mouth 3 (three) times daily as needed for muscle spasms.    Marland Kitchen oxyCODONE-acetaminophen (PERCOCET) 10-325 MG tablet Take 1 tablet by mouth daily. Will take up to 6 tabs a day  0  . HYDROcodone-acetaminophen (NORCO) 10-325 MG per tablet Take by mouth daily. Will take up five a day    . isometheptene-acetaminophen-dichloralphenazone (MIDRIN) 65-100-325 MG capsule Take 2 capsules by mouth 4 (four) times daily as needed for migraine. Maximum 5 capsules in 12 hours for migraines 30 capsule 1   No facility-administered medications prior to visit.     Allergies  Allergen Reactions  . Biaxin [Clarithromycin] Other (See Comments)    Bad taste and ringing in ear with dec hearing  . Clindamycin/Lincomycin Dermatitis  . Ketorolac Tromethamine   . Pb-Hyoscy-Atropine-Scopolamine   . Penicillins     REACTION: HIVES  . Phenobarbital   . Sulfonamide Derivatives   . Topiramate   . Zaleplon     Review of Systems  HENT: Positive for congestion (nasal), ear pain (right), sinus pressure and sore throat.   Respiratory: Negative for cough.   Neurological: Positive for headaches.       Objective:    Physical Exam  Constitutional: She is oriented to person, place, and time. She appears well-developed and well-nourished.  HENT:  Right Ear: External ear normal.    Left Ear: External ear normal.  + PND + errythema  Eyes: Conjunctivae are normal. Right eye exhibits no discharge. Left eye exhibits no discharge.  Cardiovascular: Normal rate, regular rhythm and normal  heart sounds.  No murmur heard. Pulmonary/Chest: Effort normal and breath sounds normal. No respiratory distress. She has no wheezes. She has no rales. She exhibits no tenderness.  Musculoskeletal: She exhibits no edema.  Lymphadenopathy:    She has cervical adenopathy.  Neurological: She is alert and oriented to person, place, and time.  Nursing note and vitals reviewed.   BP 138/86 (BP Location: Right Arm, Cuff Size: Large)   Pulse 87   Temp 98.6 F (37 C) (Oral)   Resp 16   Ht 5\' 6"  (1.676 m)   Wt 211 lb 6.4 oz (95.9 kg)   SpO2 96%   BMI 34.12 kg/m  Wt Readings from Last 3 Encounters:  10/24/17 211 lb 6.4 oz (95.9 kg)  07/04/17 208 lb (94.3 kg)  07/04/17 208 lb 9.6 oz (94.6 kg)   BP Readings from Last 3 Encounters:  10/24/17 138/86  07/04/17 122/86  07/04/17 122/86     Immunization History  Administered Date(s) Administered  . Influenza Split 04/09/2011, 04/03/2012  . Influenza Whole 03/15/2010  . Influenza,inj,Quad PF,6+ Mos 04/07/2013, 03/26/2014, 03/22/2015, 04/13/2016  . Influenza-Unspecified 04/04/2017  . Td 01/23/2007  . Tdap 07/04/2017    Health Maintenance  Topic Date Due  . Hepatitis C Screening  07/05/2023 (Originally 02-09-1961)  . HIV Screening  07/05/2023 (Originally 03/11/1974)  . INFLUENZA VACCINE  01/09/2018  . MAMMOGRAM  03/06/2018  . COLONOSCOPY  12/10/2019  . TETANUS/TDAP  07/05/2027    Lab Results  Component Value Date   WBC 7.5 07/06/2016   HGB 15.2 (H) 07/06/2016   HCT 45.1 07/06/2016   PLT 279.0 07/06/2016   GLUCOSE 94 07/04/2016   CHOL 319 (H) 07/04/2016   TRIG 205.0 (H) 07/04/2016   HDL 58.60 07/04/2016   LDLDIRECT 217.0 07/04/2016   LDLCALC 190 (H) 06/27/2015   ALT 21 07/04/2016   AST 19 07/04/2016   NA 142 07/04/2016    K 4.0 07/04/2016   CL 102 07/04/2016   CREATININE 0.80 07/04/2016   BUN 11 07/04/2016   CO2 32 07/04/2016   TSH 0.82 02/10/2010   HGBA1C 5.4 08/15/2007    Lab Results  Component Value Date   TSH 0.82 02/10/2010   Lab Results  Component Value Date   WBC 7.5 07/06/2016   HGB 15.2 (H) 07/06/2016   HCT 45.1 07/06/2016   MCV 83.9 07/06/2016   PLT 279.0 07/06/2016   Lab Results  Component Value Date   NA 142 07/04/2016   K 4.0 07/04/2016   CO2 32 07/04/2016   GLUCOSE 94 07/04/2016   BUN 11 07/04/2016   CREATININE 0.80 07/04/2016   BILITOT 0.5 07/04/2016   ALKPHOS 72 07/04/2016   AST 19 07/04/2016   ALT 21 07/04/2016   PROT 7.7 07/04/2016   ALBUMIN 4.6 07/04/2016   CALCIUM 9.8 07/04/2016   GFR 78.49 07/04/2016   Lab Results  Component Value Date   CHOL 319 (H) 07/04/2016   Lab Results  Component Value Date   HDL 58.60 07/04/2016   Lab Results  Component Value Date   LDLCALC 190 (H) 06/27/2015   Lab Results  Component Value Date   TRIG 205.0 (H) 07/04/2016   Lab Results  Component Value Date   CHOLHDL 5 07/04/2016   Lab Results  Component Value Date   HGBA1C 5.4 08/15/2007         Assessment & Plan:   Problem List Items Addressed This Visit    None    Visit Diagnoses  Pansinusitis, unspecified chronicity    -  Primary   Relevant Medications   azithromycin (ZITHROMAX Z-PAK) 250 MG tablet   fluticasone (FLONASE) 50 MCG/ACT nasal spray   Sore throat       Relevant Medications   azithromycin (ZITHROMAX Z-PAK) 250 MG tablet   Other Relevant Orders   POCT rapid strep A   Culture, Group A Strep   Acute suppurative otitis media of right ear without spontaneous rupture of tympanic membrane, recurrence not specified       Relevant Medications   azithromycin (ZITHROMAX Z-PAK) 250 MG tablet   ofloxacin (FLOXIN OTIC) 0.3 % OTIC solution      I have discontinued Delia C. Dudash's HYDROcodone-acetaminophen and  isometheptene-acetaminophen-dichloralphenazone. I am also having her start on azithromycin, fluticasone, and ofloxacin. Additionally, I am having her maintain her lamoTRIgine, PRISTIQ, clonazePAM, Vitamin D-3, fentaNYL, estradiol, tiZANidine, clidinium-chlordiazePOXIDE, SYNTHROID, lisinopril, OVER THE COUNTER MEDICATION, promethazine, butalbital-acetaminophen-caffeine, and oxyCODONE-acetaminophen.  Meds ordered this encounter  Medications  . azithromycin (ZITHROMAX Z-PAK) 250 MG tablet    Sig: As directed    Dispense:  6 each    Refill:  0  . fluticasone (FLONASE) 50 MCG/ACT nasal spray    Sig: Place 2 sprays into both nostrils daily.    Dispense:  16 g    Refill:  6  . ofloxacin (FLOXIN OTIC) 0.3 % OTIC solution    Sig: Place 10 drops into the right ear daily.    Dispense:  5 mL    Refill:  0    CMA served as scribe during this visit. History, Physical and Plan performed by medical provider. Documentation and orders reviewed and attested to.  Ann Held, DO

## 2017-10-25 ENCOUNTER — Encounter: Payer: Self-pay | Admitting: Family Medicine

## 2017-10-25 LAB — POCT RAPID STREP A (OFFICE): Rapid Strep A Screen: NEGATIVE

## 2017-10-26 LAB — CULTURE, GROUP A STREP
MICRO NUMBER:: 90598011
SPECIMEN QUALITY:: ADEQUATE

## 2017-10-27 DIAGNOSIS — J302 Other seasonal allergic rhinitis: Secondary | ICD-10-CM | POA: Diagnosis not present

## 2017-10-28 ENCOUNTER — Ambulatory Visit: Payer: Self-pay | Admitting: *Deleted

## 2017-10-28 NOTE — Telephone Encounter (Signed)
Patient saw Dr. Carollee Herter on 5/16 for sinusitis and started a zpack, Mucinex DM. She began having fever, up to 101 degrees Saturday night. She reports a low grade fever yesterday. She continues to have thick brownish/yellow phlegm several times a day. Both ears feel clogged and chest feels tight. She was seen at [facility] in [facility] yesterday with no further treatment advised, continue current treatment. Appointment made per protocol.   Reason for Disposition . [1] Taking antibiotic > 72 hours (3 days) AND [2] sinus pain not improved  Answer Assessment - Initial Assessment Questions 1. ANTIBIOTIC: "What antibiotic are you receiving?" "How many times per day?"     zpack- today is last day  2. ONSET: "When was the antibiotic started?"    Thursday. 3. PAIN: "How bad is the sinus pain?"   (Scale 1-10; mild, moderate or severe)   - MILD (1-3): doesn't interfere with normal activities    - MODERATE (4-7): interferes with normal activities (e.g., work or school) or awakens from sleep   - SEVERE (8-10): excruciating pain and patient unable to do any normal activities        Rates her overall discomfort, ears, teeth sinuses a 7 on the scale.  4. FEVER: "Do you have a fever?" If so, ask: "What is it, how was it measured, and when did it start?"      Noticed Fever 101 Saturday evening.  5. SYMPTOMS: "Are there any other symptoms you're concerned about?" If so, ask: "When did it start?"     ears feel clogged, back teeth hurt, cough with phlegm that is thick yellowish and brownish-a few times a day. 6. PREGNANCY: "Is there any chance you are pregnant?" "When was your last menstrual period?"     no  Protocols used: SINUS INFECTION ON ANTIBIOTIC FOLLOW-UP CALL-A-AH

## 2017-10-29 ENCOUNTER — Ambulatory Visit (INDEPENDENT_AMBULATORY_CARE_PROVIDER_SITE_OTHER): Payer: Medicare Other | Admitting: Family Medicine

## 2017-10-29 ENCOUNTER — Encounter: Payer: Self-pay | Admitting: Family Medicine

## 2017-10-29 VITALS — BP 149/72 | HR 83 | Temp 98.7°F | Resp 16 | Ht 65.0 in | Wt 211.0 lb

## 2017-10-29 DIAGNOSIS — H6503 Acute serous otitis media, bilateral: Secondary | ICD-10-CM

## 2017-10-29 DIAGNOSIS — H66001 Acute suppurative otitis media without spontaneous rupture of ear drum, right ear: Secondary | ICD-10-CM

## 2017-10-29 MED ORDER — LEVOFLOXACIN 500 MG PO TABS: 500.0000 mg | ORAL_TABLET | Freq: Every day | ORAL | 0 refills | Status: DC

## 2017-10-29 MED ORDER — OFLOXACIN 0.3 % OT SOLN: 10.0000 [drp] | Freq: Every day | OTIC | 0 refills | Status: DC

## 2017-10-29 NOTE — Patient Instructions (Signed)

## 2017-10-29 NOTE — Progress Notes (Signed)
Subjective:  I acted as a Education administrator for [employer]. [name], [place]   Patient ID: Karen Brown, female    DOB: May 07, 1961, 57 y.o.   MRN: [id number]  Chief Complaint  Patient presents with  . Sinusitis    HPI  Patient is in today for follow up on sinusitis.  [facility] but she is a lot   Patient Care Team: Carollee Herter, Alferd Apa, DO as PCP - General Nicholaus Bloom, MD as Consulting Physician (Anesthesiology) Jacelyn Pi, MD as Consulting Physician (Endocrinology) Phylliss Bob, MD as Consulting Physician (Orthopedic Surgery)   Past Medical History:  Diagnosis Date  . Anxiety   . Bipolar 1 disorder ([place])   . Chronic migraine   . Chronically dry eyes   . Colitis, ischemic ([place])   . Depression   . Diverticulosis   . Dry mouth   . Fibromyalgia   . GERD (gastroesophageal reflux disease)   . Hemorrhoids   . Hyperlipemia   . Hypertension   . Hypothyroidism   . IBS (irritable bowel syndrome)   . PVC (premature ventricular contraction)     Past Surgical History:  Procedure Laterality Date  . ABDOMINAL HYSTERECTOMY     Partial   . APPENDECTOMY    . BACK SURGERY     Micro diskectomy  . BACK SURGERY     L facet rhzotomy  . CHOLECYSTECTOMY    . TONSILLECTOMY      Family History  Problem Relation Age of Onset  . Kidney disease Father   . Coronary artery disease Father 51  . COPD Father 23       copd  . Heart disease Father   . Colon polyps Mother   . Osteoporosis Mother   . Colon cancer Neg Hx     Social History   Socioeconomic History  . Marital status: Married    Spouse name: Not on file  . Number of children: 0  . Years of education: Not on file  . Highest education level: Not on file  Occupational History  . Occupation: Disabled     Employer: [employer]  Social Needs  . Financial resource strain: Not on file  . Food insecurity:    Worry: Not on file    Inability: Not on file  . Transportation needs:   Medical: Not on file    Non-medical: Not on file  Tobacco Use  . Smoking status: Never Smoker  . Smokeless tobacco: Never Used  Substance and Sexual Activity  . Alcohol use: No  . Drug use: No  . Sexual activity: Yes  Lifestyle  . Physical activity:    Days per week: Not on file    Minutes per session: Not on file  . Stress: Not on file  Relationships  . Social connections:    Talks on phone: Not on file    Gets together: Not on file    Attends religious service: Not on file    Active member of club or organization: Not on file    Attends meetings of clubs or organizations: Not on file    Relationship status: Not on file  . Intimate partner violence:    Fear of current or ex partner: Not on file    Emotionally abused: Not on file    Physically abused: Not on file    Forced sexual activity: Not on file  Other Topics Concern  . Not on file  Social History  Narrative   Caffeine occ     Outpatient Medications Prior to Visit  Medication Sig Dispense Refill  . azithromycin (ZITHROMAX Z-PAK) 250 MG tablet As directed 6 each 0  . butalbital-acetaminophen-caffeine (FIORICET, ESGIC) 50-325-40 MG tablet TAKE 1 TO 2 TABLETS BY MOUTH EVERY 6 HOURS AS NEEDED FOR HEADACHE 20 tablet 0  . Cholecalciferol (VITAMIN D-3) 5000 UNITS TABS Take 2 capsules by mouth daily.     . clidinium-chlordiazePOXIDE (LIBRAX) 5-2.5 MG per capsule TAKE ONE CAPSULE BY MOUTH 3 TIMES A DAY BEFORE MEALS AS NEEDED 90 capsule 2  . clonazePAM (KLONOPIN) 1 MG tablet Take 1 mg by mouth 3 (three) times daily as needed.      Marland Kitchen estradiol (VIVELLE-DOT) 0.075 MG/24HR Place 1 patch onto the skin once a week.     . fentaNYL (DURAGESIC - DOSED MCG/HR) 75 MCG/HR Place 1 patch onto the skin every other day.      . fluticasone (FLONASE) 50 MCG/ACT nasal spray Place 2 sprays into both nostrils daily. 16 g 6  . lamoTRIgine (LAMICTAL) 25 MG tablet Take 25 mg by mouth 2 (two) times daily. 1 in the morning and 1 in the evening    .  lisinopril (PRINIVIL,ZESTRIL) 20 MG tablet Take 1 tablet by mouth daily.    Marland Kitchen OVER THE COUNTER MEDICATION Take 3 tablets by mouth daily. cartilast    . oxyCODONE-acetaminophen (PERCOCET) 10-325 MG tablet Take 1 tablet by mouth daily. Will take up to 6 tabs a day  0  . PRISTIQ 100 MG 24 hr tablet Take 1 tablet by mouth daily.     . promethazine (PHENERGAN) 25 MG tablet Take 1 tablet (25 mg total) by mouth as needed. 90 tablet 0  . SYNTHROID 137 MCG tablet Take 137 mcg by mouth daily before breakfast.    . tiZANidine (ZANAFLEX) 4 MG capsule Take 4 mg by mouth 3 (three) times daily as needed for muscle spasms.    Marland Kitchen ofloxacin (FLOXIN OTIC) 0.3 % OTIC solution Place 10 drops into the right ear daily. 5 mL 0   No facility-administered medications prior to visit.     Allergies  Allergen Reactions  . Biaxin [Clarithromycin] Other (See Comments)    Bad taste and ringing in ear with dec hearing  . Clindamycin/Lincomycin Dermatitis  . Ketorolac Tromethamine   . Pb-Hyoscy-Atropine-Scopolamine   . Penicillins     REACTION: HIVES  . Phenobarbital   . Sulfonamide Derivatives   . Topiramate   . Zaleplon     ROS     Objective:    Physical Exam  BP (!) 149/72 (BP Location: Left Arm, Patient Position: Sitting, Cuff Size: Large)   Pulse 83   Temp 98.7 F (37.1 C) (Oral)   Resp 16   Ht 5\' 5"  (1.651 m)   Wt 211 lb (95.7 kg)   SpO2 98%   BMI 35.11 kg/m  Wt Readings from Last 3 Encounters:  10/29/17 211 lb (95.7 kg)  10/24/17 211 lb 6.4 oz (95.9 kg)  07/04/17 208 lb (94.3 kg)   BP Readings from Last 3 Encounters:  10/29/17 (!) 149/72  10/24/17 138/86  07/04/17 122/86     Immunization History  Administered Date(s) Administered  . Influenza Split 04/09/2011, 04/03/2012  . Influenza Whole 03/15/2010  . Influenza,inj,Quad PF,6+ Mos 04/07/2013, 03/26/2014, 03/22/2015, 04/13/2016  . Influenza-Unspecified 04/04/2017  . Td 01/23/2007  . Tdap 07/04/2017    Health Maintenance  Topic  Date Due  . Hepatitis C Screening  07/05/2023 (  Originally 1960/12/23)  . HIV Screening  07/05/2023 (Originally 03/11/1974)  . INFLUENZA VACCINE  01/09/2018  . MAMMOGRAM  03/06/2018  . COLONOSCOPY  12/10/2019  . TETANUS/TDAP  07/05/2027    Lab Results  Component Value Date   WBC 7.5 07/06/2016   HGB 15.2 (H) 07/06/2016   HCT 45.1 07/06/2016   PLT 279.0 07/06/2016   GLUCOSE 94 07/04/2016   CHOL 319 (H) 07/04/2016   TRIG 205.0 (H) 07/04/2016   HDL 58.60 07/04/2016   LDLDIRECT 217.0 07/04/2016   LDLCALC 190 (H) 06/27/2015   ALT 21 07/04/2016   AST 19 07/04/2016   NA 142 07/04/2016   K 4.0 07/04/2016   CL 102 07/04/2016   CREATININE 0.80 07/04/2016   BUN 11 07/04/2016   CO2 32 07/04/2016   TSH 0.82 02/10/2010   HGBA1C 5.4 08/15/2007    Lab Results  Component Value Date   TSH 0.82 02/10/2010   Lab Results  Component Value Date   WBC 7.5 07/06/2016   HGB 15.2 (H) 07/06/2016   HCT 45.1 07/06/2016   MCV 83.9 07/06/2016   PLT 279.0 07/06/2016   Lab Results  Component Value Date   NA 142 07/04/2016   K 4.0 07/04/2016   CO2 32 07/04/2016   GLUCOSE 94 07/04/2016   BUN 11 07/04/2016   CREATININE 0.80 07/04/2016   BILITOT 0.5 07/04/2016   ALKPHOS 72 07/04/2016   AST 19 07/04/2016   ALT 21 07/04/2016   PROT 7.7 07/04/2016   ALBUMIN 4.6 07/04/2016   CALCIUM 9.8 07/04/2016   GFR 78.49 07/04/2016   Lab Results  Component Value Date   CHOL 319 (H) 07/04/2016   Lab Results  Component Value Date   HDL 58.60 07/04/2016   Lab Results  Component Value Date   LDLCALC 190 (H) 06/27/2015   Lab Results  Component Value Date   TRIG 205.0 (H) 07/04/2016   Lab Results  Component Value Date   CHOLHDL 5 07/04/2016   Lab Results  Component Value Date   HGBA1C 5.4 08/15/2007         Assessment & Plan:   Problem List Items Addressed This Visit    None    Visit Diagnoses    Bilateral acute serous otitis media, recurrence not specified    -  Primary   Relevant  Medications   levofloxacin (LEVAQUIN) 500 MG tablet   Acute suppurative otitis media of right ear without spontaneous rupture of tympanic membrane, recurrence not specified       Relevant Medications   levofloxacin (LEVAQUIN) 500 MG tablet   ofloxacin (FLOXIN OTIC) 0.3 % OTIC solution    con't the otc meds  I have changed Tim C. Bamford's ofloxacin. I am also having her start on levofloxacin. Additionally, I am having her maintain her lamoTRIgine, PRISTIQ, clonazePAM, Vitamin D-3, fentaNYL, estradiol, tiZANidine, clidinium-chlordiazePOXIDE, SYNTHROID, lisinopril, OVER THE COUNTER MEDICATION, promethazine, butalbital-acetaminophen-caffeine, oxyCODONE-acetaminophen, azithromycin, and fluticasone.  Meds ordered this encounter  Medications  . levofloxacin (LEVAQUIN) 500 MG tablet    Sig: Take 1 tablet (500 mg total) by mouth daily.    Dispense:  7 tablet    Refill:  0  . ofloxacin (FLOXIN OTIC) 0.3 % OTIC solution    Sig: Place 10 drops into both ears daily.    Dispense:  10 mL    Refill:  0    CMA served as scribe during this visit. History, Physical and Plan performed by medical provider. Documentation and orders reviewed and attested to.  Yvonne R Lowne Chase, DO 

## 2017-10-31 ENCOUNTER — Encounter: Payer: Self-pay | Admitting: Family Medicine

## 2017-10-31 NOTE — Telephone Encounter (Signed)
Please get her in tomorrow.

## 2017-11-01 ENCOUNTER — Ambulatory Visit (INDEPENDENT_AMBULATORY_CARE_PROVIDER_SITE_OTHER): Payer: Medicare Other | Admitting: Family Medicine

## 2017-11-01 ENCOUNTER — Encounter: Payer: Self-pay | Admitting: Family Medicine

## 2017-11-01 VITALS — BP 134/78 | HR 88 | Temp 98.4°F | Resp 16 | Wt 211.0 lb

## 2017-11-01 DIAGNOSIS — H6693 Otitis media, unspecified, bilateral: Secondary | ICD-10-CM

## 2017-11-01 MED ORDER — METHYLPREDNISOLONE ACETATE 80 MG/ML IJ SUSP
80.0000 mg | Freq: Once | INTRAMUSCULAR | Status: AC
Start: 2017-11-01 — End: 2017-11-01
  Administered 2017-11-01: 80 mg via INTRAMUSCULAR

## 2017-11-01 NOTE — Progress Notes (Signed)
Subjective:  I acted as a Education administrator for [employer]. [name], [name]   Patient ID: Karen Brown, female    DOB: 1960/09/29, 57 y.o.   MRN: [id number]  Chief Complaint  Patient presents with  . Otitis Media    HPI  Patient is in today for follow up on otitis media.  Her ears feel clogged-- she is on day 3 of the levaquin.  No other complaints.    Patient Care Team: Carollee Herter, Alferd Apa, DO as PCP - General Nicholaus Bloom, MD as Consulting Physician (Anesthesiology) Jacelyn Pi, MD as Consulting Physician (Endocrinology) Phylliss Bob, MD as Consulting Physician (Orthopedic Surgery)   Past Medical History:  Diagnosis Date  . Anxiety   . Bipolar 1 disorder ([place])   . Chronic migraine   . Chronically dry eyes   . Colitis, ischemic ([place])   . Depression   . Diverticulosis   . Dry mouth   . Fibromyalgia   . GERD (gastroesophageal reflux disease)   . Hemorrhoids   . Hyperlipemia   . Hypertension   . Hypothyroidism   . IBS (irritable bowel syndrome)   . PVC (premature ventricular contraction)     Past Surgical History:  Procedure Laterality Date  . ABDOMINAL HYSTERECTOMY     Partial   . APPENDECTOMY    . BACK SURGERY     Micro diskectomy  . BACK SURGERY     L facet rhzotomy  . CHOLECYSTECTOMY    . TONSILLECTOMY      Family History  Problem Relation Age of Onset  . Kidney disease Father   . Coronary artery disease Father 73  . COPD Father 5       copd  . Heart disease Father   . Colon polyps Mother   . Osteoporosis Mother   . Colon cancer Neg Hx     Social History   Socioeconomic History  . Marital status: Married    Spouse name: Not on file  . Number of children: 0  . Years of education: Not on file  . Highest education level: Not on file  Occupational History  . Occupation: Disabled     Employer: [employer]  Social Needs  . Financial resource strain: Not on file  . Food insecurity:    Worry: Not on file    Inability: Not on file  .  Transportation needs:    Medical: Not on file    Non-medical: Not on file  Tobacco Use  . Smoking status: Never Smoker  . Smokeless tobacco: Never Used  Substance and Sexual Activity  . Alcohol use: No  . Drug use: No  . Sexual activity: Yes  Lifestyle  . Physical activity:    Days per week: Not on file    Minutes per session: Not on file  . Stress: Not on file  Relationships  . Social connections:    Talks on phone: Not on file    Gets together: Not on file    Attends religious service: Not on file    Active member of club or organization: Not on file    Attends meetings of clubs or organizations: Not on file    Relationship status: Not on file  . Intimate partner violence:    Fear of current or ex partner: Not on file    Emotionally abused: Not on file    Physically abused: Not on file    Forced sexual activity: Not on file  Other Topics Concern  . Not  on file  Social History Narrative   Caffeine occ     Outpatient Medications Prior to Visit  Medication Sig Dispense Refill  . azithromycin (ZITHROMAX Z-PAK) 250 MG tablet As directed 6 each 0  . butalbital-acetaminophen-caffeine (FIORICET, ESGIC) 50-325-40 MG tablet TAKE 1 TO 2 TABLETS BY MOUTH EVERY 6 HOURS AS NEEDED FOR HEADACHE 20 tablet 0  . Cholecalciferol (VITAMIN D-3) 5000 UNITS TABS Take 2 capsules by mouth daily.     . clidinium-chlordiazePOXIDE (LIBRAX) 5-2.5 MG per capsule TAKE ONE CAPSULE BY MOUTH 3 TIMES A DAY BEFORE MEALS AS NEEDED 90 capsule 2  . clonazePAM (KLONOPIN) 1 MG tablet Take 1 mg by mouth 3 (three) times daily as needed.      Marland Kitchen estradiol (VIVELLE-DOT) 0.075 MG/24HR Place 1 patch onto the skin once a week.     . fentaNYL (DURAGESIC - DOSED MCG/HR) 75 MCG/HR Place 1 patch onto the skin every other day.      . fluticasone (FLONASE) 50 MCG/ACT nasal spray Place 2 sprays into both nostrils daily. 16 g 6  . lamoTRIgine (LAMICTAL) 25 MG tablet Take 25 mg by mouth 2 (two) times daily. 1 in the morning and  1 in the evening    . levofloxacin (LEVAQUIN) 500 MG tablet Take 1 tablet (500 mg total) by mouth daily. 7 tablet 0  . lisinopril (PRINIVIL,ZESTRIL) 20 MG tablet Take 1 tablet by mouth daily.    Marland Kitchen ofloxacin (FLOXIN OTIC) 0.3 % OTIC solution Place 10 drops into both ears daily. 10 mL 0  . OVER THE COUNTER MEDICATION Take 3 tablets by mouth daily. cartilast    . oxyCODONE-acetaminophen (PERCOCET) 10-325 MG tablet Take 1 tablet by mouth daily. Will take up to 6 tabs a day  0  . PRISTIQ 100 MG 24 hr tablet Take 1 tablet by mouth daily.     . promethazine (PHENERGAN) 25 MG tablet Take 1 tablet (25 mg total) by mouth as needed. 90 tablet 0  . SYNTHROID 137 MCG tablet Take 137 mcg by mouth daily before breakfast.    . tiZANidine (ZANAFLEX) 4 MG capsule Take 4 mg by mouth 3 (three) times daily as needed for muscle spasms.     No facility-administered medications prior to visit.     Allergies  Allergen Reactions  . Biaxin [Clarithromycin] Other (See Comments)    Bad taste and ringing in ear with dec hearing  . Clindamycin/Lincomycin Dermatitis  . Ketorolac Tromethamine   . Pb-Hyoscy-Atropine-Scopolamine   . Penicillins     REACTION: HIVES  . Phenobarbital   . Sulfonamide Derivatives   . Topiramate   . Zaleplon     Review of Systems  Constitutional: Negative for chills, fever and malaise/fatigue.  HENT: Positive for ear discharge and ear pain. Negative for congestion and hearing loss.   Eyes: Negative for discharge.  Respiratory: Negative for cough, sputum production and shortness of breath.   Cardiovascular: Negative for chest pain, palpitations and leg swelling.  Gastrointestinal: Negative for abdominal pain, blood in stool, constipation, diarrhea, heartburn, nausea and vomiting.  Genitourinary: Negative for dysuria, frequency, hematuria and urgency.  Musculoskeletal: Negative for back pain, falls and myalgias.  Skin: Negative for rash.  Neurological: Negative for dizziness, sensory  change, loss of consciousness, weakness and headaches.  Endo/Heme/Allergies: Negative for environmental allergies. Does not bruise/bleed easily.  Psychiatric/Behavioral: Negative for depression and suicidal ideas. The patient is not nervous/anxious and does not have insomnia.        Objective:  Physical Exam  Constitutional: She is oriented to person, place, and time. She appears well-developed and well-nourished.  HENT:  Head: Normocephalic and atraumatic.  Right Ear: Tympanic membrane is bulging. Decreased hearing is noted.  Left Ear: Tympanic membrane is injected and erythematous. Decreased hearing is noted.  Eyes: Conjunctivae and EOM are normal.  Neck: Normal range of motion. Neck supple. No JVD present. Carotid bruit is not present. No thyromegaly present.  Cardiovascular: Normal rate, regular rhythm and normal heart sounds.  No murmur heard. Pulmonary/Chest: Effort normal and breath sounds normal. No respiratory distress. She has no wheezes. She has no rales. She exhibits no tenderness.  Musculoskeletal: She exhibits no edema.  Neurological: She is alert and oriented to person, place, and time.  Psychiatric: She has a normal mood and affect.  Nursing note and vitals reviewed.   BP 134/78 (BP Location: Left Arm, Patient Position: Sitting, Cuff Size: Large)   Pulse 88   Temp 98.4 F (36.9 C) (Oral)   Resp 16   Wt 211 lb (95.7 kg)   SpO2 96%   BMI 35.11 kg/m  Wt Readings from Last 3 Encounters:  11/01/17 211 lb (95.7 kg)  10/29/17 211 lb (95.7 kg)  10/24/17 211 lb 6.4 oz (95.9 kg)   BP Readings from Last 3 Encounters:  11/01/17 134/78  10/29/17 (!) 149/72  10/24/17 138/86     Immunization History  Administered Date(s) Administered  . Influenza Split 04/09/2011, 04/03/2012  . Influenza Whole 03/15/2010  . Influenza,inj,Quad PF,6+ Mos 04/07/2013, 03/26/2014, 03/22/2015, 04/13/2016  . Influenza-Unspecified 04/04/2017  . Td 01/23/2007  . Tdap 07/04/2017      Health Maintenance  Topic Date Due  . Hepatitis C Screening  07/05/2023 (Originally 18-Apr-1961)  . HIV Screening  07/05/2023 (Originally 03/11/1974)  . INFLUENZA VACCINE  01/09/2018  . MAMMOGRAM  03/06/2018  . COLONOSCOPY  12/10/2019  . TETANUS/TDAP  07/05/2027    Lab Results  Component Value Date   WBC 7.5 07/06/2016   HGB 15.2 (H) 07/06/2016   HCT 45.1 07/06/2016   PLT 279.0 07/06/2016   GLUCOSE 94 07/04/2016   CHOL 319 (H) 07/04/2016   TRIG 205.0 (H) 07/04/2016   HDL 58.60 07/04/2016   LDLDIRECT 217.0 07/04/2016   LDLCALC 190 (H) 06/27/2015   ALT 21 07/04/2016   AST 19 07/04/2016   NA 142 07/04/2016   K 4.0 07/04/2016   CL 102 07/04/2016   CREATININE 0.80 07/04/2016   BUN 11 07/04/2016   CO2 32 07/04/2016   TSH 0.82 02/10/2010   HGBA1C 5.4 08/15/2007    Lab Results  Component Value Date   TSH 0.82 02/10/2010   Lab Results  Component Value Date   WBC 7.5 07/06/2016   HGB 15.2 (H) 07/06/2016   HCT 45.1 07/06/2016   MCV 83.9 07/06/2016   PLT 279.0 07/06/2016   Lab Results  Component Value Date   NA 142 07/04/2016   K 4.0 07/04/2016   CO2 32 07/04/2016   GLUCOSE 94 07/04/2016   BUN 11 07/04/2016   CREATININE 0.80 07/04/2016   BILITOT 0.5 07/04/2016   ALKPHOS 72 07/04/2016   AST 19 07/04/2016   ALT 21 07/04/2016   PROT 7.7 07/04/2016   ALBUMIN 4.6 07/04/2016   CALCIUM 9.8 07/04/2016   GFR 78.49 07/04/2016   Lab Results  Component Value Date   CHOL 319 (H) 07/04/2016   Lab Results  Component Value Date   HDL 58.60 07/04/2016   Lab Results  Component Value Date  [place] 190 (H) 06/27/2015   Lab Results  Component Value Date   TRIG 205.0 (H) 07/04/2016   Lab Results  Component Value Date   CHOLHDL 5 07/04/2016   Lab Results  Component Value Date   HGBA1C 5.4 08/15/2007         Assessment & Plan:   Problem List Items Addressed This Visit    None    Visit Diagnoses    Otitis media follow-up, not resolved, bilateral    -   Primary   Relevant Medications   methylPREDNISolone acetate (DEPO-MEDROL) injection 80 mg (Start on 11/01/2017 11:30 AM)   Other Relevant Orders   Ambulatory referral to ENT    finish abx Antihistamine otc con't flonase Depo medrol 80 mg IM   I am having Karen Brown maintain her lamoTRIgine, PRISTIQ, clonazePAM, Vitamin D-3, fentaNYL, estradiol, tiZANidine, clidinium-chlordiazePOXIDE, SYNTHROID, lisinopril, OVER THE COUNTER MEDICATION, promethazine, butalbital-acetaminophen-caffeine, oxyCODONE-acetaminophen, azithromycin, fluticasone, levofloxacin, and ofloxacin. We will continue to administer methylPREDNISolone acetate.  Meds ordered this encounter  Medications  . methylPREDNISolone acetate (DEPO-MEDROL) injection 80 mg    CMA served as scribe during this visit. History, Physical and Plan performed by medical provider. Documentation and orders reviewed and attested to.  Ann Held, DO

## 2017-11-01 NOTE — Telephone Encounter (Signed)
Patient came in for appointment today to see PCP

## 2017-11-01 NOTE — Telephone Encounter (Signed)
Patient seen in office today. 

## 2017-11-01 NOTE — Patient Instructions (Signed)
Otitis Media, Adult Otitis media is redness, soreness, and puffiness (swelling) in the space just behind your eardrum (middle ear). It may be caused by allergies or infection. It often happens along with a cold. Follow these instructions at home:  Take your medicine as told. Finish it even if you start to feel better.  Only take over-the-counter or prescription medicines for pain, discomfort, or fever as told by your doctor.  Follow up with your doctor as told. Contact a doctor if:  You have otitis media only in one ear, or bleeding from your nose, or both.  You notice a lump on your neck.  You are not getting better in 3-5 days.  You feel worse instead of better. Get help right away if:  You have pain that is not helped with medicine.  You have puffiness, redness, or pain around your ear.  You get a stiff neck.  You cannot move part of your face (paralysis).  You notice that the bone behind your ear hurts when you touch it. This information is not intended to replace advice given to you by your health care provider. Make sure you discuss any questions you have with your health care provider. Document Released: 11/14/2007 Document Revised: 11/03/2015 Document Reviewed: 12/23/2012 Elsevier Interactive Patient Education  2017 Elsevier Inc.  

## 2017-11-05 ENCOUNTER — Encounter: Payer: Self-pay | Admitting: Family Medicine

## 2017-11-05 NOTE — Telephone Encounter (Signed)
I have sent a message to [facility] about this.

## 2017-11-06 ENCOUNTER — Ambulatory Visit: Payer: Medicare Other | Admitting: Psychology

## 2017-11-06 DIAGNOSIS — F331 Major depressive disorder, recurrent, moderate: Secondary | ICD-10-CM

## 2017-11-07 ENCOUNTER — Telehealth: Payer: Self-pay | Admitting: *Deleted

## 2017-11-07 DIAGNOSIS — G894 Chronic pain syndrome: Secondary | ICD-10-CM | POA: Diagnosis not present

## 2017-11-07 DIAGNOSIS — M62838 Other muscle spasm: Secondary | ICD-10-CM | POA: Diagnosis not present

## 2017-11-07 DIAGNOSIS — M4726 Other spondylosis with radiculopathy, lumbar region: Secondary | ICD-10-CM | POA: Diagnosis not present

## 2017-11-07 DIAGNOSIS — Z79891 Long term (current) use of opiate analgesic: Secondary | ICD-10-CM | POA: Diagnosis not present

## 2017-11-07 NOTE — Telephone Encounter (Signed)
Copied from [place] [id number]. Topic: Referral - Status >> Nov 07, 2017  2:07 PM Synthia Innocent wrote: Reason for CRM: Checking status of ENT referral

## 2017-11-08 ENCOUNTER — Encounter: Payer: Self-pay | Admitting: Family Medicine

## 2017-11-08 NOTE — Telephone Encounter (Addendum)
Patient has self referred to Dr Jenell Milliner on  11/11/17 w/ [facility] in [place] Ph # [phone]. Requesting records to be sent. Upset that no one called her back

## 2017-11-11 DIAGNOSIS — H6503 Acute serous otitis media, bilateral: Secondary | ICD-10-CM | POA: Diagnosis not present

## 2017-11-11 DIAGNOSIS — H900 Conductive hearing loss, bilateral: Secondary | ICD-10-CM | POA: Diagnosis not present

## 2017-12-03 DIAGNOSIS — H6983 Other specified disorders of Eustachian tube, bilateral: Secondary | ICD-10-CM | POA: Diagnosis not present

## 2017-12-05 DIAGNOSIS — Z79891 Long term (current) use of opiate analgesic: Secondary | ICD-10-CM | POA: Diagnosis not present

## 2017-12-05 DIAGNOSIS — M4726 Other spondylosis with radiculopathy, lumbar region: Secondary | ICD-10-CM | POA: Diagnosis not present

## 2017-12-05 DIAGNOSIS — G894 Chronic pain syndrome: Secondary | ICD-10-CM | POA: Diagnosis not present

## 2017-12-05 DIAGNOSIS — M62838 Other muscle spasm: Secondary | ICD-10-CM | POA: Diagnosis not present

## 2017-12-11 ENCOUNTER — Ambulatory Visit: Payer: Medicare Other | Admitting: Psychology

## 2017-12-11 DIAGNOSIS — F331 Major depressive disorder, recurrent, moderate: Secondary | ICD-10-CM

## 2018-01-02 DIAGNOSIS — M4726 Other spondylosis with radiculopathy, lumbar region: Secondary | ICD-10-CM | POA: Diagnosis not present

## 2018-01-02 DIAGNOSIS — G894 Chronic pain syndrome: Secondary | ICD-10-CM | POA: Diagnosis not present

## 2018-01-02 DIAGNOSIS — M62838 Other muscle spasm: Secondary | ICD-10-CM | POA: Diagnosis not present

## 2018-01-02 DIAGNOSIS — Z79891 Long term (current) use of opiate analgesic: Secondary | ICD-10-CM | POA: Diagnosis not present

## 2018-01-08 ENCOUNTER — Ambulatory Visit: Payer: Medicare Other | Admitting: Psychology

## 2018-01-08 DIAGNOSIS — F331 Major depressive disorder, recurrent, moderate: Secondary | ICD-10-CM

## 2018-01-23 ENCOUNTER — Other Ambulatory Visit: Payer: Self-pay | Admitting: Family Medicine

## 2018-01-23 DIAGNOSIS — G43809 Other migraine, not intractable, without status migrainosus: Secondary | ICD-10-CM

## 2018-01-24 NOTE — Telephone Encounter (Signed)
Requesting:butalbital-acetaminophen-caffeine Contract:none, needs csc YPP:JKDT, needs uds Last Visit:11/01/17 Next Visit:07/08/17 Last Refill:09/26/17  Please Advise  Do you need me to print database if patient has not had csc or uds? I will be happy to print if needed.

## 2018-01-29 ENCOUNTER — Other Ambulatory Visit: Payer: Self-pay | Admitting: *Deleted

## 2018-01-29 DIAGNOSIS — G894 Chronic pain syndrome: Secondary | ICD-10-CM | POA: Diagnosis not present

## 2018-01-29 DIAGNOSIS — Z79891 Long term (current) use of opiate analgesic: Secondary | ICD-10-CM | POA: Diagnosis not present

## 2018-01-29 DIAGNOSIS — M4726 Other spondylosis with radiculopathy, lumbar region: Secondary | ICD-10-CM | POA: Diagnosis not present

## 2018-01-29 DIAGNOSIS — M62838 Other muscle spasm: Secondary | ICD-10-CM | POA: Diagnosis not present

## 2018-01-29 NOTE — Telephone Encounter (Signed)
rx called in to [facility]

## 2018-02-05 ENCOUNTER — Ambulatory Visit: Payer: Medicare Other | Admitting: Psychology

## 2018-02-05 DIAGNOSIS — F331 Major depressive disorder, recurrent, moderate: Secondary | ICD-10-CM

## 2018-02-25 ENCOUNTER — Other Ambulatory Visit: Payer: Self-pay | Admitting: Obstetrics and Gynecology

## 2018-02-25 DIAGNOSIS — Z1231 Encounter for screening mammogram for malignant neoplasm of breast: Secondary | ICD-10-CM

## 2018-02-26 DIAGNOSIS — Z79891 Long term (current) use of opiate analgesic: Secondary | ICD-10-CM | POA: Diagnosis not present

## 2018-02-26 DIAGNOSIS — G894 Chronic pain syndrome: Secondary | ICD-10-CM | POA: Diagnosis not present

## 2018-02-26 DIAGNOSIS — M62838 Other muscle spasm: Secondary | ICD-10-CM | POA: Diagnosis not present

## 2018-02-26 DIAGNOSIS — M4726 Other spondylosis with radiculopathy, lumbar region: Secondary | ICD-10-CM | POA: Diagnosis not present

## 2018-03-05 ENCOUNTER — Ambulatory Visit: Payer: Medicare Other | Admitting: Psychology

## 2018-03-25 ENCOUNTER — Ambulatory Visit: Payer: Self-pay

## 2018-03-27 DIAGNOSIS — M62838 Other muscle spasm: Secondary | ICD-10-CM | POA: Diagnosis not present

## 2018-03-27 DIAGNOSIS — M4726 Other spondylosis with radiculopathy, lumbar region: Secondary | ICD-10-CM | POA: Diagnosis not present

## 2018-03-27 DIAGNOSIS — Z79891 Long term (current) use of opiate analgesic: Secondary | ICD-10-CM | POA: Diagnosis not present

## 2018-03-27 DIAGNOSIS — G894 Chronic pain syndrome: Secondary | ICD-10-CM | POA: Diagnosis not present

## 2018-04-09 ENCOUNTER — Ambulatory Visit: Payer: Medicare Other | Admitting: Psychology

## 2018-04-09 DIAGNOSIS — F331 Major depressive disorder, recurrent, moderate: Secondary | ICD-10-CM

## 2018-04-17 ENCOUNTER — Other Ambulatory Visit: Payer: Self-pay | Admitting: Family Medicine

## 2018-04-17 DIAGNOSIS — G43809 Other migraine, not intractable, without status migrainosus: Secondary | ICD-10-CM

## 2018-04-21 NOTE — Telephone Encounter (Signed)
Database ran and is on your desk for review.  Last written: 01/24/18 Last ov: 11/01/17 Next ov: 07/08/18 Contract:  UDS:   Not sure if you want to do uds and contract for this drug.  It never shows up on database.

## 2018-04-24 DIAGNOSIS — G894 Chronic pain syndrome: Secondary | ICD-10-CM | POA: Diagnosis not present

## 2018-04-24 DIAGNOSIS — Z79891 Long term (current) use of opiate analgesic: Secondary | ICD-10-CM | POA: Diagnosis not present

## 2018-04-24 DIAGNOSIS — M62838 Other muscle spasm: Secondary | ICD-10-CM | POA: Diagnosis not present

## 2018-04-24 DIAGNOSIS — M4726 Other spondylosis with radiculopathy, lumbar region: Secondary | ICD-10-CM | POA: Diagnosis not present

## 2018-05-06 ENCOUNTER — Ambulatory Visit: Payer: Medicare Other | Admitting: Psychology

## 2018-05-06 DIAGNOSIS — F331 Major depressive disorder, recurrent, moderate: Secondary | ICD-10-CM | POA: Diagnosis not present

## 2018-05-20 DIAGNOSIS — E559 Vitamin D deficiency, unspecified: Secondary | ICD-10-CM | POA: Diagnosis not present

## 2018-05-20 DIAGNOSIS — E039 Hypothyroidism, unspecified: Secondary | ICD-10-CM | POA: Diagnosis not present

## 2018-05-20 DIAGNOSIS — I10 Essential (primary) hypertension: Secondary | ICD-10-CM | POA: Diagnosis not present

## 2018-05-20 DIAGNOSIS — R7301 Impaired fasting glucose: Secondary | ICD-10-CM | POA: Diagnosis not present

## 2018-05-22 DIAGNOSIS — G894 Chronic pain syndrome: Secondary | ICD-10-CM | POA: Diagnosis not present

## 2018-05-22 DIAGNOSIS — Z79891 Long term (current) use of opiate analgesic: Secondary | ICD-10-CM | POA: Diagnosis not present

## 2018-05-22 DIAGNOSIS — M62838 Other muscle spasm: Secondary | ICD-10-CM | POA: Diagnosis not present

## 2018-05-22 DIAGNOSIS — M4726 Other spondylosis with radiculopathy, lumbar region: Secondary | ICD-10-CM | POA: Diagnosis not present

## 2018-05-28 ENCOUNTER — Ambulatory Visit: Payer: Self-pay

## 2018-06-09 ENCOUNTER — Ambulatory Visit: Payer: Medicare Other | Admitting: Psychology

## 2018-06-19 DIAGNOSIS — Z79891 Long term (current) use of opiate analgesic: Secondary | ICD-10-CM | POA: Diagnosis not present

## 2018-06-19 DIAGNOSIS — M4726 Other spondylosis with radiculopathy, lumbar region: Secondary | ICD-10-CM | POA: Diagnosis not present

## 2018-06-19 DIAGNOSIS — M62838 Other muscle spasm: Secondary | ICD-10-CM | POA: Diagnosis not present

## 2018-06-19 DIAGNOSIS — G894 Chronic pain syndrome: Secondary | ICD-10-CM | POA: Diagnosis not present

## 2018-07-07 NOTE — Progress Notes (Deleted)
Subjective:   Karen Brown is a 58 y.o. female who presents for Medicare Annual (Subsequent) preventive examination.  Review of Systems: No ROS.  Medicare Wellness Visit. Additional risk factors are reflected in the social history.   Sleep patterns:  Home Safety/Smoke Alarms: Feels safe in home. Smoke alarms in place.     Female:   Pap-  Pt declines     Mammo-active order       Dexa scan-07/12/16        CCS- last reported 12/09/09 w/ 10 yr recall     Objective:     Vitals: There were no vitals taken for this visit.  There is no height or weight on file to calculate BMI.  Advanced Directives 07/04/2017 07/02/2016 06/28/2015  Does Patient Have a Medical Advance Directive? No No Yes  Type of Advance Directive - Public librarian;Living will  Does patient want to make changes to medical advance directive? - - No - Patient declined  Copy of [place] in Chart? - - No - copy requested  Would patient like information on creating a medical advance directive? Yes (MAU/Ambulatory/Procedural Areas - Information given) Yes (MAU/Ambulatory/Procedural Areas - Information given) -    Tobacco Social History   Tobacco Use  Smoking Status Never Smoker  Smokeless Tobacco Never Used     Counseling given: Not Answered   Clinical Intake:                       Past Medical History:  Diagnosis Date  . Anxiety   . Bipolar 1 disorder ([place])   . Chronic migraine   . Chronically dry eyes   . Colitis, ischemic ([place])   . Depression   . Diverticulosis   . Dry mouth   . Fibromyalgia   . GERD (gastroesophageal reflux disease)   . Hemorrhoids   . Hyperlipemia   . Hypertension   . Hypothyroidism   . IBS (irritable bowel syndrome)   . PVC (premature ventricular contraction)    Past Surgical History:  Procedure Laterality Date  . ABDOMINAL HYSTERECTOMY     Partial   . APPENDECTOMY    . BACK SURGERY     Micro diskectomy  . BACK SURGERY     L  facet rhzotomy  . CHOLECYSTECTOMY    . TONSILLECTOMY     Family History  Problem Relation Age of Onset  . Kidney disease Father   . Coronary artery disease Father 16  . COPD Father 48       copd  . Heart disease Father   . Colon polyps Mother   . Osteoporosis Mother   . Colon cancer Neg Hx    Social History   Socioeconomic History  . Marital status: Married    Spouse name: Not on file  . Number of children: 0  . Years of education: Not on file  . Highest education level: Not on file  Occupational History  . Occupation: Disabled     Employer: [employer]  Social Needs  . Financial resource strain: Not on file  . Food insecurity:    Worry: Not on file    Inability: Not on file  . Transportation needs:    Medical: Not on file    Non-medical: Not on file  Tobacco Use  . Smoking status: Never Smoker  . Smokeless tobacco: Never Used  Substance and Sexual Activity  . Alcohol use: No  . Drug use: No  .  Sexual activity: Yes  Lifestyle  . Physical activity:    Days per week: Not on file    Minutes per session: Not on file  . Stress: Not on file  Relationships  . Social connections:    Talks on phone: Not on file    Gets together: Not on file    Attends religious service: Not on file    Active member of club or organization: Not on file    Attends meetings of clubs or organizations: Not on file    Relationship status: Not on file  Other Topics Concern  . Not on file  Social History Narrative   Caffeine occ     Outpatient Encounter Medications as of 07/08/2018  Medication Sig  . azithromycin (ZITHROMAX Z-PAK) 250 MG tablet As directed  . butalbital-acetaminophen-caffeine (FIORICET, ESGIC) 50-325-40 MG tablet TAKE 1 TO 2 TABLETS BY MOUTH EVERY 6 HOURS AS NEEDED FOR HEADACHE  . Cholecalciferol (VITAMIN D-3) 5000 UNITS TABS Take 2 capsules by mouth daily.   . clidinium-chlordiazePOXIDE (LIBRAX) 5-2.5 MG per capsule TAKE ONE CAPSULE BY MOUTH 3 TIMES A DAY BEFORE MEALS  AS NEEDED  . clonazePAM (KLONOPIN) 1 MG tablet Take 1 mg by mouth 3 (three) times daily as needed.    Marland Kitchen estradiol (VIVELLE-DOT) 0.075 MG/24HR Place 1 patch onto the skin once a week.   . fentaNYL (DURAGESIC - DOSED MCG/HR) 75 MCG/HR Place 1 patch onto the skin every other day.    . fluticasone (FLONASE) 50 MCG/ACT nasal spray Place 2 sprays into both nostrils daily.  Marland Kitchen lamoTRIgine (LAMICTAL) 25 MG tablet Take 25 mg by mouth 2 (two) times daily. 1 in the morning and 1 in the evening  . levofloxacin (LEVAQUIN) 500 MG tablet Take 1 tablet (500 mg total) by mouth daily.  Marland Kitchen lisinopril (PRINIVIL,ZESTRIL) 20 MG tablet Take 1 tablet by mouth daily.  Marland Kitchen ofloxacin (FLOXIN OTIC) 0.3 % OTIC solution Place 10 drops into both ears daily.  Marland Kitchen OVER THE COUNTER MEDICATION Take 3 tablets by mouth daily. cartilast  . oxyCODONE-acetaminophen (PERCOCET) 10-325 MG tablet Take 1 tablet by mouth daily. Will take up to 6 tabs a day  . PRISTIQ 100 MG 24 hr tablet Take 1 tablet by mouth daily.   . promethazine (PHENERGAN) 25 MG tablet Take 1 tablet (25 mg total) by mouth as needed.  Marland Kitchen SYNTHROID 137 MCG tablet Take 137 mcg by mouth daily before breakfast.  . tiZANidine (ZANAFLEX) 4 MG capsule Take 4 mg by mouth 3 (three) times daily as needed for muscle spasms.   No facility-administered encounter medications on file as of 07/08/2018.     Activities of Daily Living No flowsheet data found.  Patient Care Team: Carollee Herter, Alferd Apa, DO as PCP - General Nicholaus Bloom, MD as Consulting Physician (Anesthesiology) Jacelyn Pi, MD as Consulting Physician (Endocrinology) Phylliss Bob, MD as Consulting Physician (Orthopedic Surgery)    Assessment:   This is a routine wellness examination for [name]. Physical assessment deferred to PCP.  Exercise Activities and Dietary recommendations   Diet (meal preparation, eat out, water intake, caffeinated beverages, dairy products, fruits and vegetables): {Desc;  diets:16563} Breakfast: Lunch:  Dinner:      Goals    . Socialize more     Go to pool and go to church.     . to be doing better than this year in general health.        Fall Risk Fall Risk  07/04/2017 07/02/2016 06/28/2015  Falls in  the past year? Yes No No  Number falls in past yr: 1 - -  Follow up Education provided;Falls prevention discussed - -    Depression Screen PHQ 2/9 Scores 07/04/2017 07/02/2016 06/28/2015  PHQ - 2 Score 1 0 0     Cognitive Function MMSE - Mini Mental State Exam 07/02/2016  Orientation to time 5  Orientation to Place 5  Registration 3  Attention/ Calculation 5  Recall 3  Language- name 2 objects 2  Language- repeat 1  Language- follow 3 step command 3  Language- read & follow direction 1  Write a sentence 1  Copy design 1  Total score 30        Immunization History  Administered Date(s) Administered  . Influenza Inj Mdck Quad Pf 04/04/2017  . Influenza Split 04/09/2011, 04/03/2012  . Influenza Whole 03/15/2010  . Influenza,inj,Quad PF,6+ Mos 04/07/2013, 03/26/2014, 03/22/2015, 04/13/2016  . Influenza-Unspecified 04/04/2017, 04/09/2018  . Td 01/23/2007  . Tdap 07/04/2017    Screening Tests Health Maintenance  Topic Date Due  . MAMMOGRAM  03/06/2018  . Hepatitis C Screening  07/05/2023 (Originally 1961/05/01)  . HIV Screening  07/05/2023 (Originally 03/11/1974)  . COLONOSCOPY  12/10/2019  . TETANUS/TDAP  07/05/2027  . INFLUENZA VACCINE  Completed       Plan:   ***   I have personally reviewed and noted the following in the patient's chart:   . Medical and social history . Use of alcohol, tobacco or illicit drugs  . Current medications and supplements . Functional ability and status . Nutritional status . Physical activity . Advanced directives . List of other physicians . Hospitalizations, surgeries, and ER visits in previous 12 months . Vitals . Screenings to include cognitive, depression, and falls . Referrals and  appointments  In addition, I have reviewed and discussed with patient certain preventive protocols, quality metrics, and best practice recommendations. A written personalized care plan for preventive services as well as general preventive health recommendations were provided to patient.     Shela Nevin, [place]  07/07/2018

## 2018-07-08 ENCOUNTER — Ambulatory Visit: Payer: Self-pay | Admitting: *Deleted

## 2018-07-08 ENCOUNTER — Encounter: Payer: Self-pay | Admitting: Family Medicine

## 2018-07-10 ENCOUNTER — Ambulatory Visit: Payer: Medicare Other | Admitting: Psychology

## 2018-07-10 DIAGNOSIS — F331 Major depressive disorder, recurrent, moderate: Secondary | ICD-10-CM | POA: Diagnosis not present

## 2018-07-17 NOTE — Progress Notes (Deleted)
Subjective:   Karen Brown is a 58 y.o. female who presents for Medicare Annual (Subsequent) preventive examination.  Review of Systems: No ROS.  Medicare Wellness Visit. Additional risk factors are reflected in the social history.   Sleep patterns:   Home Safety/Smoke Alarms: Feels safe in home. Smoke alarms in place.     Female:   Pap-  declines     Mammo- active order      Dexa scan-        CCS-last reported 12/09/09 w/ 46yr recall    Objective:     Vitals: There were no vitals taken for this visit.  There is no height or weight on file to calculate BMI.  Advanced Directives 07/04/2017 07/02/2016 06/28/2015  Does Patient Have a Medical Advance Directive? No No Yes  Type of Advance Directive - Public librarian;Living will  Does patient want to make changes to medical advance directive? - - No - Patient declined  Copy of [place] in Chart? - - No - copy requested  Would patient like information on creating a medical advance directive? Yes (MAU/Ambulatory/Procedural Areas - Information given) Yes (MAU/Ambulatory/Procedural Areas - Information given) -    Tobacco Social History   Tobacco Use  Smoking Status Never Smoker  Smokeless Tobacco Never Used     Counseling given: Not Answered   Clinical Intake:                       Past Medical History:  Diagnosis Date  . Anxiety   . Bipolar 1 disorder ([place])   . Chronic migraine   . Chronically dry eyes   . Colitis, ischemic ([place])   . Depression   . Diverticulosis   . Dry mouth   . Fibromyalgia   . GERD (gastroesophageal reflux disease)   . Hemorrhoids   . Hyperlipemia   . Hypertension   . Hypothyroidism   . IBS (irritable bowel syndrome)   . PVC (premature ventricular contraction)    Past Surgical History:  Procedure Laterality Date  . ABDOMINAL HYSTERECTOMY     Partial   . APPENDECTOMY    . BACK SURGERY     Micro diskectomy  . BACK SURGERY     L facet  rhzotomy  . CHOLECYSTECTOMY    . TONSILLECTOMY     Family History  Problem Relation Age of Onset  . Kidney disease Father   . Coronary artery disease Father 67  . COPD Father 10       copd  . Heart disease Father   . Colon polyps Mother   . Osteoporosis Mother   . Colon cancer Neg Hx    Social History   Socioeconomic History  . Marital status: Married    Spouse name: Not on file  . Number of children: 0  . Years of education: Not on file  . Highest education level: Not on file  Occupational History  . Occupation: Disabled     Employer: [employer]  Social Needs  . Financial resource strain: Not on file  . Food insecurity:    Worry: Not on file    Inability: Not on file  . Transportation needs:    Medical: Not on file    Non-medical: Not on file  Tobacco Use  . Smoking status: Never Smoker  . Smokeless tobacco: Never Used  Substance and Sexual Activity  . Alcohol use: No  . Drug use: No  . Sexual  activity: Yes  Lifestyle  . Physical activity:    Days per week: Not on file    Minutes per session: Not on file  . Stress: Not on file  Relationships  . Social connections:    Talks on phone: Not on file    Gets together: Not on file    Attends religious service: Not on file    Active member of club or organization: Not on file    Attends meetings of clubs or organizations: Not on file    Relationship status: Not on file  Other Topics Concern  . Not on file  Social History Narrative   Caffeine occ     Outpatient Encounter Medications as of 07/18/2018  Medication Sig  . azithromycin (ZITHROMAX Z-PAK) 250 MG tablet As directed  . butalbital-acetaminophen-caffeine (FIORICET, ESGIC) 50-325-40 MG tablet TAKE 1 TO 2 TABLETS BY MOUTH EVERY 6 HOURS AS NEEDED FOR HEADACHE  . Cholecalciferol (VITAMIN D-3) 5000 UNITS TABS Take 2 capsules by mouth daily.   . clidinium-chlordiazePOXIDE (LIBRAX) 5-2.5 MG per capsule TAKE ONE CAPSULE BY MOUTH 3 TIMES A DAY BEFORE MEALS AS  NEEDED  . clonazePAM (KLONOPIN) 1 MG tablet Take 1 mg by mouth 3 (three) times daily as needed.    Marland Kitchen estradiol (VIVELLE-DOT) 0.075 MG/24HR Place 1 patch onto the skin once a week.   . fentaNYL (DURAGESIC - DOSED MCG/HR) 75 MCG/HR Place 1 patch onto the skin every other day.    . fluticasone (FLONASE) 50 MCG/ACT nasal spray Place 2 sprays into both nostrils daily.  Marland Kitchen lamoTRIgine (LAMICTAL) 25 MG tablet Take 25 mg by mouth 2 (two) times daily. 1 in the morning and 1 in the evening  . levofloxacin (LEVAQUIN) 500 MG tablet Take 1 tablet (500 mg total) by mouth daily.  Marland Kitchen lisinopril (PRINIVIL,ZESTRIL) 20 MG tablet Take 1 tablet by mouth daily.  Marland Kitchen ofloxacin (FLOXIN OTIC) 0.3 % OTIC solution Place 10 drops into both ears daily.  Marland Kitchen OVER THE COUNTER MEDICATION Take 3 tablets by mouth daily. cartilast  . oxyCODONE-acetaminophen (PERCOCET) 10-325 MG tablet Take 1 tablet by mouth daily. Will take up to 6 tabs a day  . PRISTIQ 100 MG 24 hr tablet Take 1 tablet by mouth daily.   . promethazine (PHENERGAN) 25 MG tablet Take 1 tablet (25 mg total) by mouth as needed.  Marland Kitchen SYNTHROID 137 MCG tablet Take 137 mcg by mouth daily before breakfast.  . tiZANidine (ZANAFLEX) 4 MG capsule Take 4 mg by mouth 3 (three) times daily as needed for muscle spasms.   No facility-administered encounter medications on file as of 07/18/2018.     Activities of Daily Living No flowsheet data found.  Patient Care Team: Carollee Herter, Alferd Apa, DO as PCP - General Nicholaus Bloom, MD as Consulting Physician (Anesthesiology) Jacelyn Pi, MD as Consulting Physician (Endocrinology) Phylliss Bob, MD as Consulting Physician (Orthopedic Surgery)    Assessment:   This is a routine wellness examination for Karen Brown. Physical assessment deferred to PCP.  Exercise Activities and Dietary recommendations   Diet (meal preparation, eat out, water intake, caffeinated beverages, dairy products, fruits and vegetables): {Desc;  diets:[id number]} Breakfast: Lunch:  Dinner:      Goals    . Socialize more     Go to pool and go to church.     . to be doing better than this year in general health.        Fall Risk Fall Risk  07/04/2017 07/02/2016 06/28/2015  Falls in the  past year? Yes No No  Number falls in past yr: 1 - -  Follow up Education provided;Falls prevention discussed - -    Depression Screen PHQ 2/9 Scores 07/04/2017 07/02/2016 06/28/2015  PHQ - 2 Score 1 0 0     Cognitive Function MMSE - Mini Mental State Exam 07/02/2016  Orientation to time 5  Orientation to Place 5  Registration 3  Attention/ Calculation 5  Recall 3  Language- name 2 objects 2  Language- repeat 1  Language- follow 3 step command 3  Language- read & follow direction 1  Write a sentence 1  Copy design 1  Total score 30        Immunization History  Administered Date(s) Administered  . Influenza Inj Mdck Quad Pf 04/04/2017  . Influenza Split 04/09/2011, 04/03/2012  . Influenza Whole 03/15/2010  . Influenza,inj,Quad PF,6+ Mos 04/07/2013, 03/26/2014, 03/22/2015, 04/13/2016  . Influenza-Unspecified 04/04/2017, 04/09/2018  . Td 01/23/2007  . Tdap 07/04/2017   Screening Tests Health Maintenance  Topic Date Due  . MAMMOGRAM  03/06/2018  . Hepatitis C Screening  07/05/2023 (Originally 1960-09-06)  . HIV Screening  07/05/2023 (Originally 03/11/1976)  . COLONOSCOPY  12/10/2019  . TETANUS/TDAP  07/05/2027  . INFLUENZA VACCINE  Completed       Plan:   ***   I have personally reviewed and noted the following in the patient's chart:   . Medical and social history . Use of alcohol, tobacco or illicit drugs  . Current medications and supplements . Functional ability and status . Nutritional status . Physical activity . Advanced directives . List of other physicians . Hospitalizations, surgeries, and ER visits in previous 12 months . Vitals . Screenings to include cognitive, depression, and falls . Referrals and  appointments  In addition, I have reviewed and discussed with patient certain preventive protocols, quality metrics, and best practice recommendations. A written personalized care plan for preventive services as well as general preventive health recommendations were provided to patient.     Naaman Plummer [place], [place]  07/17/2018

## 2018-07-18 ENCOUNTER — Ambulatory Visit: Payer: Self-pay | Admitting: *Deleted

## 2018-07-18 ENCOUNTER — Telehealth: Payer: Self-pay | Admitting: *Deleted

## 2018-07-18 NOTE — Telephone Encounter (Signed)
Copied from [facility] [id number]. Topic: Quick Communication - Appointment Cancellation >> Jul 18, 2018  7:42 AM Lennox Solders wrote: Patient called to cancel appointment scheduled for victoria britt. Patient  HAS [id number]} rescheduled their appointment. Pt has a migraine  Route to department's PEC pool.

## 2018-07-21 DIAGNOSIS — Z79891 Long term (current) use of opiate analgesic: Secondary | ICD-10-CM | POA: Diagnosis not present

## 2018-07-21 DIAGNOSIS — M4726 Other spondylosis with radiculopathy, lumbar region: Secondary | ICD-10-CM | POA: Diagnosis not present

## 2018-07-21 DIAGNOSIS — G894 Chronic pain syndrome: Secondary | ICD-10-CM | POA: Diagnosis not present

## 2018-07-21 DIAGNOSIS — M62838 Other muscle spasm: Secondary | ICD-10-CM | POA: Diagnosis not present

## 2018-07-22 ENCOUNTER — Encounter: Payer: Self-pay | Admitting: Family Medicine

## 2018-07-22 ENCOUNTER — Encounter: Payer: Self-pay | Admitting: *Deleted

## 2018-07-22 ENCOUNTER — Ambulatory Visit (INDEPENDENT_AMBULATORY_CARE_PROVIDER_SITE_OTHER): Payer: Medicare Other | Admitting: *Deleted

## 2018-07-22 ENCOUNTER — Ambulatory Visit (INDEPENDENT_AMBULATORY_CARE_PROVIDER_SITE_OTHER): Payer: Medicare Other | Admitting: Family Medicine

## 2018-07-22 VITALS — BP 120/86 | HR 93 | Temp 98.9°F | Resp 16 | Ht 65.0 in | Wt 218.0 lb

## 2018-07-22 VITALS — BP 120/86 | HR 93 | Ht 65.0 in | Wt 218.0 lb

## 2018-07-22 DIAGNOSIS — G43809 Other migraine, not intractable, without status migrainosus: Secondary | ICD-10-CM

## 2018-07-22 DIAGNOSIS — R110 Nausea: Secondary | ICD-10-CM | POA: Diagnosis not present

## 2018-07-22 DIAGNOSIS — Z0000 Encounter for general adult medical examination without abnormal findings: Secondary | ICD-10-CM | POA: Diagnosis not present

## 2018-07-22 DIAGNOSIS — R300 Dysuria: Secondary | ICD-10-CM | POA: Diagnosis not present

## 2018-07-22 LAB — POC URINALSYSI DIPSTICK (AUTOMATED)
BILIRUBIN UA: NEGATIVE
GLUCOSE UA: NEGATIVE
KETONES UA: NEGATIVE
Nitrite, UA: NEGATIVE
Protein, UA: NEGATIVE
SPEC GRAV UA: 1.01 (ref 1.010–1.025)
Urobilinogen, UA: 0.2 E.U./dL
pH, UA: 6 (ref 5.0–8.0)

## 2018-07-22 MED ORDER — PROMETHAZINE HCL 25 MG PO TABS: 25.0000 mg | ORAL_TABLET | ORAL | 0 refills | Status: DC | PRN

## 2018-07-22 MED ORDER — NITROFURANTOIN MONOHYD MACRO 100 MG PO CAPS: 100.0000 mg | ORAL_CAPSULE | Freq: Two times a day (BID) | ORAL | 0 refills | Status: DC

## 2018-07-22 MED ORDER — BUTALBITAL-APAP-CAFFEINE 50-325-40 MG PO TABS: ORAL_TABLET | ORAL | 0 refills | Status: DC

## 2018-07-22 NOTE — Patient Instructions (Signed)
Urinary Tract Infection, Adult A urinary tract infection (UTI) is an infection of any part of the urinary tract. The urinary tract includes:  The kidneys.  The ureters.  The bladder.  The urethra. These organs make, store, and get rid of pee (urine) in the body. What are the causes? This is caused by germs (bacteria) in your genital area. These germs grow and cause swelling (inflammation) of your urinary tract. What increases the risk? You are more likely to develop this condition if:  You have a small, thin tube (catheter) to drain pee.  You cannot control when you pee or poop (incontinence).  You are female, and: ? You use these methods to prevent pregnancy: ? A medicine that kills sperm (spermicide). ? A device that blocks sperm (diaphragm). ? You have low levels of a female hormone (estrogen). ? You are pregnant.  You have genes that add to your risk.  You are sexually active.  You take antibiotic medicines.  You have trouble peeing because of: ? A prostate that is bigger than normal, if you are female. ? A blockage in the part of your body that drains pee from the bladder (urethra). ? A kidney stone. ? A nerve condition that affects your bladder (neurogenic bladder). ? Not getting enough to drink. ? Not peeing often enough.  You have other conditions, such as: ? Diabetes. ? A weak disease-fighting system (immune system). ? Sickle cell disease. ? Gout. ? Injury of the spine. What are the signs or symptoms? Symptoms of this condition include:  Needing to pee right away (urgently).  Peeing often.  Peeing small amounts often.  Pain or burning when peeing.  Blood in the pee.  Pee that smells bad or not like normal.  Trouble peeing.  Pee that is cloudy.  Fluid coming from the vagina, if you are female.  Pain in the belly or lower back. Other symptoms include:  Throwing up (vomiting).  No urge to eat.  Feeling mixed up (confused).  Being tired  and grouchy (irritable).  A fever.  Watery poop (diarrhea). How is this treated? This condition may be treated with:  Antibiotic medicine.  Other medicines.  Drinking enough water. Follow these instructions at home:  Medicines  Take over-the-counter and prescription medicines only as told by your doctor.  If you were prescribed an antibiotic medicine, take it as told by your doctor. Do not stop taking it even if you start to feel better. General instructions  Make sure you: ? Pee until your bladder is empty. ? Do not hold pee for a long time. ? Empty your bladder after sex. ? Wipe from front to back after pooping if you are a female. Use each tissue one time when you wipe.  Drink enough fluid to keep your pee pale yellow.  Keep all follow-up visits as told by your doctor. This is important. Contact a doctor if:  You do not get better after 1-2 days.  Your symptoms go away and then come back. Get help right away if:  You have very bad back pain.  You have very bad pain in your lower belly.  You have a fever.  You are sick to your stomach (nauseous).  You are throwing up. Summary  A urinary tract infection (UTI) is an infection of any part of the urinary tract.  This condition is caused by germs in your genital area.  There are many risk factors for a UTI. These include having a small, thin   tube to drain pee and not being able to control when you pee or poop.  Treatment includes antibiotic medicines for germs.  Drink enough fluid to keep your pee pale yellow. This information is not intended to replace advice given to you by your health care provider. Make sure you discuss any questions you have with your health care provider. Document Released: 11/14/2007 Document Revised: 12/05/2017 Document Reviewed: 12/05/2017 Elsevier Interactive Patient Education  2019 [employer]  

## 2018-07-22 NOTE — Patient Instructions (Addendum)
PT DECLINES PRINTED AVS    Please schedule your next medicare wellness visit with me in 1 yr.  Continue to eat heart healthy diet (full of fruits, vegetables, whole grains, lean protein, water--limit salt, fat, and sugar intake) and increase physical activity as tolerated.  Continue doing brain stimulating activities (puzzles, reading, adult coloring books, staying active) to keep memory sharp.   Bring a copy of your living will and/or healthcare power of attorney to your next office visit.   Karen Brown , Thank you for taking time to come for your Medicare Wellness Visit. I appreciate your ongoing commitment to your health goals. Please review the following plan we discussed and let me know if I can assist you in the future.   These are the goals we discussed: Goals    . to be doing better than this year in general health.        This is a list of the screening recommended for you and due dates:  Health Maintenance  Topic Date Due  . Mammogram  03/06/2018  .  Hepatitis C: One time screening is recommended by Center for Disease Control  (CDC) for  adults born from [birth year] through 1967.   07/05/2023*  . HIV Screening  07/05/2023*  . Colon Cancer Screening  12/10/2019  . Tetanus Vaccine  07/05/2027  . Flu Shot  Completed  *Topic was postponed. The date shown is not the original due date.    Health Maintenance After Age 81 After age 18, you are at a higher risk for certain long-term diseases and infections as well as injuries from falls. Falls are a major cause of broken bones and head injuries in people who are older than age 63. Getting regular preventive care can help to keep you healthy and well. Preventive care includes getting regular testing and making lifestyle changes as recommended by your health care provider. Talk with your health care provider about:  Which screenings and tests you should have. A screening is a test that checks for a disease when you have no symptoms.  A  diet and exercise plan that is right for you. What should I know about screenings and tests to prevent falls? Screening and testing are the best ways to find a health problem early. Early diagnosis and treatment give you the best chance of managing medical conditions that are common after age 15. Certain conditions and lifestyle choices may make you more likely to have a fall. Your health care provider may recommend:  Regular vision checks. Poor vision and conditions such as cataracts can make you more likely to have a fall. If you wear glasses, make sure to get your prescription updated if your vision changes.  Medicine review. Work with your health care provider to regularly review all of the medicines you are taking, including over-the-counter medicines. Ask your health care provider about any side effects that may make you more likely to have a fall. Tell your health care provider if any medicines that you take make you feel dizzy or sleepy.  Osteoporosis screening. Osteoporosis is a condition that causes the bones to get weaker. This can make the bones weak and cause them to break more easily.  Blood pressure screening. Blood pressure changes and medicines to control blood pressure can make you feel dizzy.  Strength and balance checks. Your health care provider may recommend certain tests to check your strength and balance while standing, walking, or changing positions.  Foot health exam. Foot pain and  numbness, as well as not wearing proper footwear, can make you more likely to have a fall.  Depression screening. You may be more likely to have a fall if you have a fear of falling, feel emotionally low, or feel unable to do activities that you used to do.  Alcohol use screening. Using too much alcohol can affect your balance and may make you more likely to have a fall. What actions can I take to lower my risk of falls? General instructions  Talk with your health care provider about your  risks for falling. Tell your health care provider if: ? You fall. Be sure to tell your health care provider about all falls, even ones that seem minor. ? You feel dizzy, sleepy, or off-balance.  Take over-the-counter and prescription medicines only as told by your health care provider. These include any supplements.  Eat a healthy diet and maintain a healthy weight. A healthy diet includes low-fat dairy products, low-fat (lean) meats, and fiber from whole grains, beans, and lots of fruits and vegetables. Home safety  Remove any tripping hazards, such as rugs, cords, and clutter.  Install safety equipment such as grab bars in bathrooms and safety rails on stairs.  Keep rooms and walkways well-lit. Activity   Follow a regular exercise program to stay fit. This will help you maintain your balance. Ask your health care provider what types of exercise are appropriate for you.  If you need a cane or walker, use it as recommended by your health care provider.  Wear supportive shoes that have nonskid soles. Lifestyle  Do not drink alcohol if your health care provider tells you not to drink.  If you drink alcohol, limit how much you have: ? 0-1 drink a day for women. ? 0-2 drinks a day for men.  Be aware of how much alcohol is in your drink. In the [place] one drink equals one typical bottle of beer (12 oz), one-half glass of wine (5 oz), or one shot of hard liquor (1 oz).  Do not use any products that contain nicotine or tobacco, such as cigarettes and e-cigarettes. If you need help quitting, ask your health care provider. Summary  Having a healthy lifestyle and getting preventive care can help to protect your health and wellness after age 19.  Screening and testing are the best way to find a health problem early and help you avoid having a fall. Early diagnosis and treatment give you the best chance for managing medical conditions that are more common for people who are older than age  23.  Falls are a major cause of broken bones and head injuries in people who are older than age 20. Take precautions to prevent a fall at home.  Work with your health care provider to learn what changes you can make to improve your health and wellness and to prevent falls. This information is not intended to replace advice given to you by your health care provider. Make sure you discuss any questions you have with your health care provider. Document Released: 04/10/2017 Document Revised: 04/10/2017 Document Reviewed: 04/10/2017 Elsevier Interactive Patient Education  2019 [employer].

## 2018-07-22 NOTE — Progress Notes (Addendum)
Subjective:   Karen Brown is a 58 y.o. female who presents for Medicare Annual (Subsequent) preventive examination.  Review of Systems: No ROS.  Medicare Wellness Visit. Additional risk factors are reflected in the social history. Cardiac Risk Factors include: advanced age (>63men, >39 women);hypertension;sedentary lifestyle;obesity (BMI >30kg/m2) Sleep patterns: varies Home Safety/Smoke Alarms: Feels safe in home. Smoke alarms in place.  Lives with husband and dog in 1 story home.  Female:   Pap- declines      Mammo- declines       Dexa scan- declines       CCS- due 12/2019     Objective:     Vitals: BP 120/86 (BP Location: Right Arm, Patient Position: Sitting, Cuff Size: Large)   Pulse 93   Ht 5\' 5"  (1.651 m)   Wt 218 lb (98.9 kg)   SpO2 94%   BMI 36.28 kg/m   Body mass index is 36.28 kg/m.  Advanced Directives 07/22/2018 07/04/2017 07/02/2016 06/28/2015  Does Patient Have a Medical Advance Directive? No No No Yes  Type of Advance Directive - - Public librarian;Living will  Does patient want to make changes to medical advance directive? - - - No - Patient declined  Copy of [place] in Chart? - - - No - copy requested  Would patient like information on creating a medical advance directive? Yes (MAU/Ambulatory/Procedural Areas - Information given) Yes (MAU/Ambulatory/Procedural Areas - Information given) Yes (MAU/Ambulatory/Procedural Areas - Information given) -    Tobacco Social History   Tobacco Use  Smoking Status Never Smoker  Smokeless Tobacco Never Used     Counseling given: Not Answered   Clinical Intake:     Pain : 0-10 Pain Type: Chronic pain Pain Location: Back Pain Descriptors / Indicators: Constant                 Past Medical History:  Diagnosis Date  . Anxiety   . Bipolar 1 disorder ([place])   . Chronic migraine   . Chronically dry eyes   . Colitis, ischemic ([place])   . Depression   .  Diverticulosis   . Dry mouth   . Fibromyalgia   . GERD (gastroesophageal reflux disease)   . Hemorrhoids   . Hyperlipemia   . Hypertension   . Hypothyroidism   . IBS (irritable bowel syndrome)   . PVC (premature ventricular contraction)    Past Surgical History:  Procedure Laterality Date  . ABDOMINAL HYSTERECTOMY     Partial   . APPENDECTOMY    . BACK SURGERY     Micro diskectomy  . BACK SURGERY     L facet rhzotomy  . CHOLECYSTECTOMY    . TONSILLECTOMY     Family History  Problem Relation Age of Onset  . Kidney disease Father   . Coronary artery disease Father 63  . COPD Father 60       copd  . Heart disease Father   . Colon polyps Mother   . Osteoporosis Mother   . Colon cancer Neg Hx    Social History   Socioeconomic History  . Marital status: Married    Spouse name: Not on file  . Number of children: 0  . Years of education: Not on file  . Highest education level: Not on file  Occupational History  . Occupation: Disabled     Employer: [employer]  Social Needs  . Financial resource strain: Not on file  . Food insecurity:  Worry: Not on file    Inability: Not on file  . Transportation needs:    Medical: Not on file    Non-medical: Not on file  Tobacco Use  . Smoking status: Never Smoker  . Smokeless tobacco: Never Used  Substance and Sexual Activity  . Alcohol use: No  . Drug use: No  . Sexual activity: Yes  Lifestyle  . Physical activity:    Days per week: Not on file    Minutes per session: Not on file  . Stress: Not on file  Relationships  . Social connections:    Talks on phone: Not on file    Gets together: Not on file    Attends religious service: Not on file    Active member of club or organization: Not on file    Attends meetings of clubs or organizations: Not on file    Relationship status: Not on file  Other Topics Concern  . Not on file  Social History Narrative   Caffeine occ     Outpatient Encounter Medications as of  07/22/2018  Medication Sig  . butalbital-acetaminophen-caffeine (FIORICET, ESGIC) 50-325-40 MG tablet TAKE 1 TO 2 TABLETS BY MOUTH EVERY 6 HOURS AS NEEDED FOR HEADACHE  . CANNABIDIOL PO Take 0.75 mLs by mouth 2 (two) times daily.  . clidinium-chlordiazePOXIDE (LIBRAX) 5-2.5 MG per capsule TAKE ONE CAPSULE BY MOUTH 3 TIMES A DAY BEFORE MEALS AS NEEDED  . clonazePAM (KLONOPIN) 1 MG tablet Take 1 mg by mouth 3 (three) times daily as needed.    . fentaNYL (DURAGESIC - DOSED MCG/HR) 75 MCG/HR Place 1 patch onto the skin every other day.    . lamoTRIgine (LAMICTAL) 25 MG tablet Take 25 mg by mouth 2 (two) times daily. 1 in the morning and 1 in the evening  . lisinopril (PRINIVIL,ZESTRIL) 20 MG tablet Take 1 tablet by mouth daily.  . nitrofurantoin, macrocrystal-monohydrate, (MACROBID) 100 MG capsule Take 1 capsule (100 mg total) by mouth 2 (two) times daily.  Marland Kitchen OVER THE COUNTER MEDICATION Take 3 tablets by mouth daily. cartilast  . oxyCODONE-acetaminophen (PERCOCET) 10-325 MG tablet Take 1 tablet by mouth daily. Will take up to 6 tabs a day  . PRISTIQ 100 MG 24 hr tablet Take 1 tablet by mouth daily.   . promethazine (PHENERGAN) 25 MG tablet Take 1 tablet (25 mg total) by mouth as needed.  Marland Kitchen SYNTHROID 137 MCG tablet Take 137 mcg by mouth daily before breakfast.  . tiZANidine (ZANAFLEX) 4 MG capsule Take 4 mg by mouth 3 (three) times daily as needed for muscle spasms.   No facility-administered encounter medications on file as of 07/22/2018.     Activities of Daily Living In your present state of health, do you have any difficulty performing the following activities: 07/22/2018  Hearing? N  Vision? N  Comment wearing transitional lenses  Difficulty concentrating or making decisions? N  Walking or climbing stairs? N  Dressing or bathing? N  Doing errands, shopping? N  Preparing Food and eating ? N  Using the Toilet? N  In the past six months, have you accidently leaked urine? N  Do you have  problems with loss of bowel control? N  Managing your Medications? N  Managing your Finances? N  Housekeeping or managing your Housekeeping? N  Some recent data might be hidden    Patient Care Team: Carollee Herter, Alferd Apa, DO as PCP - General Nicholaus Bloom, MD as Consulting Physician (Anesthesiology) Jacelyn Pi, MD as Consulting  Physician (Endocrinology) Phylliss Bob, MD as Consulting Physician (Orthopedic Surgery)    Assessment:   This is a routine wellness examination for Karen Brown. Physical assessment deferred to PCP.  Exercise Activities and Dietary recommendations Current Exercise Habits: The patient does not participate in regular exercise at present, Exercise limited by: orthopedic condition(s)(back pain) Diet (meal preparation, eat out, water intake, caffeinated beverages, dairy products, fruits and vegetables): in general, an "unhealthy" diet, on average, 2 meals per day   Goals    . to be doing better than this year in general health.        Fall Risk Fall Risk  07/22/2018 07/04/2017 07/02/2016 06/28/2015  Falls in the past year? 0 Yes No No  Number falls in past yr: - 1 - -  Follow up - Education provided;Falls prevention discussed - -    Depression Screen PHQ 2/9 Scores 07/22/2018 07/04/2017 07/02/2016 06/28/2015  PHQ - 2 Score 1 1 0 0     Cognitive Function Ad8 score reviewed for issues:  Issues making decisions:no  Less interest in hobbies / activities:no  Repeats questions, stories (family complaining):no  Trouble using ordinary gadgets (microwave, computer, phone):no  Forgets the month or year: no  Mismanaging finances: no  Remembering appts:no  Daily problems with thinking and/or memory:no Ad8 score is=0     MMSE - Mini Mental State Exam 07/02/2016  Orientation to time 5  Orientation to Place 5  Registration 3  Attention/ Calculation 5  Recall 3  Language- name 2 objects 2  Language- repeat 1  Language- follow 3 step command 3    Language- read & follow direction 1  Write a sentence 1  Copy design 1  Total score 30        Immunization History  Administered Date(s) Administered  . Influenza Inj Mdck Quad Pf 04/04/2017  . Influenza Split 04/09/2011, 04/03/2012  . Influenza Whole 03/15/2010  . Influenza,inj,Quad PF,6+ Mos 04/07/2013, 03/26/2014, 03/22/2015, 04/13/2016  . Influenza-Unspecified 04/04/2017, 04/09/2018  . Td 01/23/2007  . Tdap 07/04/2017   Screening Tests Health Maintenance  Topic Date Due  . MAMMOGRAM  03/06/2018  . Hepatitis C Screening  07/05/2023 (Originally 01-Jul-1960)  . HIV Screening  07/05/2023 (Originally 03/11/1974)  . COLONOSCOPY  12/10/2019  . TETANUS/TDAP  07/05/2027  . INFLUENZA VACCINE  Completed        Plan:    Please schedule your next medicare wellness visit with me in 1 yr.  Continue to eat heart healthy diet (full of fruits, vegetables, whole grains, lean protein, water--limit salt, fat, and sugar intake) and increase physical activity as tolerated.  Continue doing brain stimulating activities (puzzles, reading, adult coloring books, staying active) to keep memory sharp.   Bring a copy of your living will and/or healthcare power of attorney to your next office visit.   I have personally reviewed and noted the following in the patient's chart:   . Medical and social history . Use of alcohol, tobacco or illicit drugs  . Current medications and supplements . Functional ability and status . Nutritional status . Physical activity . Advanced directives . List of other physicians . Hospitalizations, surgeries, and ER visits in previous 12 months . Vitals . Screenings to include cognitive, depression, and falls . Referrals and appointments  In addition, I have reviewed and discussed with patient certain preventive protocols, quality metrics, and best practice recommendations. A written personalized care plan for preventive services as well as general preventive  health recommendations were provided to patient.  Shela Nevin, RN  07/22/2018   Reviewed   Ann Held, DO

## 2018-07-22 NOTE — Progress Notes (Signed)
Patient ID: Karen Brown, female    DOB: Feb 26, 1961  Age: 58 y.o. MRN: [id number]    Subjective:  Subjective  HPI Karen Brown presents for dysuria ,  Frequency and flank pain  She also needs refills on her headache meds  Review of Systems  Constitutional: Negative for appetite change, diaphoresis, fatigue and unexpected weight change.  Eyes: Negative for pain, redness and visual disturbance.  Respiratory: Negative for cough, chest tightness, shortness of breath and wheezing.   Cardiovascular: Negative for chest pain, palpitations and leg swelling.  Endocrine: Negative for cold intolerance, heat intolerance, polydipsia, polyphagia and polyuria.  Genitourinary: Negative for difficulty urinating, dysuria and frequency.  Musculoskeletal: Positive for arthralgias and back pain.  Neurological: Negative for dizziness, light-headedness, numbness and headaches.    History Past Medical History:  Diagnosis Date  . Anxiety   . Bipolar 1 disorder ([place])   . Chronic migraine   . Chronically dry eyes   . Colitis, ischemic ([place])   . Depression   . Diverticulosis   . Dry mouth   . Fibromyalgia   . GERD (gastroesophageal reflux disease)   . Hemorrhoids   . Hyperlipemia   . Hypertension   . Hypothyroidism   . IBS (irritable bowel syndrome)   . PVC (premature ventricular contraction)     She has a past surgical history that includes Cholecystectomy; Abdominal hysterectomy; Tonsillectomy; Back surgery; Back surgery; and Appendectomy.   Her family history includes COPD (age of onset: 47) in her father; Colon polyps in her mother; Coronary artery disease (age of onset: 80) in her father; Heart disease in her father; Kidney disease in her father; Osteoporosis in her mother.She reports that she has never smoked. She has never used smokeless tobacco. She reports that she does not drink alcohol or use drugs.  Current Outpatient Medications on File Prior to Visit  Medication Sig Dispense Refill    . CANNABIDIOL PO Take 0.75 mLs by mouth 2 (two) times daily.    . clidinium-chlordiazePOXIDE (LIBRAX) 5-2.5 MG per capsule TAKE ONE CAPSULE BY MOUTH 3 TIMES A DAY BEFORE MEALS AS NEEDED 90 capsule 2  . clonazePAM (KLONOPIN) 1 MG tablet Take 1 mg by mouth 3 (three) times daily as needed.      . fentaNYL (DURAGESIC - DOSED MCG/HR) 75 MCG/HR Place 1 patch onto the skin every other day.      . lamoTRIgine (LAMICTAL) 25 MG tablet Take 25 mg by mouth 2 (two) times daily. 1 in the morning and 1 in the evening    . lisinopril (PRINIVIL,ZESTRIL) 20 MG tablet Take 1 tablet by mouth daily.    Marland Kitchen OVER THE COUNTER MEDICATION Take 3 tablets by mouth daily. cartilast    . oxyCODONE-acetaminophen (PERCOCET) 10-325 MG tablet Take 1 tablet by mouth daily. Will take up to 6 tabs a day  0  . PRISTIQ 100 MG 24 hr tablet Take 1 tablet by mouth daily.     Marland Kitchen SYNTHROID 137 MCG tablet Take 137 mcg by mouth daily before breakfast.    . tiZANidine (ZANAFLEX) 4 MG capsule Take 4 mg by mouth 3 (three) times daily as needed for muscle spasms.     No current facility-administered medications on file prior to visit.      Objective:  Objective  Physical Exam Vitals signs and nursing note reviewed.  Constitutional:      Appearance: She is well-developed.  HENT:     Head: Normocephalic and atraumatic.  Eyes:  Conjunctiva/sclera: Conjunctivae normal.  Neck:     Musculoskeletal: Normal range of motion and neck supple.     Thyroid: No thyromegaly.     Vascular: No carotid bruit or JVD.  Cardiovascular:     Rate and Rhythm: Normal rate and regular rhythm.     Heart sounds: Normal heart sounds. No murmur.  Pulmonary:     Effort: Pulmonary effort is normal. No respiratory distress.     Breath sounds: Normal breath sounds. No wheezing or rales.  Chest:     Chest wall: No tenderness.  Neurological:     Mental Status: She is alert and oriented to person, place, and time.    BP 120/86 (BP Location: Right Arm, Cuff  Size: Large)   Pulse 93   Temp 98.9 F (37.2 C) (Oral)   Resp 16   Ht 5\' 5"  (1.651 m)   Wt 218 lb (98.9 kg)   SpO2 94%   BMI 36.28 kg/m  Wt Readings from Last 3 Encounters:  07/22/18 218 lb (98.9 kg)  07/22/18 218 lb (98.9 kg)  11/01/17 211 lb (95.7 kg)     Lab Results  Component Value Date   WBC 7.5 07/06/2016   HGB 15.2 (H) 07/06/2016   HCT 45.1 07/06/2016   PLT 279.0 07/06/2016   GLUCOSE 94 07/04/2016   CHOL 319 (H) 07/04/2016   TRIG 205.0 (H) 07/04/2016   HDL 58.60 07/04/2016   LDLDIRECT 217.0 07/04/2016   LDLCALC 190 (H) 06/27/2015   ALT 21 07/04/2016   AST 19 07/04/2016   NA 142 07/04/2016   K 4.0 07/04/2016   CL 102 07/04/2016   CREATININE 0.80 07/04/2016   BUN 11 07/04/2016   CO2 32 07/04/2016   TSH 0.82 02/10/2010   HGBA1C 5.4 08/15/2007    Mm Screening Breast Tomo Bilateral  Result Date: 03/07/2017 CLINICAL DATA:  Screening. EXAM: 2D DIGITAL SCREENING BILATERAL MAMMOGRAM WITH CAD AND ADJUNCT TOMO COMPARISON:  Previous exam(s). ACR Breast Density Category b: There are scattered areas of fibroglandular density. FINDINGS: There are no findings suspicious for malignancy. Images were processed with CAD. IMPRESSION: No mammographic evidence of malignancy. A result letter of this screening mammogram will be mailed directly to the patient. RECOMMENDATION: Screening mammogram in one year. (Code:SM-B-01Y) BI-RADS CATEGORY  1: Negative. Electronically Signed   By: Abelardo Diesel M.D.   On: 03/07/2017 10:32     Assessment & Plan:  Plan  I have discontinued Karen Brown's Vitamin D-3, estradiol, azithromycin, fluticasone, levofloxacin, and ofloxacin. I am also having her start on nitrofurantoin (macrocrystal-monohydrate). Additionally, I am having her maintain her lamoTRIgine, PRISTIQ, clonazePAM, fentaNYL, tiZANidine, clidinium-chlordiazePOXIDE, SYNTHROID, lisinopril, OVER THE COUNTER MEDICATION, oxyCODONE-acetaminophen, CANNABIDIOL PO, promethazine, and  butalbital-acetaminophen-caffeine.  Meds ordered this encounter  Medications  . nitrofurantoin, macrocrystal-monohydrate, (MACROBID) 100 MG capsule    Sig: Take 1 capsule (100 mg total) by mouth 2 (two) times daily.    Dispense:  14 capsule    Refill:  0  . promethazine (PHENERGAN) 25 MG tablet    Sig: Take 1 tablet (25 mg total) by mouth as needed.    Dispense:  90 tablet    Refill:  0  . butalbital-acetaminophen-caffeine (FIORICET, ESGIC) 50-325-40 MG tablet    Sig: TAKE 1 TO 2 TABLETS BY MOUTH EVERY 6 HOURS AS NEEDED FOR HEADACHE    Dispense:  20 tablet    Refill:  0    Please consider 90 day supplies to promote better adherence    Problem List  Items Addressed This Visit    None    Visit Diagnoses    Dysuria    -  Primary   Relevant Medications   nitrofurantoin, macrocrystal-monohydrate, (MACROBID) 100 MG capsule   Other Relevant Orders   POCT Urinalysis Dipstick (Automated) (Completed)   Urine Culture   Nausea without vomiting       Relevant Medications   promethazine (PHENERGAN) 25 MG tablet   Other migraine without status migrainosus, not intractable       Relevant Medications   CANNABIDIOL PO   butalbital-acetaminophen-caffeine (FIORICET, ESGIC) 50-325-40 MG tablet      Follow-up: Return in about 3 months (around 10/20/2018), or if symptoms worsen or fail to improve, for annual exam, fasting.  Ann Held, DO

## 2018-07-23 ENCOUNTER — Telehealth: Payer: Self-pay

## 2018-07-23 NOTE — Telephone Encounter (Signed)
Copied from [place]. Topic: General - Other >> Jul 23, 2018 11:40 AM Yvette Rack wrote: Reason for CRM: Pt requests a call back from [place]. Cb# [phone]

## 2018-07-23 NOTE — Telephone Encounter (Signed)
Left voice mail

## 2018-07-24 ENCOUNTER — Encounter: Payer: Self-pay | Admitting: Family Medicine

## 2018-07-24 LAB — URINE CULTURE
MICRO NUMBER:: 179717
SPECIMEN QUALITY:: ADEQUATE

## 2018-08-07 ENCOUNTER — Ambulatory Visit: Payer: Medicare Other | Admitting: Psychology

## 2018-08-19 DIAGNOSIS — M4726 Other spondylosis with radiculopathy, lumbar region: Secondary | ICD-10-CM | POA: Diagnosis not present

## 2018-08-19 DIAGNOSIS — M62838 Other muscle spasm: Secondary | ICD-10-CM | POA: Diagnosis not present

## 2018-08-19 DIAGNOSIS — G894 Chronic pain syndrome: Secondary | ICD-10-CM | POA: Diagnosis not present

## 2018-08-19 DIAGNOSIS — Z79891 Long term (current) use of opiate analgesic: Secondary | ICD-10-CM | POA: Diagnosis not present

## 2018-08-27 ENCOUNTER — Ambulatory Visit: Payer: Medicare Other | Admitting: Psychology

## 2018-09-15 ENCOUNTER — Encounter: Payer: Self-pay | Admitting: Family Medicine

## 2018-09-18 DIAGNOSIS — M62838 Other muscle spasm: Secondary | ICD-10-CM | POA: Diagnosis not present

## 2018-09-18 DIAGNOSIS — M4726 Other spondylosis with radiculopathy, lumbar region: Secondary | ICD-10-CM | POA: Diagnosis not present

## 2018-09-18 DIAGNOSIS — G894 Chronic pain syndrome: Secondary | ICD-10-CM | POA: Diagnosis not present

## 2018-09-18 DIAGNOSIS — Z79891 Long term (current) use of opiate analgesic: Secondary | ICD-10-CM | POA: Diagnosis not present

## 2018-09-24 ENCOUNTER — Ambulatory Visit (INDEPENDENT_AMBULATORY_CARE_PROVIDER_SITE_OTHER): Payer: Medicare Other | Admitting: Psychology

## 2018-09-24 DIAGNOSIS — F331 Major depressive disorder, recurrent, moderate: Secondary | ICD-10-CM

## 2018-10-16 DIAGNOSIS — M4726 Other spondylosis with radiculopathy, lumbar region: Secondary | ICD-10-CM | POA: Diagnosis not present

## 2018-10-16 DIAGNOSIS — Z79891 Long term (current) use of opiate analgesic: Secondary | ICD-10-CM | POA: Diagnosis not present

## 2018-10-16 DIAGNOSIS — G894 Chronic pain syndrome: Secondary | ICD-10-CM | POA: Diagnosis not present

## 2018-10-16 DIAGNOSIS — M62838 Other muscle spasm: Secondary | ICD-10-CM | POA: Diagnosis not present

## 2018-10-17 ENCOUNTER — Encounter: Payer: Self-pay | Admitting: Family Medicine

## 2018-10-22 ENCOUNTER — Ambulatory Visit (INDEPENDENT_AMBULATORY_CARE_PROVIDER_SITE_OTHER): Payer: Medicare Other | Admitting: Psychology

## 2018-10-22 DIAGNOSIS — F331 Major depressive disorder, recurrent, moderate: Secondary | ICD-10-CM | POA: Diagnosis not present

## 2018-10-23 ENCOUNTER — Other Ambulatory Visit: Payer: Self-pay | Admitting: Family Medicine

## 2018-10-23 DIAGNOSIS — G43809 Other migraine, not intractable, without status migrainosus: Secondary | ICD-10-CM

## 2018-10-24 NOTE — Telephone Encounter (Signed)
Requesting: Fioricet Contract: N/A UDS: N/A Last OV: 07/22/2018 Next OV: 12/23/2018 Last Refill: 07/22/2018, #20--0 RF Database:   Please advise

## 2018-11-12 DIAGNOSIS — M62838 Other muscle spasm: Secondary | ICD-10-CM | POA: Diagnosis not present

## 2018-11-12 DIAGNOSIS — Z79891 Long term (current) use of opiate analgesic: Secondary | ICD-10-CM | POA: Diagnosis not present

## 2018-11-12 DIAGNOSIS — G894 Chronic pain syndrome: Secondary | ICD-10-CM | POA: Diagnosis not present

## 2018-11-12 DIAGNOSIS — M4726 Other spondylosis with radiculopathy, lumbar region: Secondary | ICD-10-CM | POA: Diagnosis not present

## 2018-11-19 ENCOUNTER — Ambulatory Visit (INDEPENDENT_AMBULATORY_CARE_PROVIDER_SITE_OTHER): Payer: Medicare Other | Admitting: Psychology

## 2018-11-19 DIAGNOSIS — F331 Major depressive disorder, recurrent, moderate: Secondary | ICD-10-CM

## 2018-11-20 DIAGNOSIS — E039 Hypothyroidism, unspecified: Secondary | ICD-10-CM | POA: Diagnosis not present

## 2018-11-20 DIAGNOSIS — I10 Essential (primary) hypertension: Secondary | ICD-10-CM | POA: Diagnosis not present

## 2018-11-20 DIAGNOSIS — E559 Vitamin D deficiency, unspecified: Secondary | ICD-10-CM | POA: Diagnosis not present

## 2018-11-20 DIAGNOSIS — R7301 Impaired fasting glucose: Secondary | ICD-10-CM | POA: Diagnosis not present

## 2018-12-17 ENCOUNTER — Ambulatory Visit: Payer: Medicare Other | Admitting: Psychology

## 2018-12-23 ENCOUNTER — Encounter: Payer: Medicare Other | Admitting: Family Medicine

## 2018-12-24 ENCOUNTER — Ambulatory Visit (INDEPENDENT_AMBULATORY_CARE_PROVIDER_SITE_OTHER): Payer: Medicare Other | Admitting: Psychology

## 2018-12-24 DIAGNOSIS — F331 Major depressive disorder, recurrent, moderate: Secondary | ICD-10-CM | POA: Diagnosis not present

## 2019-01-08 DIAGNOSIS — G894 Chronic pain syndrome: Secondary | ICD-10-CM | POA: Diagnosis not present

## 2019-01-08 DIAGNOSIS — Z79891 Long term (current) use of opiate analgesic: Secondary | ICD-10-CM | POA: Diagnosis not present

## 2019-01-08 DIAGNOSIS — M4726 Other spondylosis with radiculopathy, lumbar region: Secondary | ICD-10-CM | POA: Diagnosis not present

## 2019-01-08 DIAGNOSIS — M62838 Other muscle spasm: Secondary | ICD-10-CM | POA: Diagnosis not present

## 2019-01-20 ENCOUNTER — Ambulatory Visit (INDEPENDENT_AMBULATORY_CARE_PROVIDER_SITE_OTHER): Payer: Medicare Other | Admitting: Psychology

## 2019-01-20 DIAGNOSIS — F331 Major depressive disorder, recurrent, moderate: Secondary | ICD-10-CM | POA: Diagnosis not present

## 2019-02-17 ENCOUNTER — Other Ambulatory Visit: Payer: Self-pay

## 2019-02-17 ENCOUNTER — Encounter: Payer: Self-pay | Admitting: Family Medicine

## 2019-02-17 ENCOUNTER — Ambulatory Visit (INDEPENDENT_AMBULATORY_CARE_PROVIDER_SITE_OTHER): Payer: Medicare Other | Admitting: Family Medicine

## 2019-02-17 VITALS — BP 132/90 | HR 90 | Temp 97.5°F | Resp 18 | Ht 65.0 in | Wt 217.6 lb

## 2019-02-17 DIAGNOSIS — R109 Unspecified abdominal pain: Secondary | ICD-10-CM | POA: Diagnosis not present

## 2019-02-17 DIAGNOSIS — G43809 Other migraine, not intractable, without status migrainosus: Secondary | ICD-10-CM

## 2019-02-17 DIAGNOSIS — R10A1 Flank pain, right side: Secondary | ICD-10-CM | POA: Insufficient documentation

## 2019-02-17 DIAGNOSIS — M5441 Lumbago with sciatica, right side: Secondary | ICD-10-CM

## 2019-02-17 DIAGNOSIS — M5442 Lumbago with sciatica, left side: Secondary | ICD-10-CM

## 2019-02-17 LAB — POC URINALSYSI DIPSTICK (AUTOMATED)
Bilirubin, UA: NEGATIVE
Blood, UA: NEGATIVE
Glucose, UA: NEGATIVE
Ketones, UA: NEGATIVE
Leukocytes, UA: NEGATIVE
Nitrite, UA: NEGATIVE
Protein, UA: NEGATIVE
Spec Grav, UA: 1.02 (ref 1.010–1.025)
Urobilinogen, UA: 0.2 E.U./dL
pH, UA: 5.5 (ref 5.0–8.0)

## 2019-02-17 MED ORDER — BUTALBITAL-APAP-CAFFEINE 50-325-40 MG PO TABS: ORAL_TABLET | ORAL | 0 refills | Status: DC

## 2019-02-17 NOTE — Assessment & Plan Note (Signed)
Pt has pain meds at home  And muscle relaxer

## 2019-02-17 NOTE — Assessment & Plan Note (Signed)
Strain urine ? Kidney stone Check Ct Pt has meds at home for pain

## 2019-02-17 NOTE — Patient Instructions (Signed)
Renal Colic  Renal colic is pain that is caused by passing a kidney stone. The pain can be sharp and severe. It may be felt in the back, abdomen, side (flank), or groin. It can cause nausea. Renal colic can come and go. Follow these instructions at home: Watch your condition for any changes. The following actions may help to lessen any discomfort that you are feeling: Medicines  Take over-the-counter and prescription medicines only as told by your health care provider.  Do not drive or use heavy machinery while taking prescription pain medicine. Eating and drinking   Drink enough fluid to keep your urine pale yellow. You may be instructed to drink at least 8-10 glasses of water each day. Follow instructions from your health care provider.  If directed, change your diet. This may include: ? Limiting how much sodium you eat. You may need to eat less than 2 grams (2,000 mg) per day. ? Eating more fruits and vegetables. ? Limiting how much animal protein, such as red meat, poultry, fish, and eggs, you eat. ? Avoiding foods such as spinach, rhubarb, sweet potatoes, and nuts. These make kidney stones more likely to form.  Follow instructions from your health care provider about eating or drinking restrictions. General instructions  Keep all follow-up visits as told by your health care provider. This is important.  Collect urine samples as told by your health care provider. You may need to collect a urine sample: ? 24 hours after you pass the stone. ? 8-12 weeks after passing the kidney stone, and every 6-12 months after that.  Strain your urine every time you urinate, for as long as directed. Use the strainer that your health care provider recommends.  Do not throw out the kidney stone after passing it. Keep the stone so it can be tested by your health care provider. Testing the makeup of your kidney stone may help understand how to prevent you from getting kidney stones in the future.  Contact a health care provider if:  You have a fever or chills.  Your urine smells bad or looks cloudy.  You have pain or burning when you pass urine. Get help right away if:  Your flank pain or groin pain suddenly worsens.  You become confused or disoriented or you lose consciousness. Summary  Renal colic is pain that is caused by passing a kidney stone.  Take over-the-counter and prescription medicines only as told by your health care provider.  Drink enough fluid to keep your urine pale yellow. You may be instructed to drink at least 8-10 glasses of water each day. Follow instructions from your health care provider.  Strain your urine every time you urinate, for as long as directed. Use the strainer that your health care provider recommends.  Do not throw out the kidney stone after passing it. Keep the stone so it can be tested by your health care provider. This information is not intended to replace advice given to you by your health care provider. Make sure you discuss any questions you have with your health care provider. Document Released: 03/07/2005 Document Revised: 06/25/2017 Document Reviewed: 06/25/2017 Elsevier Patient Education  2020 [employer].

## 2019-02-17 NOTE — Progress Notes (Signed)
Patient ID: [name], female    DOB: 05/19/1961  Age: 58 y.o. MRN: [id number]    Subjective:  Subjective  HPI Karen Brown presents for low back pain.  She fell in March and started having severe back pain higher up.  Now its lower  --- R flank pain -- pain eases up and then catches her quick    No blood in her urine   Review of Systems  Constitutional: Negative for appetite change, diaphoresis, fatigue and unexpected weight change.  Eyes: Negative for pain, redness and visual disturbance.  Respiratory: Negative for cough, chest tightness, shortness of breath and wheezing.   Cardiovascular: Negative for chest pain, palpitations and leg swelling.  Endocrine: Negative for cold intolerance, heat intolerance, polydipsia, polyphagia and polyuria.  Genitourinary: Positive for flank pain. Negative for difficulty urinating, dysuria, frequency and hematuria.  Neurological: Negative for dizziness, light-headedness, numbness and headaches.    History Past Medical History:  Diagnosis Date  . Anxiety   . Bipolar 1 disorder ([place])   . Chronic migraine   . Chronically dry eyes   . Colitis, ischemic ([place])   . Depression   . Diverticulosis   . Dry mouth   . Fibromyalgia   . GERD (gastroesophageal reflux disease)   . Hemorrhoids   . Hyperlipemia   . Hypertension   . Hypothyroidism   . IBS (irritable bowel syndrome)   . PVC (premature ventricular contraction)     She has a past surgical history that includes Cholecystectomy; Abdominal hysterectomy; Tonsillectomy; Back surgery; Back surgery; and Appendectomy.   Her family history includes COPD (age of onset: 93) in her father; Colon polyps in her mother; Coronary artery disease (age of onset: 55) in her father; Heart disease in her father; Kidney disease in her father; Osteoporosis in her mother.She reports that she has never smoked. She has never used smokeless tobacco. She reports that she does not drink alcohol or use drugs.  Current  Outpatient Medications on File Prior to Visit  Medication Sig Dispense Refill  . CANNABIDIOL PO Take 0.75 mLs by mouth 2 (two) times daily.    . clidinium-chlordiazePOXIDE (LIBRAX) 5-2.5 MG per capsule TAKE ONE CAPSULE BY MOUTH 3 TIMES A DAY BEFORE MEALS AS NEEDED 90 capsule 2  . clonazePAM (KLONOPIN) 1 MG tablet Take 1 mg by mouth 3 (three) times daily as needed.      . fentaNYL (DURAGESIC - DOSED MCG/HR) 75 MCG/HR Place 1 patch onto the skin every other day.      . lamoTRIgine (LAMICTAL) 25 MG tablet Take 25 mg by mouth 2 (two) times daily. 1 in the morning and 1 in the evening    . lisinopril (PRINIVIL,ZESTRIL) 20 MG tablet Take 1 tablet by mouth daily.    Marland Kitchen OVER THE COUNTER MEDICATION Take 3 tablets by mouth daily. cartilast    . oxyCODONE-acetaminophen (PERCOCET) 10-325 MG tablet Take 1 tablet by mouth daily. Will take up to 6 tabs a day  0  . PRISTIQ 100 MG 24 hr tablet Take 1 tablet by mouth daily.     . promethazine (PHENERGAN) 25 MG tablet Take 1 tablet (25 mg total) by mouth as needed. 90 tablet 0  . SYNTHROID 137 MCG tablet Take 137 mcg by mouth daily before breakfast.    . tiZANidine (ZANAFLEX) 4 MG capsule Take 4 mg by mouth 3 (three) times daily as needed for muscle spasms.    . nitrofurantoin, macrocrystal-monohydrate, (MACROBID) 100 MG capsule Take 1 capsule (  100 mg total) by mouth 2 (two) times daily. (Patient not taking: Reported on 02/17/2019) 14 capsule 0   No current facility-administered medications on file prior to visit.      Objective:  Objective  Physical Exam Vitals signs and nursing note reviewed.  Constitutional:      Appearance: She is well-developed.  HENT:     Head: Normocephalic and atraumatic.  Eyes:     Conjunctiva/sclera: Conjunctivae normal.  Neck:     Musculoskeletal: Normal range of motion and neck supple.     Thyroid: No thyromegaly.     Vascular: No carotid bruit or JVD.  Cardiovascular:     Rate and Rhythm: Normal rate and regular rhythm.      Heart sounds: Normal heart sounds. No murmur.  Pulmonary:     Effort: Pulmonary effort is normal. No respiratory distress.     Breath sounds: Normal breath sounds. No wheezing or rales.  Chest:     Chest wall: No tenderness.  Neurological:     Mental Status: She is alert and oriented to person, place, and time.    BP 132/90 (BP Location: Right Arm, Patient Position: Sitting, Cuff Size: Normal)   Pulse 90   Temp (!) 97.5 F (36.4 C) (Temporal)   Resp 18   Ht 5\' 5"  (1.651 m)   Wt 217 lb 9.6 oz (98.7 kg)   SpO2 98%   BMI 36.21 kg/m  Wt Readings from Last 3 Encounters:  02/17/19 217 lb 9.6 oz (98.7 kg)  07/22/18 218 lb (98.9 kg)  07/22/18 218 lb (98.9 kg)     Lab Results  Component Value Date   WBC 7.5 07/06/2016   HGB 15.2 (H) 07/06/2016   HCT 45.1 07/06/2016   PLT 279.0 07/06/2016   GLUCOSE 94 07/04/2016   CHOL 319 (H) 07/04/2016   TRIG 205.0 (H) 07/04/2016   HDL 58.60 07/04/2016   LDLDIRECT 217.0 07/04/2016   LDLCALC 190 (H) 06/27/2015   ALT 21 07/04/2016   AST 19 07/04/2016   NA 142 07/04/2016   K 4.0 07/04/2016   CL 102 07/04/2016   CREATININE 0.80 07/04/2016   BUN 11 07/04/2016   CO2 32 07/04/2016   TSH 0.82 02/10/2010   HGBA1C 5.4 08/15/2007    Mm Screening Breast Tomo Bilateral  Result Date: 03/07/2017 CLINICAL DATA:  Screening. EXAM: 2D DIGITAL SCREENING BILATERAL MAMMOGRAM WITH CAD AND ADJUNCT TOMO COMPARISON:  Previous exam(s). ACR Breast Density Category b: There are scattered areas of fibroglandular density. FINDINGS: There are no findings suspicious for malignancy. Images were processed with CAD. IMPRESSION: No mammographic evidence of malignancy. A result letter of this screening mammogram will be mailed directly to the patient. RECOMMENDATION: Screening mammogram in one year. (Code:SM-B-01Y) BI-RADS CATEGORY  1: Negative. Electronically Signed   By: Abelardo Diesel M.D.   On: 03/07/2017 10:32     Assessment & Plan:  Plan  I have changed [name]'s butalbital-acetaminophen-caffeine. I am also having her maintain her lamoTRIgine, Pristiq, clonazePAM, fentaNYL, tiZANidine, clidinium-chlordiazePOXIDE, Synthroid, lisinopril, OVER THE COUNTER MEDICATION, oxyCODONE-acetaminophen, CANNABIDIOL PO, nitrofurantoin (macrocrystal-monohydrate), and promethazine.  Meds ordered this encounter  Medications  . butalbital-acetaminophen-caffeine (FIORICET) 50-325-40 MG tablet    Sig: 1 po q4h prn    Dispense:  20 tablet    Refill:  0    Problem List Items Addressed This Visit      Unprioritized   BACK PAIN, LUMBAR - Primary    Pt has pain meds at home  And  muscle relaxer       Relevant Medications   butalbital-acetaminophen-caffeine (FIORICET) 50-325-40 MG tablet   Other Relevant Orders   POCT Urinalysis Dipstick (Automated) (Completed)   Right flank pain    Strain urine ? Kidney stone Check Ct Pt has meds at home for pain      Relevant Orders   Urine Culture    Other Visit Diagnoses    Other migraine without status migrainosus, not intractable       Relevant Medications   butalbital-acetaminophen-caffeine (FIORICET) 50-325-40 MG tablet   Abdominal pain, unspecified abdominal location       Relevant Orders   CT Abdomen Pelvis Wo Contrast   Urine Culture    pt in office > 25 min with >50% face to face discussing her flank pain and history of kidney stones and infections   Follow-up: Return if symptoms worsen or fail to improve. Ann Held, DO

## 2019-02-18 DIAGNOSIS — R109 Unspecified abdominal pain: Secondary | ICD-10-CM | POA: Diagnosis not present

## 2019-02-19 ENCOUNTER — Other Ambulatory Visit: Payer: Self-pay

## 2019-02-19 ENCOUNTER — Encounter: Payer: Self-pay | Admitting: Family Medicine

## 2019-02-19 ENCOUNTER — Ambulatory Visit (HOSPITAL_BASED_OUTPATIENT_CLINIC_OR_DEPARTMENT_OTHER)
Admission: RE | Admit: 2019-02-19 | Discharge: 2019-02-19 | Disposition: A | Discharge status: Home / Self Care | Payer: Medicare Other | Source: Ambulatory Visit | Attending: Family Medicine | Admitting: Family Medicine

## 2019-02-19 DIAGNOSIS — R109 Unspecified abdominal pain: Secondary | ICD-10-CM | POA: Insufficient documentation

## 2019-02-19 DIAGNOSIS — N200 Calculus of kidney: Secondary | ICD-10-CM | POA: Diagnosis not present

## 2019-02-19 NOTE — Telephone Encounter (Signed)
NO RESULTS YET

## 2019-02-20 ENCOUNTER — Other Ambulatory Visit: Payer: Self-pay

## 2019-02-20 ENCOUNTER — Ambulatory Visit (HOSPITAL_BASED_OUTPATIENT_CLINIC_OR_DEPARTMENT_OTHER)
Admission: RE | Admit: 2019-02-20 | Discharge: 2019-02-20 | Disposition: A | Discharge status: Home / Self Care | Payer: Medicare Other | Source: Ambulatory Visit | Attending: Family Medicine | Admitting: Family Medicine

## 2019-02-20 ENCOUNTER — Encounter: Payer: Self-pay | Admitting: Family Medicine

## 2019-02-20 DIAGNOSIS — M5442 Lumbago with sciatica, left side: Secondary | ICD-10-CM | POA: Insufficient documentation

## 2019-02-20 DIAGNOSIS — M5441 Lumbago with sciatica, right side: Secondary | ICD-10-CM | POA: Diagnosis not present

## 2019-02-20 DIAGNOSIS — M545 Low back pain: Secondary | ICD-10-CM | POA: Diagnosis not present

## 2019-02-20 LAB — URINE CULTURE
MICRO NUMBER:: 860845
SPECIMEN QUALITY:: ADEQUATE

## 2019-02-20 NOTE — Telephone Encounter (Signed)
Pt sent 3 messages. I replied in the other thread

## 2019-02-20 NOTE — Telephone Encounter (Signed)
If it is low back --- we can xray LS spine  There was a stone L side but it was nonobstructing--- but if its moving it could cause pain

## 2019-02-20 NOTE — Telephone Encounter (Signed)
The order is in--- you can get it today --- just walk into radiology I may not have the results right away--- if the pain is severe--- there is a walk in ortho clinic at [facility] after 530 I think

## 2019-02-20 NOTE — Telephone Encounter (Signed)
I did answer this -- please look at previous messages

## 2019-02-20 NOTE — Telephone Encounter (Signed)
Pt notified of xray orders.

## 2019-02-23 ENCOUNTER — Other Ambulatory Visit: Payer: Self-pay | Admitting: Family Medicine

## 2019-02-23 ENCOUNTER — Telehealth: Payer: Self-pay | Admitting: *Deleted

## 2019-02-23 ENCOUNTER — Encounter: Payer: Self-pay | Admitting: Family Medicine

## 2019-02-23 DIAGNOSIS — N200 Calculus of kidney: Secondary | ICD-10-CM

## 2019-02-23 NOTE — Telephone Encounter (Signed)
Notes recorded by Ann Held, DO on 02/23/2019 at 11:10 AM EDT  12 mm kidney stone on the left---- refer to urology  I put referral in

## 2019-02-23 NOTE — Telephone Encounter (Signed)
Yes there was arthritis Going back to ortho would be the next step and letting pain management know

## 2019-02-23 NOTE — Telephone Encounter (Signed)
He has a 29mm kidney stone I have put a referral in for urology and have gwen working on it

## 2019-02-23 NOTE — Telephone Encounter (Signed)
Results given to patient and she states that she is having pain on the right side.  Did you see arthritis?  Also father has a history of spondylosis.  If arthritis is seen what would be next step.

## 2019-02-24 ENCOUNTER — Other Ambulatory Visit: Payer: Self-pay | Admitting: Family Medicine

## 2019-02-24 ENCOUNTER — Encounter: Payer: Self-pay | Admitting: Family Medicine

## 2019-02-24 DIAGNOSIS — G8929 Other chronic pain: Secondary | ICD-10-CM

## 2019-02-24 NOTE — Telephone Encounter (Signed)
Do you think spine and scoliosis on [facility] will be a good place to go or go to an orthopedic?

## 2019-02-24 NOTE — Telephone Encounter (Signed)
I put a referral in for ortho --- spine and scoliosis

## 2019-02-24 NOTE — Telephone Encounter (Signed)
I put referral in for spine and scoliosis on [facility]

## 2019-02-25 ENCOUNTER — Ambulatory Visit (INDEPENDENT_AMBULATORY_CARE_PROVIDER_SITE_OTHER): Payer: Medicare Other | Admitting: Psychology

## 2019-02-25 ENCOUNTER — Other Ambulatory Visit: Payer: Self-pay

## 2019-02-25 DIAGNOSIS — F331 Major depressive disorder, recurrent, moderate: Secondary | ICD-10-CM | POA: Diagnosis not present

## 2019-02-25 NOTE — Telephone Encounter (Signed)
Patient notified via mychart

## 2019-02-26 ENCOUNTER — Encounter: Payer: Self-pay | Admitting: Family Medicine

## 2019-02-26 ENCOUNTER — Ambulatory Visit (INDEPENDENT_AMBULATORY_CARE_PROVIDER_SITE_OTHER): Payer: Medicare Other | Admitting: Family Medicine

## 2019-02-26 VITALS — BP 146/88 | HR 102 | Temp 98.0°F | Resp 18 | Ht 65.0 in | Wt 221.2 lb

## 2019-02-26 DIAGNOSIS — N200 Calculus of kidney: Secondary | ICD-10-CM

## 2019-02-26 DIAGNOSIS — E039 Hypothyroidism, unspecified: Secondary | ICD-10-CM

## 2019-02-26 DIAGNOSIS — Z23 Encounter for immunization: Secondary | ICD-10-CM | POA: Diagnosis not present

## 2019-02-26 DIAGNOSIS — Z0000 Encounter for general adult medical examination without abnormal findings: Secondary | ICD-10-CM | POA: Diagnosis not present

## 2019-02-26 DIAGNOSIS — I10 Essential (primary) hypertension: Secondary | ICD-10-CM

## 2019-02-26 LAB — POC URINALSYSI DIPSTICK (AUTOMATED)
Bilirubin, UA: NEGATIVE
Blood, UA: NEGATIVE
Glucose, UA: NEGATIVE
Ketones, UA: NEGATIVE
Leukocytes, UA: NEGATIVE
Nitrite, UA: NEGATIVE
Protein, UA: NEGATIVE
Spec Grav, UA: 1.01 (ref 1.010–1.025)
Urobilinogen, UA: 0.2 E.U./dL
pH, UA: 6 (ref 5.0–8.0)

## 2019-02-26 NOTE — Patient Instructions (Addendum)

## 2019-02-26 NOTE — Progress Notes (Signed)
Subjective:     Karen Brown is a 58 y.o. female and is here for a comprehensive physical exam. The patient reports no new problems . She is also f/u on bp and med refills Social History   Socioeconomic History  . Marital status: Married    Spouse name: Not on file  . Number of children: 0  . Years of education: Not on file  . Highest education level: Not on file  Occupational History  . Occupation: Disabled     Employer: [employer]  Social Needs  . Financial resource strain: Not on file  . Food insecurity    Worry: Not on file    Inability: Not on file  . Transportation needs    Medical: Not on file    Non-medical: Not on file  Tobacco Use  . Smoking status: Never Smoker  . Smokeless tobacco: Never Used  Substance and Sexual Activity  . Alcohol use: No  . Drug use: No  . Sexual activity: Yes  Lifestyle  . Physical activity    Days per week: Not on file    Minutes per session: Not on file  . Stress: Not on file  Relationships  . Social Herbalist on phone: Not on file    Gets together: Not on file    Attends religious service: Not on file    Active member of club or organization: Not on file    Attends meetings of clubs or organizations: Not on file    Relationship status: Not on file  . Intimate partner violence    Fear of current or ex partner: Not on file    Emotionally abused: Not on file    Physically abused: Not on file    Forced sexual activity: Not on file  Other Topics Concern  . Not on file  Social History Narrative   Caffeine occ    Health Maintenance  Topic Date Due  . MAMMOGRAM  03/06/2018  . INFLUENZA VACCINE  01/10/2019  . Hepatitis C Screening  07/05/2023 (Originally 1960/09/08)  . HIV Screening  07/05/2023 (Originally 03/[birth year]/1974)  . COLONOSCOPY  12/10/2019  . TETANUS/TDAP  07/05/2027    The following portions of the patient's history were reviewed and updated as appropriate:  She  has a past medical history of Anxiety, Bipolar  1 disorder ([place]), Chronic migraine, Chronically dry eyes, Colitis, ischemic ([place]), Depression, Diverticulosis, Dry mouth, Fibromyalgia, GERD (gastroesophageal reflux disease), Hemorrhoids, Hyperlipemia, Hypertension, Hypothyroidism, IBS (irritable bowel syndrome), and PVC (premature ventricular contraction). She does not have any pertinent problems on file. She  has a past surgical history that includes Cholecystectomy; Abdominal hysterectomy; Tonsillectomy; Back surgery; Back surgery; and Appendectomy. Her family history includes COPD (age of onset: 48) in her father; Colon polyps in her mother; Coronary artery disease (age of onset: 70) in her father; Heart disease in her father; Kidney disease in her father; Osteoporosis in her mother. She  reports that she has never smoked. She has never used smokeless tobacco. She reports that she does not drink alcohol or use drugs. She has a current medication list which includes the following prescription(s): butalbital-acetaminophen-caffeine, cannabidiol, clidinium-chlordiazepoxide, clonazepam, fentanyl, lamotrigine, lisinopril, nitrofurantoin (macrocrystal-monohydrate), OVER THE COUNTER MEDICATION, oxycodone-acetaminophen, pristiq, promethazine, synthroid, and tizanidine. Current Outpatient Medications on File Prior to Visit  Medication Sig Dispense Refill  . butalbital-acetaminophen-caffeine (FIORICET) 50-325-40 MG tablet 1 po q4h prn 20 tablet 0  . CANNABIDIOL PO Take 0.75 mLs by mouth 2 (two) times daily.    Marland Kitchen  clidinium-chlordiazePOXIDE (LIBRAX) 5-2.5 MG per capsule TAKE ONE CAPSULE BY MOUTH 3 TIMES A DAY BEFORE MEALS AS NEEDED 90 capsule 2  . clonazePAM (KLONOPIN) 1 MG tablet Take 1 mg by mouth 3 (three) times daily as needed.      . fentaNYL (DURAGESIC - DOSED MCG/HR) 75 MCG/HR Place 1 patch onto the skin every other day.      . lamoTRIgine (LAMICTAL) 25 MG tablet Take 25 mg by mouth 2 (two) times daily. 1 in the morning and 1 in the evening    .  lisinopril (PRINIVIL,ZESTRIL) 20 MG tablet Take 1 tablet by mouth daily.    . nitrofurantoin, macrocrystal-monohydrate, (MACROBID) 100 MG capsule Take 1 capsule (100 mg total) by mouth 2 (two) times daily. 14 capsule 0  . OVER THE COUNTER MEDICATION Take 3 tablets by mouth daily. cartilast    . oxyCODONE-acetaminophen (PERCOCET) 10-325 MG tablet Take 1 tablet by mouth daily. Will take up to 6 tabs a day  0  . PRISTIQ 100 MG 24 hr tablet Take 1 tablet by mouth daily.     . promethazine (PHENERGAN) 25 MG tablet Take 1 tablet (25 mg total) by mouth as needed. 90 tablet 0  . SYNTHROID 137 MCG tablet Take 137 mcg by mouth daily before breakfast.    . tiZANidine (ZANAFLEX) 4 MG capsule Take 4 mg by mouth 3 (three) times daily as needed for muscle spasms.     No current facility-administered medications on file prior to visit.    She is allergic to biaxin [clarithromycin]; clindamycin/lincomycin; ketorolac tromethamine; pb-hyoscy-atropine-scopolamine; penicillins; phenobarbital; sulfonamide derivatives; topiramate; and zaleplon..  Review of Systems Review of Systems  Constitutional: Negative for activity change, appetite change and fatigue.  HENT: Negative for hearing loss, congestion, tinnitus and ear discharge.  dentist q21m Eyes: Negative for visual disturbance (see optho q1y -- vision corrected to 20/20 with glasses).  Respiratory: Negative for cough, chest tightness and shortness of breath.   Cardiovascular: Negative for chest pain, palpitations and leg swelling.  Gastrointestinal: Negative for abdominal pain, diarrhea, constipation and abdominal distention.  Genitourinary: Negative for urgency, frequency, decreased urine volume and difficulty urinating.  Musculoskeletal: Negative for back pain, arthralgias and gait problem.  Skin: Negative for color change, pallor and rash.  Neurological: Negative for dizziness, light-headedness, numbness and headaches.  Hematological: Negative for adenopathy.  Does not bruise/bleed easily.  Psychiatric/Behavioral: Negative for suicidal ideas, confusion, sleep disturbance, self-injury, dysphoric mood, decreased concentration and agitation.       Objective:    BP (!) 146/88 (BP Location: Right Arm, Patient Position: Sitting, Cuff Size: Large)   Pulse (!) 102   Temp 98 F (36.7 C) (Temporal)   Resp 18   Ht 5\' 5"  (1.651 m)   Wt 221 lb 3.2 oz (100.3 kg)   SpO2 97%   BMI 36.81 kg/m  General appearance: alert, cooperative, appears stated age and mild distress Head: Normocephalic, without obvious abnormality, atraumatic Eyes: conjunctivae/corneas clear. PERRL, EOM's intact. Fundi benign. Ears: normal TM's and external ear canals both ears Nose: Nares normal. Septum midline. Mucosa normal. No drainage or sinus tenderness. Throat: lips, mucosa, and tongue normal; teeth and gums normal Neck: no adenopathy, no carotid bruit, no JVD, supple, symmetrical, trachea midline and thyroid not enlarged, symmetric, no tenderness/mass/nodules Back: severe-- back pain-----ortho next week Lungs: clear to auscultation bilaterally Breasts: normal appearance, no masses or tenderness Heart: regular rate and rhythm, S1, S2 normal, no murmur, click, rub or gallop Abdomen: soft, non-tender; bowel sounds normal;  no masses,  no organomegaly Pelvic: deferred Extremities: extremities normal, atraumatic, no cyanosis or edema Pulses: 2+ and symmetric Skin: Skin color, texture, turgor normal. No rashes or lesions Lymph nodes: Cervical, supraclavicular, and axillary nodes normal. Neurologic: Alert and oriented X 3, normal strength and tone. Normal symmetric reflexes. Normal coordination and gait    Assessment:    Healthy female exam.  Plan:    ghm --- mammo/ pap overdue  Check labs See After Visit Summary for Counseling Recommendations    1. Hypothyroidism, unspecified type Check labs con't meds  - TSH  2. Kidney stone Urology pending  - POCT Urinalysis  Dipstick (Automated)  3. Essential hypertension Well controlled, no changes to meds. Encouraged heart healthy diet such as the DASH diet and exercise as tolerated.   - Lipid panel - CBC with Differential/Platelet - Comprehensive metabolic panel - Microalbumin / creatinine urine ratio - POCT Urinalysis Dipstick (Automated)  4. Need for influenza vaccination \ - Flu Vaccine QUAD 36+ mos IM  5. Hypercalcemia   - PTH, intact (no Ca)

## 2019-02-27 LAB — CBC WITH DIFFERENTIAL/PLATELET
Basophils Absolute: 0.1 10*3/uL (ref 0.0–0.1)
Basophils Relative: 0.6 % (ref 0.0–3.0)
Eosinophils Absolute: 0.2 10*3/uL (ref 0.0–0.7)
Eosinophils Relative: 2.4 % (ref 0.0–5.0)
HCT: 45 % (ref 36.0–46.0)
Hemoglobin: 14.7 g/dL (ref 12.0–15.0)
Lymphocytes Relative: 21.7 % (ref 12.0–46.0)
Lymphs Abs: 2.2 10*3/uL (ref 0.7–4.0)
MCHC: 32.7 g/dL (ref 30.0–36.0)
MCV: 87.5 fl (ref 78.0–100.0)
Monocytes Absolute: 0.5 10*3/uL (ref 0.1–1.0)
Monocytes Relative: 5.2 % (ref 3.0–12.0)
Neutro Abs: 7 10*3/uL (ref 1.4–7.7)
Neutrophils Relative %: 70.1 % (ref 43.0–77.0)
Platelets: 340 10*3/uL (ref 150.0–400.0)
RBC: 5.14 Mil/uL — ABNORMAL HIGH (ref 3.87–5.11)
RDW: 13.8 % (ref 11.5–15.5)
WBC: 10 10*3/uL (ref 4.0–10.5)

## 2019-02-27 LAB — COMPREHENSIVE METABOLIC PANEL
ALT: 25 U/L (ref 0–35)
AST: 23 U/L (ref 0–37)
Albumin: 4.9 g/dL (ref 3.5–5.2)
Alkaline Phosphatase: 132 U/L — ABNORMAL HIGH (ref 39–117)
BUN: 12 mg/dL (ref 6–23)
CO2: 30 mEq/L (ref 19–32)
Calcium: 10.3 mg/dL (ref 8.4–10.5)
Chloride: 101 mEq/L (ref 96–112)
Creatinine, Ser: 0.9 mg/dL (ref 0.40–1.20)
GFR: 63.88 mL/min (ref >60.00)
Glucose, Bld: 101 mg/dL — ABNORMAL HIGH (ref 70–99)
Potassium: 4.2 mEq/L (ref 3.5–5.1)
Sodium: 143 mEq/L (ref 135–145)
Total Bilirubin: 0.3 mg/dL (ref 0.2–1.2)
Total Protein: 7.8 g/dL (ref 6.0–8.3)

## 2019-02-27 LAB — LIPID PANEL
Cholesterol: 327 mg/dL — ABNORMAL HIGH (ref 0–200)
HDL: 52.1 mg/dL (ref >39.00)
Total CHOL/HDL Ratio: 6
Triglycerides: 517 mg/dL — ABNORMAL HIGH (ref 0.0–149.0)

## 2019-02-27 LAB — MICROALBUMIN / CREATININE URINE RATIO
Creatinine,U: 24.8 mg/dL
Microalb Creat Ratio: 2.8 mg/g (ref 0.0–30.0)
Microalb, Ur: <0.7 mg/dL (ref 0.0–1.9)

## 2019-02-27 LAB — PARATHYROID HORMONE, INTACT (NO CA): PTH: 40 pg/mL (ref 14–64)

## 2019-02-27 LAB — TSH: TSH: 4.67 u[IU]/mL — ABNORMAL HIGH (ref 0.35–4.50)

## 2019-02-27 LAB — LDL CHOLESTEROL, DIRECT: Direct LDL: 210 mg/dL

## 2019-03-03 DIAGNOSIS — M47816 Spondylosis without myelopathy or radiculopathy, lumbar region: Secondary | ICD-10-CM | POA: Diagnosis not present

## 2019-03-03 DIAGNOSIS — N200 Calculus of kidney: Secondary | ICD-10-CM | POA: Diagnosis not present

## 2019-03-03 DIAGNOSIS — M961 Postlaminectomy syndrome, not elsewhere classified: Secondary | ICD-10-CM | POA: Diagnosis not present

## 2019-03-03 DIAGNOSIS — M5136 Other intervertebral disc degeneration, lumbar region: Secondary | ICD-10-CM | POA: Diagnosis not present

## 2019-03-05 DIAGNOSIS — M62838 Other muscle spasm: Secondary | ICD-10-CM | POA: Diagnosis not present

## 2019-03-05 DIAGNOSIS — G894 Chronic pain syndrome: Secondary | ICD-10-CM | POA: Diagnosis not present

## 2019-03-05 DIAGNOSIS — Z79891 Long term (current) use of opiate analgesic: Secondary | ICD-10-CM | POA: Diagnosis not present

## 2019-03-05 DIAGNOSIS — M4726 Other spondylosis with radiculopathy, lumbar region: Secondary | ICD-10-CM | POA: Diagnosis not present

## 2019-03-06 ENCOUNTER — Encounter: Payer: Self-pay | Admitting: Family Medicine

## 2019-03-06 ENCOUNTER — Other Ambulatory Visit: Payer: Self-pay

## 2019-03-06 ENCOUNTER — Other Ambulatory Visit: Payer: Self-pay | Admitting: Family Medicine

## 2019-03-06 DIAGNOSIS — E785 Hyperlipidemia, unspecified: Secondary | ICD-10-CM

## 2019-03-06 DIAGNOSIS — E039 Hypothyroidism, unspecified: Secondary | ICD-10-CM

## 2019-03-06 MED ORDER — ROSUVASTATIN CALCIUM 10 MG PO TABS: 10.0000 mg | ORAL_TABLET | Freq: Every day | ORAL | 2 refills | Status: DC

## 2019-03-06 MED ORDER — LEVOTHYROXINE SODIUM 150 MCG PO TABS: 150.0000 ug | ORAL_TABLET | Freq: Every day | ORAL | 2 refills | Status: DC

## 2019-03-06 NOTE — Telephone Encounter (Signed)
She just needs to get the labs to Dr Chalmers Cater so she can see it or we can fax them if she would like [place] too

## 2019-03-16 ENCOUNTER — Encounter (HOSPITAL_COMMUNITY): Payer: Self-pay | Admitting: *Deleted

## 2019-03-16 ENCOUNTER — Other Ambulatory Visit: Payer: Self-pay | Admitting: Urology

## 2019-03-17 ENCOUNTER — Other Ambulatory Visit (HOSPITAL_COMMUNITY)
Admission: RE | Admit: 2019-03-17 | Discharge: 2019-03-17 | Disposition: A | Discharge status: Home / Self Care | Payer: Medicare Other | Source: Ambulatory Visit | Attending: Urology | Admitting: Urology

## 2019-03-17 DIAGNOSIS — Z20828 Contact with and (suspected) exposure to other viral communicable diseases: Secondary | ICD-10-CM | POA: Diagnosis not present

## 2019-03-17 DIAGNOSIS — Z01812 Encounter for preprocedural laboratory examination: Secondary | ICD-10-CM | POA: Diagnosis not present

## 2019-03-17 LAB — SARS CORONAVIRUS 2 (TAT 6-24 HRS): SARS Coronavirus 2: NEGATIVE

## 2019-03-19 ENCOUNTER — Other Ambulatory Visit: Payer: Self-pay

## 2019-03-19 ENCOUNTER — Encounter (HOSPITAL_COMMUNITY): Payer: Self-pay | Admitting: Anesthesiology

## 2019-03-19 ENCOUNTER — Ambulatory Visit (HOSPITAL_COMMUNITY): Payer: Medicare Other | Admitting: Anesthesiology

## 2019-03-19 ENCOUNTER — Ambulatory Visit (HOSPITAL_COMMUNITY)
Admission: RE | Admit: 2019-03-19 | Discharge: 2019-03-19 | Disposition: A | Discharge status: Home / Self Care | Payer: Medicare Other | Source: Other Acute Inpatient Hospital | Attending: Urology | Admitting: Urology

## 2019-03-19 ENCOUNTER — Encounter (HOSPITAL_COMMUNITY)
Admission: RE | Disposition: A | Discharge status: Home / Self Care | Payer: Self-pay | Source: Other Acute Inpatient Hospital | Attending: Urology

## 2019-03-19 ENCOUNTER — Ambulatory Visit (HOSPITAL_COMMUNITY): Payer: Medicare Other

## 2019-03-19 DIAGNOSIS — E039 Hypothyroidism, unspecified: Secondary | ICD-10-CM | POA: Diagnosis not present

## 2019-03-19 DIAGNOSIS — Z01818 Encounter for other preprocedural examination: Secondary | ICD-10-CM | POA: Diagnosis not present

## 2019-03-19 DIAGNOSIS — N200 Calculus of kidney: Secondary | ICD-10-CM | POA: Insufficient documentation

## 2019-03-19 DIAGNOSIS — I10 Essential (primary) hypertension: Secondary | ICD-10-CM | POA: Diagnosis not present

## 2019-03-19 HISTORY — PX: EXTRACORPOREAL SHOCK WAVE LITHOTRIPSY: SHX1557

## 2019-03-19 HISTORY — DX: Family history of other specified conditions: Z84.89

## 2019-03-19 HISTORY — DX: Other complications of anesthesia, initial encounter: T88.59XA

## 2019-03-19 SURGERY — LITHOTRIPSY, ESWL
Anesthesia: Monitor Anesthesia Care | Laterality: Left

## 2019-03-19 MED ORDER — OXYCODONE HCL 5 MG/5ML PO SOLN: 5.0000 mg | Freq: Once | ORAL | Status: AC | PRN

## 2019-03-19 MED ORDER — OXYCODONE HCL 5 MG PO TABS
5.0000 mg | ORAL_TABLET | Freq: Once | ORAL | Status: AC | PRN
  Administered 2019-03-19 (×2): 5 mg via ORAL

## 2019-03-19 MED ORDER — MIDAZOLAM HCL 2 MG/2ML IJ SOLN
INTRAMUSCULAR | Status: AC
  Filled 2019-03-19: qty 2

## 2019-03-19 MED ORDER — SODIUM CHLORIDE 0.9 % IV SOLN
INTRAVENOUS | Status: DC
  Administered 2019-03-19: 08:00:00 via INTRAVENOUS

## 2019-03-19 MED ORDER — PROPOFOL 500 MG/50ML IV EMUL
INTRAVENOUS | Status: AC
  Filled 2019-03-19: qty 50

## 2019-03-19 MED ORDER — DIPHENHYDRAMINE HCL 25 MG PO CAPS: 25.0000 mg | ORAL_CAPSULE | ORAL | Status: DC

## 2019-03-19 MED ORDER — OXYCODONE HCL 5 MG PO TABS: 5.0000 mg | ORAL_TABLET | Freq: Once | ORAL | Status: DC

## 2019-03-19 MED ORDER — FENTANYL CITRATE (PF) 100 MCG/2ML IJ SOLN
INTRAMUSCULAR | Status: DC | PRN
  Administered 2019-03-19 (×2): 50 ug via INTRAVENOUS
  Administered 2019-03-19 (×2): 25 ug via INTRAVENOUS
  Administered 2019-03-19: 50 ug via INTRAVENOUS

## 2019-03-19 MED ORDER — ACETAMINOPHEN 160 MG/5ML PO SOLN: 325.0000 mg | ORAL | Status: DC | PRN

## 2019-03-19 MED ORDER — DIAZEPAM 5 MG PO TABS: 10.0000 mg | ORAL_TABLET | ORAL | Status: DC

## 2019-03-19 MED ORDER — CIPROFLOXACIN HCL 500 MG PO TABS
500.0000 mg | ORAL_TABLET | ORAL | Status: AC
  Administered 2019-03-19: 500 mg via ORAL
  Filled 2019-03-19: qty 1

## 2019-03-19 MED ORDER — OXYCODONE HCL 5 MG PO TABS
ORAL_TABLET | ORAL | Status: AC
  Filled 2019-03-19: qty 1

## 2019-03-19 MED ORDER — FENTANYL CITRATE (PF) 100 MCG/2ML IJ SOLN
INTRAMUSCULAR | Status: AC
  Filled 2019-03-19: qty 2

## 2019-03-19 MED ORDER — ACETAMINOPHEN 10 MG/ML IV SOLN: 1000.0000 mg | Freq: Once | INTRAVENOUS | Status: DC | PRN

## 2019-03-19 MED ORDER — OXYCODONE HCL 5 MG PO TABS
ORAL_TABLET | ORAL | Status: AC
  Administered 2019-03-19: 5 mg via ORAL
  Filled 2019-03-19: qty 1

## 2019-03-19 MED ORDER — FENTANYL CITRATE (PF) 100 MCG/2ML IJ SOLN: 25.0000 ug | INTRAMUSCULAR | Status: DC | PRN

## 2019-03-19 MED ORDER — ONDANSETRON HCL 4 MG/2ML IJ SOLN
INTRAMUSCULAR | Status: DC | PRN
  Administered 2019-03-19: 4 mg via INTRAVENOUS

## 2019-03-19 MED ORDER — PROPOFOL 500 MG/50ML IV EMUL
INTRAVENOUS | Status: DC | PRN
  Administered 2019-03-19: 125 ug/kg/min via INTRAVENOUS

## 2019-03-19 MED ORDER — ACETAMINOPHEN 325 MG PO TABS
ORAL_TABLET | ORAL | Status: AC
  Filled 2019-03-19: qty 1

## 2019-03-19 MED ORDER — PROPOFOL 10 MG/ML IV BOLUS
INTRAVENOUS | Status: AC
  Filled 2019-03-19: qty 20

## 2019-03-19 MED ORDER — ACETAMINOPHEN 325 MG PO TABS
325.0000 mg | ORAL_TABLET | ORAL | Status: DC | PRN
  Administered 2019-03-19: 11:00:00 325 mg via ORAL

## 2019-03-19 MED ORDER — DEXAMETHASONE SODIUM PHOSPHATE 10 MG/ML IJ SOLN: 8.0000 mg | Freq: Once | INTRAMUSCULAR | Status: DC | PRN

## 2019-03-19 MED ORDER — PROPOFOL 10 MG/ML IV BOLUS
INTRAVENOUS | Status: DC | PRN
  Administered 2019-03-19 (×4): 20 mg via INTRAVENOUS
  Administered 2019-03-19: 10 mg via INTRAVENOUS
  Administered 2019-03-19: 20 mg via INTRAVENOUS
  Administered 2019-03-19: 10 mg via INTRAVENOUS

## 2019-03-19 MED ORDER — MEPERIDINE HCL 50 MG/ML IJ SOLN: 6.2500 mg | INTRAMUSCULAR | Status: DC | PRN

## 2019-03-19 MED ORDER — DEXAMETHASONE SODIUM PHOSPHATE 10 MG/ML IJ SOLN
INTRAMUSCULAR | Status: DC | PRN
  Administered 2019-03-19: 10 mg via INTRAVENOUS

## 2019-03-19 NOTE — Anesthesia Preprocedure Evaluation (Signed)
Anesthesia Evaluation  Patient identified by MRN, date of birth, ID band Patient awake    Reviewed: Allergy & Precautions, NPO status , Patient's Chart, lab work & pertinent test results  Airway Mallampati: I       Dental no notable dental hx. (+) Teeth Intact   Pulmonary neg pulmonary ROS,    Pulmonary exam normal breath sounds clear to auscultation       Cardiovascular hypertension, Pt. on medications Normal cardiovascular exam Rhythm:Regular Rate:Normal     Neuro/Psych PSYCHIATRIC DISORDERS Anxiety Depression Bipolar Disorder    GI/Hepatic Neg liver ROS,   Endo/Other  Hypothyroidism   Renal/GU negative Renal ROS     Musculoskeletal   Abdominal Normal abdominal exam  (+)   Peds  Hematology negative hematology ROS (+)   Anesthesia Other Findings   Reproductive/Obstetrics                             Anesthesia Physical Anesthesia Plan  ASA: II  Anesthesia Plan: MAC   Post-op Pain Management:    Induction:   PONV Risk Score and Plan: 2 and Ondansetron  Airway Management Planned: Nasal Cannula, Natural Airway and Simple Face Mask  Additional Equipment: None  Intra-op Plan:   Post-operative Plan:   Informed Consent: I have reviewed the patients History and Physical, chart, labs and discussed the procedure including the risks, benefits and alternatives for the proposed anesthesia with the patient or authorized representative who has indicated his/her understanding and acceptance.       Plan Discussed with: CRNA  Anesthesia Plan Comments:         Anesthesia Quick Evaluation

## 2019-03-19 NOTE — Transfer of Care (Signed)
Immediate Anesthesia Transfer of Care Note  Patient: Karen Brown  Procedure(s) Performed: EXTRACORPOREAL SHOCK WAVE LITHOTRIPSY (ESWL) (Left )  Patient Location: PACU  Anesthesia Type:MAC  Level of Consciousness: awake, alert  and oriented  Airway & Oxygen Therapy: Patient Spontanous Breathing  Post-op Assessment: Report given to RN, Post -op Vital signs reviewed and stable and patient complaining of pain.  Post vital signs: Reviewed and stable  Last Vitals:  Vitals Value Taken Time  BP    Temp    Pulse    Resp    SpO2      Last Pain:  Vitals:   03/19/19 0744  TempSrc:   PainSc: 2       Patients Stated Pain Goal: 1 (73/54/30 1484)  Complications: No apparent anesthesia complications

## 2019-03-19 NOTE — Discharge Instructions (Signed)
Dietary Guidelines to Help Prevent Kidney Stones Kidney stones are deposits of minerals and salts that form inside your kidneys. Your risk of developing kidney stones may be greater depending on your diet, your lifestyle, the medicines you take, and whether you have certain medical conditions. Most people can reduce their chances of developing kidney stones by following the instructions below. Depending on your overall health and the type of kidney stones you tend to develop, your dietitian may give you more specific instructions. What are tips for following this plan? Reading food labels  Choose foods with "no salt added" or "low-salt" labels. Limit your sodium intake to less than 1500 mg per day.  Choose foods with calcium for each meal and snack. Try to eat about 300 mg of calcium at each meal. Foods that contain 200-500 mg of calcium per serving include: ? 8 oz (237 ml) of milk, fortified nondairy milk, and fortified fruit juice. ? 8 oz (237 ml) of kefir, yogurt, and soy yogurt. ? 4 oz (118 ml) of tofu. ? 1 oz of cheese. ? 1 cup (300 g) of dried figs. ? 1 cup (91 g) of cooked broccoli. ? 1-3 oz can of sardines or mackerel.  Most people need 1000 to 1500 mg of calcium each day. Talk to your dietitian about how much calcium is recommended for you. Shopping  Buy plenty of fresh fruits and vegetables. Most people do not need to avoid fruits and vegetables, even if they contain nutrients that may contribute to kidney stones.  When shopping for convenience foods, choose: ? Whole pieces of fruit. ? Premade salads with dressing on the side. ? Low-fat fruit and yogurt smoothies.  Avoid buying frozen meals or prepared deli foods.  Look for foods with live cultures, such as yogurt and kefir. Cooking  Do not add salt to food when cooking. Place a salt shaker on the table and allow each person to add his or her own salt to taste.  Use vegetable protein, such as beans, textured vegetable  protein (TVP), or tofu instead of meat in pasta, casseroles, and soups. Meal planning   Eat less salt, if told by your dietitian. To do this: ? Avoid eating processed or premade food. ? Avoid eating fast food.  Eat less animal protein, including cheese, meat, poultry, or fish, if told by your dietitian. To do this: ? Limit the number of times you have meat, poultry, fish, or cheese each week. Eat a diet free of meat at least 2 days a week. ? Eat only one serving each day of meat, poultry, fish, or seafood. ? When you prepare animal protein, cut pieces into small portion sizes. For most meat and fish, one serving is about the size of one deck of cards.  Eat at least 5 servings of fresh fruits and vegetables each day. To do this: ? Keep fruits and vegetables on hand for snacks. ? Eat 1 piece of fruit or a handful of berries with breakfast. ? Have a salad and fruit at lunch. ? Have two kinds of vegetables at dinner.  Limit foods that are high in a substance called oxalate. These include: ? Spinach. ? Rhubarb. ? Beets. ? Potato chips and french fries. ? Nuts.  If you regularly take a diuretic medicine, make sure to eat at least 1-2 fruits or vegetables high in potassium each day. These include: ? Avocado. ? Banana. ? Orange, prune, carrot, or tomato juice. ? Baked potato. ? Cabbage. ? Beans and split   peas. General instructions   Drink enough fluid to keep your urine clear or pale yellow. This is the most important thing you can do.  Talk to your health care provider and dietitian about taking daily supplements. Depending on your health and the cause of your kidney stones, you may be advised: ? Not to take supplements with vitamin C. ? To take a calcium supplement. ? To take a daily probiotic supplement. ? To take other supplements such as magnesium, fish oil, or vitamin B6.  Take all medicines and supplements as told by your health care provider.  Limit alcohol intake to no  more than 1 drink a day for nonpregnant women and 2 drinks a day for men. One drink equals 12 oz of beer, 5 oz of wine, or 1 oz of hard liquor.  Lose weight if told by your health care provider. Work with your dietitian to find strategies and an eating plan that works best for you. What foods are not recommended? Limit your intake of the following foods, or as told by your dietitian. Talk to your dietitian about specific foods you should avoid based on the type of kidney stones and your overall health. Grains Breads. Bagels. Rolls. Baked goods. Salted crackers. Cereal. Pasta. Vegetables Spinach. Rhubarb. Beets. Canned vegetables. Pickles. Olives. Meats and other protein foods Nuts. Nut butters. Large portions of meat, poultry, or fish. Salted or cured meats. Deli meats. Hot dogs. Sausages. Dairy Cheese. Beverages Regular soft drinks. Regular vegetable juice. Seasonings and other foods Seasoning blends with salt. Salad dressings. Canned soups. Soy sauce. Ketchup. Barbecue sauce. Canned pasta sauce. Casseroles. Pizza. Lasagna. Frozen meals. Potato chips. French fries. Summary  You can reduce your risk of kidney stones by making changes to your diet.  The most important thing you can do is drink enough fluid. You should drink enough fluid to keep your urine clear or pale yellow.  Ask your health care provider or dietitian how much protein from animal sources you should eat each day, and also how much salt and calcium you should have each day. This information is not intended to replace advice given to you by your health care provider. Make sure you discuss any questions you have with your health care provider. Document Released: 09/22/2010 Document Revised: 09/17/2018 Document Reviewed: 05/08/2016 Elsevier Patient Education  2020 Elsevier Inc.  

## 2019-03-19 NOTE — H&P (Signed)
Urology Preoperative H&P   Chief Complaint: kidney stone  History of Present Illness: Karen Brown is a 58 y.o. female who was referred by Dr. Orlie Dakin, MD who is here for renal calculi.  The problem is on the left side. She first stated noticing pain on 02/19/2019. This is not her first kidney stone. She is currently having flank pain. She denies having back pain, groin pain, nausea, vomiting, fever, and chills.   She underwent CT scan of the abdomen and pelvis 02/19/2019 which revealed a 8-10 mm left lower pole stone. The stone measured about 1000 Hounsfield units, 10 cm SSD, visible on the scout imaging. She also underwent an L-spine film which revealed the stone as well. A urinalysis was negative with a urine culture less than 10,000 CFU. UA here is clear. She has right lower back pain. She had L5-S1 surgery yrs ago and is on pain meds and pain contract.   She has a history of hematuria and underwent an evaluation in 2014 with a CT scan and cystoscopy.      Past Medical History:  Diagnosis Date  . Anxiety   . Bipolar 1 disorder ([place])   . Chronic migraine   . Chronically dry eyes   . Colitis, ischemic ([place])   . Depression   . Diverticulosis   . Dry mouth   . Fibromyalgia   . GERD (gastroesophageal reflux disease)   . Hemorrhoids   . Hyperlipemia   . Hypertension   . Hypothyroidism   . IBS (irritable bowel syndrome)   . PVC (premature ventricular contraction)     Past Surgical History:  Procedure Laterality Date  . ABDOMINAL HYSTERECTOMY     Partial   . APPENDECTOMY    . BACK SURGERY     Micro diskectomy  . BACK SURGERY     L facet rhzotomy  . CHOLECYSTECTOMY    . TONSILLECTOMY      Allergies:  Allergies  Allergen Reactions  . Other Other (See Comments)    Oral steroids; skin sensitivity  . Biaxin [Clarithromycin] Other (See Comments)    Bad taste and ringing in ear with dec hearing  . Clindamycin/Lincomycin Dermatitis  . Ketorolac Tromethamine   .  Pb-Hyoscy-Atropine-Scopolamine   . Penicillins     REACTION: HIVES  . Phenobarbital   . Sulfonamide Derivatives   . Topiramate   . Zaleplon     Family History  Problem Relation Age of Onset  . Kidney disease Father   . Coronary artery disease Father 50  . COPD Father 19       copd  . Heart disease Father   . Colon polyps Mother   . Osteoporosis Mother   . Colon cancer Neg Hx     Social History:  reports that she has never smoked. She has never used smokeless tobacco. She reports that she does not drink alcohol or use drugs.  ROS: A complete review of systems was performed.  All systems are negative except for pertinent findings as noted.  Physical Exam:  Vital signs in last 24 hours: Temp:  [99.2 F (37.3 C)] 99.2 F (37.3 C) (10/08 0645) Pulse Rate:  [95] 95 (10/08 0645) Resp:  [18] 18 (10/08 0645) BP: (150-160)/(93-104) 150/93 (10/08 0646) SpO2:  [99 %] 99 % (10/08 0645) Constitutional:  Alert and oriented, No acute distress Cardiovascular: Regular rate and rhythm, No JVD Respiratory: Normal respiratory effort, Lungs clear bilaterally GI: Abdomen is soft, nontender, nondistended, no abdominal masses GU: No  CVA tenderness Lymphatic: No lymphadenopathy Neurologic: Grossly intact, no focal deficits Psychiatric: Normal mood and affect  Laboratory Data:  No results for input(s): WBC, HGB, HCT, PLT in the last 72 hours.  No results for input(s): NA, K, CL, GLUCOSE, BUN, CALCIUM, CREATININE in the last 72 hours.  Invalid input(s): CO3   No results found for this or any previous visit (from the past 24 hour(s)). Recent Results (from the past 240 hour(s))  SARS CORONAVIRUS 2 (TAT 6-24 HRS) Nasopharyngeal Nasopharyngeal Swab     Status: None   Collection Time: 03/17/19 12:03 PM   Specimen: Nasopharyngeal Swab  Result Value Ref Range Status   SARS Coronavirus 2 NEGATIVE NEGATIVE Final    Comment: (NOTE) SARS-CoV-2 target nucleic acids are NOT DETECTED. The  SARS-CoV-2 RNA is generally detectable in upper and lower respiratory specimens during the acute phase of infection. Negative results do not preclude SARS-CoV-2 infection, do not rule out co-infections with other pathogens, and should not be used as the sole basis for treatment or other patient management decisions. Negative results must be combined with clinical observations, patient history, and epidemiological information. The expected result is Negative. Fact Sheet for Patients: SugarRoll.be Fact Sheet for Healthcare Providers: https://www.woods-mathews.com/ This test is not yet approved or cleared by the [place] FDA and  has been authorized for detection and/or diagnosis of SARS-CoV-2 by FDA under an Emergency Use Authorization (EUA). This EUA will remain  in effect (meaning this test can be used) for the duration of the COVID-19 declaration under Section 56 4(b)(1) of the Act, 21 U.S.C. section 360bbb-3(b)(1), unless the authorization is terminated or revoked sooner. Performed at [facility], [place] [address] [place], [place] [address]     Renal Function: No results for input(s): CREATININE in the last 168 hours. CrCl cannot be calculated (Unknown ideal weight.).  Radiologic Imaging: No results found.  I independently reviewed the above imaging studies.  Assessment and Plan [name] is a 58 y.o. female with 14 mm left lower pole stone   The risks, benefits and alternatives of LEFT ESWL was discussed with the patient. I described the risks which include arrhythmia, kidney contusion, kidney hemorrhage, need for transfusion, back discomfort, flank ecchymosis, flank abrasion, inability to fracture the stone, inability to pass stone fragments, Steinstrasse, infection associated with obstructing stones, need for an alternative surgical procedure and possible need for repeat shockwave lithotripsy.  The patient voices  understanding and wishes to proceed.      Ellison Hughs, MD 03/19/2019, 7:38 AM  [facility] Pager: [phone]

## 2019-03-19 NOTE — Op Note (Signed)
[facility] Operative Note  Treating Physician: Ellison Hughs, MD  Pre-op diagnosis: 14 mm left renal stone  Post-op diagnosis: Same   Procedure: LEFT ESWL  See Aris Everts OP note scanned into chart. Also because of the size, density, location and other factors that cannot be anticipated I feel this will likely be a staged procedure. This fact supersedes any indication in the scanned [place] stone operative note to the contrary

## 2019-03-19 NOTE — Anesthesia Postprocedure Evaluation (Signed)
Anesthesia Post Note  Patient: Karen Brown  Procedure(s) Performed: EXTRACORPOREAL SHOCK WAVE LITHOTRIPSY ([facility]) (Left )     Patient location during evaluation: PACU Anesthesia Type: MAC Level of consciousness: awake Pain management: pain level controlled Vital Signs Assessment: post-procedure vital signs reviewed and stable Respiratory status: spontaneous breathing Cardiovascular status: stable Postop Assessment: no apparent nausea or vomiting Anesthetic complications: no    Last Vitals:  Vitals:   03/19/19 0646 03/19/19 1017  BP: (!) 150/93 (!) 152/85  Pulse:  66  Resp:  18  Temp:    SpO2:  96%    Last Pain:  Vitals:   03/19/19 1017  TempSrc:   PainSc: 8    Pain Goal: Patients Stated Pain Goal: 1 (03/19/19 0744)                 Huston Foley

## 2019-03-20 ENCOUNTER — Encounter (HOSPITAL_COMMUNITY): Payer: Self-pay | Admitting: Urology

## 2019-03-26 ENCOUNTER — Ambulatory Visit (INDEPENDENT_AMBULATORY_CARE_PROVIDER_SITE_OTHER): Payer: Medicare Other | Admitting: Psychology

## 2019-03-26 DIAGNOSIS — F331 Major depressive disorder, recurrent, moderate: Secondary | ICD-10-CM

## 2019-04-02 DIAGNOSIS — Z79891 Long term (current) use of opiate analgesic: Secondary | ICD-10-CM | POA: Diagnosis not present

## 2019-04-02 DIAGNOSIS — M4726 Other spondylosis with radiculopathy, lumbar region: Secondary | ICD-10-CM | POA: Diagnosis not present

## 2019-04-02 DIAGNOSIS — M62838 Other muscle spasm: Secondary | ICD-10-CM | POA: Diagnosis not present

## 2019-04-02 DIAGNOSIS — N200 Calculus of kidney: Secondary | ICD-10-CM | POA: Diagnosis not present

## 2019-04-02 DIAGNOSIS — G894 Chronic pain syndrome: Secondary | ICD-10-CM | POA: Diagnosis not present

## 2019-04-06 DIAGNOSIS — M5127 Other intervertebral disc displacement, lumbosacral region: Secondary | ICD-10-CM | POA: Diagnosis not present

## 2019-04-06 DIAGNOSIS — M48061 Spinal stenosis, lumbar region without neurogenic claudication: Secondary | ICD-10-CM | POA: Diagnosis not present

## 2019-04-06 DIAGNOSIS — N200 Calculus of kidney: Secondary | ICD-10-CM | POA: Diagnosis not present

## 2019-04-06 DIAGNOSIS — M5136 Other intervertebral disc degeneration, lumbar region: Secondary | ICD-10-CM | POA: Diagnosis not present

## 2019-04-06 DIAGNOSIS — M47816 Spondylosis without myelopathy or radiculopathy, lumbar region: Secondary | ICD-10-CM | POA: Diagnosis not present

## 2019-04-09 DIAGNOSIS — M47816 Spondylosis without myelopathy or radiculopathy, lumbar region: Secondary | ICD-10-CM | POA: Diagnosis not present

## 2019-04-09 DIAGNOSIS — M5136 Other intervertebral disc degeneration, lumbar region: Secondary | ICD-10-CM | POA: Diagnosis not present

## 2019-04-09 DIAGNOSIS — M961 Postlaminectomy syndrome, not elsewhere classified: Secondary | ICD-10-CM | POA: Diagnosis not present

## 2019-04-16 ENCOUNTER — Other Ambulatory Visit: Payer: Self-pay | Admitting: Family Medicine

## 2019-04-16 DIAGNOSIS — Z1231 Encounter for screening mammogram for malignant neoplasm of breast: Secondary | ICD-10-CM

## 2019-04-21 ENCOUNTER — Ambulatory Visit: Payer: Medicare Other

## 2019-04-28 DIAGNOSIS — N200 Calculus of kidney: Secondary | ICD-10-CM | POA: Diagnosis not present

## 2019-04-29 ENCOUNTER — Ambulatory Visit (INDEPENDENT_AMBULATORY_CARE_PROVIDER_SITE_OTHER): Payer: Medicare Other | Admitting: Psychology

## 2019-04-29 DIAGNOSIS — F331 Major depressive disorder, recurrent, moderate: Secondary | ICD-10-CM

## 2019-05-01 ENCOUNTER — Other Ambulatory Visit: Payer: Self-pay

## 2019-05-01 ENCOUNTER — Ambulatory Visit
Admission: RE | Admit: 2019-05-01 | Discharge: 2019-05-01 | Disposition: A | Discharge status: Home / Self Care | Payer: Medicare Other | Source: Ambulatory Visit | Attending: Family Medicine | Admitting: Family Medicine

## 2019-05-01 DIAGNOSIS — Z1231 Encounter for screening mammogram for malignant neoplasm of breast: Secondary | ICD-10-CM

## 2019-05-04 DIAGNOSIS — Z79891 Long term (current) use of opiate analgesic: Secondary | ICD-10-CM | POA: Diagnosis not present

## 2019-05-04 DIAGNOSIS — G894 Chronic pain syndrome: Secondary | ICD-10-CM | POA: Diagnosis not present

## 2019-05-05 DIAGNOSIS — Z79891 Long term (current) use of opiate analgesic: Secondary | ICD-10-CM | POA: Diagnosis not present

## 2019-05-05 DIAGNOSIS — G894 Chronic pain syndrome: Secondary | ICD-10-CM | POA: Diagnosis not present

## 2019-05-05 DIAGNOSIS — M4726 Other spondylosis with radiculopathy, lumbar region: Secondary | ICD-10-CM | POA: Diagnosis not present

## 2019-05-05 DIAGNOSIS — M62838 Other muscle spasm: Secondary | ICD-10-CM | POA: Diagnosis not present

## 2019-05-18 ENCOUNTER — Other Ambulatory Visit: Payer: Self-pay | Admitting: Family Medicine

## 2019-05-18 DIAGNOSIS — G43809 Other migraine, not intractable, without status migrainosus: Secondary | ICD-10-CM

## 2019-05-19 NOTE — Telephone Encounter (Signed)
Requesting: Fiorcet Contract: N/A UDS: N/A Last OV: 02/26/2019 Next OV: N/A Last Refill: 02/17/2019, #[id number] RF Database:   Please advise

## 2019-06-03 ENCOUNTER — Ambulatory Visit: Payer: Medicare Other | Admitting: Psychology

## 2019-06-09 DIAGNOSIS — E039 Hypothyroidism, unspecified: Secondary | ICD-10-CM | POA: Diagnosis not present

## 2019-06-09 DIAGNOSIS — E559 Vitamin D deficiency, unspecified: Secondary | ICD-10-CM | POA: Diagnosis not present

## 2019-06-09 DIAGNOSIS — I10 Essential (primary) hypertension: Secondary | ICD-10-CM | POA: Diagnosis not present

## 2019-06-09 DIAGNOSIS — R7301 Impaired fasting glucose: Secondary | ICD-10-CM | POA: Diagnosis not present

## 2019-06-22 ENCOUNTER — Ambulatory Visit: Payer: Medicare Other

## 2019-06-30 DIAGNOSIS — Z79891 Long term (current) use of opiate analgesic: Secondary | ICD-10-CM | POA: Diagnosis not present

## 2019-06-30 DIAGNOSIS — G894 Chronic pain syndrome: Secondary | ICD-10-CM | POA: Diagnosis not present

## 2019-06-30 DIAGNOSIS — M4726 Other spondylosis with radiculopathy, lumbar region: Secondary | ICD-10-CM | POA: Diagnosis not present

## 2019-06-30 DIAGNOSIS — M62838 Other muscle spasm: Secondary | ICD-10-CM | POA: Diagnosis not present

## 2019-07-21 ENCOUNTER — Ambulatory Visit (INDEPENDENT_AMBULATORY_CARE_PROVIDER_SITE_OTHER): Payer: Medicare Other | Admitting: Psychology

## 2019-07-21 DIAGNOSIS — F331 Major depressive disorder, recurrent, moderate: Secondary | ICD-10-CM | POA: Diagnosis not present

## 2019-07-23 NOTE — Progress Notes (Addendum)
Virtual Visit via Audio Note  I connected with patient on 07/24/19 at  3:15 PM EST by audio enabled telemedicine application and verified that I am speaking with the correct person using two identifiers.   THIS ENCOUNTER IS A VIRTUAL VISIT DUE TO COVID-19 - PATIENT WAS NOT SEEN IN THE OFFICE. PATIENT HAS CONSENTED TO VIRTUAL VISIT / TELEMEDICINE VISIT   Location of patient: home  Location of provider: office  I discussed the limitations of evaluation and management by telemedicine and the availability of in person appointments. The patient expressed understanding and agreed to proceed.   Subjective:   Karen Brown is a 59 y.o. female who presents for Medicare Annual (Subsequent) preventive examination.  Review of Systems:  Home Safety/Smoke Alarms: Feels safe in home. Smoke alarms in place.  Lives w/ husband in 1 story home.    Female:   Pap-  declines     Mammo- 05/04/19      Dexa scan- 07/12/16       CCS- due 12/2019    Objective:     Vitals: Unable to assess. This visit is enabled though telemedicine due to Covid 19.   Advanced Directives 07/24/2019 03/19/2019 07/22/2018 07/04/2017 07/02/2016 06/28/2015  Does Patient Have a Medical Advance Directive? No No No No No Yes  Type of Advance Directive - - - - - Press photographer;Living will  Does patient want to make changes to medical advance directive? - - - - - No - Patient declined  Copy of [place] in Chart? - - - - - No - copy requested  Would patient like information on creating a medical advance directive? No - Patient declined No - Patient declined Yes (MAU/Ambulatory/Procedural Areas - Information given) Yes (MAU/Ambulatory/Procedural Areas - Information given) Yes (MAU/Ambulatory/Procedural Areas - Information given) -    Tobacco Social History   Tobacco Use  Smoking Status Never Smoker  Smokeless Tobacco Never Used     Counseling given: Not Answered   Clinical Intake: Pain : (pt  states she has chronic pain. sees pain mgmnt)     Past Medical History:  Diagnosis Date  . Anxiety   . Bipolar 1 disorder ([place])   . Chronic migraine   . Chronically dry eyes   . Colitis, ischemic ([place])   . Complication of anesthesia    versed does not work as stated per pt   . Depression   . Diverticulosis   . Dry mouth   . Family history of adverse reaction to anesthesia   . Fibromyalgia   . GERD (gastroesophageal reflux disease)   . Hemorrhoids   . Hyperlipemia   . Hypertension   . Hypothyroidism   . IBS (irritable bowel syndrome)   . PVC (premature ventricular contraction)    Past Surgical History:  Procedure Laterality Date  . ABDOMINAL HYSTERECTOMY     Partial   . APPENDECTOMY    . BACK SURGERY     Micro diskectomy  . BACK SURGERY     L facet rhzotomy  . CHOLECYSTECTOMY    . EXTRACORPOREAL SHOCK WAVE LITHOTRIPSY Left 03/19/2019   Procedure: EXTRACORPOREAL SHOCK WAVE LITHOTRIPSY ([facility]);  Surgeon: Ceasar Mons, MD;  Location: [facility];  Service: Urology;  Laterality: Left;  . TONSILLECTOMY     Family History  Problem Relation Age of Onset  . Kidney disease Father   . Coronary artery disease Father 3  . COPD Father 49       copd  .  Heart disease Father   . Colon polyps Mother   . Osteoporosis Mother   . Colon cancer Neg Hx    Social History   Socioeconomic History  . Marital status: Married    Spouse name: Not on file  . Number of children: 0  . Years of education: Not on file  . Highest education level: Not on file  Occupational History  . Occupation: Disabled     Employer: [employer]  Tobacco Use  . Smoking status: Never Smoker  . Smokeless tobacco: Never Used  Substance and Sexual Activity  . Alcohol use: No  . Drug use: No  . Sexual activity: Yes  Other Topics Concern  . Not on file  Social History Narrative   Caffeine occ    Social Determinants of Health   Financial Resource Strain:   . Difficulty of Paying Living  Expenses: Not on file  Food Insecurity:   . Worried About Charity fundraiser in the Last Year: Not on file  . Ran Out of Food in the Last Year: Not on file  Transportation Needs:   . Lack of Transportation (Medical): Not on file  . Lack of Transportation (Non-Medical): Not on file  Physical Activity:   . Days of Exercise per Week: Not on file  . Minutes of Exercise per Session: Not on file  Stress:   . Feeling of Stress : Not on file  Social Connections:   . Frequency of Communication with Friends and Family: Not on file  . Frequency of Social Gatherings with Friends and Family: Not on file  . Attends Religious Services: Not on file  . Active Member of Clubs or Organizations: Not on file  . Attends Archivist Meetings: Not on file  . Marital Status: Not on file    Outpatient Encounter Medications as of 07/24/2019  Medication Sig  . butalbital-acetaminophen-caffeine (FIORICET) 50-325-40 MG tablet 1 po q4h prn  . CANNABIDIOL PO Take 0.75 mLs by mouth daily.   . clonazePAM (KLONOPIN) 1 MG tablet Take 1 mg by mouth 3 (three) times daily as needed.    . fentaNYL (DURAGESIC - DOSED MCG/HR) 75 MCG/HR Place 1 patch onto the skin every other day.    . lamoTRIgine (LAMICTAL) 25 MG tablet Take 25 mg by mouth 2 (two) times daily. 1 in the morning and 1 in the evening  . levothyroxine (SYNTHROID) 150 MCG tablet Take 1 tablet (150 mcg total) by mouth daily before breakfast.  . lisinopril (PRINIVIL,ZESTRIL) 20 MG tablet Take 1 tablet by mouth daily.  Marland Kitchen oxyCODONE-acetaminophen (PERCOCET) 10-325 MG tablet Take 1 tablet by mouth daily. Will take up to 6 tabs a day  . PRISTIQ 100 MG 24 hr tablet Take 1 tablet by mouth daily.   . promethazine (PHENERGAN) 25 MG tablet Take 1 tablet (25 mg total) by mouth as needed.  Marland Kitchen tiZANidine (ZANAFLEX) 4 MG capsule Take 4 mg by mouth 3 (three) times daily as needed for muscle spasms.  . clidinium-chlordiazePOXIDE (LIBRAX) 5-2.5 MG per capsule TAKE ONE  CAPSULE BY MOUTH 3 TIMES A DAY BEFORE MEALS AS NEEDED (Patient not taking: Reported on 07/24/2019)  . nitrofurantoin, macrocrystal-monohydrate, (MACROBID) 100 MG capsule Take 1 capsule (100 mg total) by mouth 2 (two) times daily. (Patient not taking: Reported on 07/24/2019)  . OVER THE COUNTER MEDICATION Take 3 tablets by mouth daily. cartilast  . rosuvastatin (CRESTOR) 10 MG tablet Take 1 tablet (10 mg total) by mouth at bedtime. (Patient not taking:  Reported on 07/24/2019)   No facility-administered encounter medications on file as of 07/24/2019.    Activities of Daily Living In your present state of health, do you have any difficulty performing the following activities: 07/24/2019 03/19/2019  Hearing? N N  Vision? N N  Difficulty concentrating or making decisions? N N  Walking or climbing stairs? N N  Dressing or bathing? N N  Doing errands, shopping? N -  Preparing Food and eating ? N -  Using the Toilet? N -  In the past six months, have you accidently leaked urine? N -  Do you have problems with loss of bowel control? N -  Managing your Medications? N -  Managing your Finances? N -  Housekeeping or managing your Housekeeping? N -  Some recent data might be hidden    Patient Care Team: Carollee Herter, Alferd Apa, DO as PCP - General Nicholaus Bloom, MD as Consulting Physician (Anesthesiology) Jacelyn Pi, MD as Consulting Physician (Endocrinology) Phylliss Bob, MD as Consulting Physician (Orthopedic Surgery)    Assessment:   This is a routine wellness examination for Karen Brown. Physical assessment deferred to PCP.  Exercise Activities and Dietary recommendations Current Exercise Habits: The patient does not participate in regular exercise at present, Exercise limited by: None identified   Diet (meal preparation, eat out, water intake, caffeinated beverages, dairy products, fruits and vegetables): well balanced    Goals    . to be doing better than this year in general health.         Fall Risk Fall Risk  07/24/2019 07/22/2018 07/04/2017 07/02/2016 06/28/2015  Falls in the past year? 0 0 Yes No No  Number falls in past yr: 0 - 1 - -  Injury with Fall? 0 - - - -  Follow up Education provided;Falls prevention discussed - Education provided;Falls prevention discussed - -   Depression Screen PHQ 2/9 Scores 07/24/2019 07/22/2018 07/22/2018 07/04/2017  PHQ - 2 Score 0 4 1 1   PHQ- 9 Score - 11 - -     Cognitive Function Ad8 score reviewed for issues:  Issues making decisions:no  Less interest in hobbies / activities:no  Repeats questions, stories (family complaining):no  Trouble using ordinary gadgets (microwave, computer, phone):no  Forgets the month or year: no  Mismanaging finances: no  Remembering appts:no  Daily problems with thinking and/or memory:no Ad8 score is=0   MMSE - Mini Mental State Exam 07/02/2016  Orientation to time 5  Orientation to Place 5  Registration 3  Attention/ Calculation 5  Recall 3  Language- name 2 objects 2  Language- repeat 1  Language- follow 3 step command 3  Language- read & follow direction 1  Write a sentence 1  Copy design 1  Total score 30        Immunization History  Administered Date(s) Administered  . Influenza Inj Mdck Quad Pf 04/04/2017  . Influenza Split 04/09/2011, 04/03/2012  . Influenza Whole 03/15/2010  . Influenza,inj,Quad PF,6+ Mos 04/07/2013, 03/26/2014, 03/22/2015, 04/13/2016, 02/26/2019  . Influenza-Unspecified 04/04/2017, 04/09/2018  . Td 01/23/2007  . Tdap 07/04/2017   Screening Tests Health Maintenance  Topic Date Due  . Hepatitis C Screening  07/05/2023 (Originally 20-Feb-1961)  . HIV Screening  07/05/2023 (Originally 03/11/1976)  . COLONOSCOPY  12/10/2019  . MAMMOGRAM  04/30/2020  . TETANUS/TDAP  07/05/2027  . INFLUENZA VACCINE  Completed      Plan:   See you next year!  Continue to eat heart healthy diet (full of fruits, vegetables, whole grains,  lean protein, water--limit  salt, fat, and sugar intake) and increase physical activity as tolerated.  Continue doing brain stimulating activities (puzzles, reading, adult coloring books, staying active) to keep memory sharp.   Bring a copy of your living will and/or healthcare power of attorney to your next office visit.    I have personally reviewed and noted the following in the patient's chart:   . Medical and social history . Use of alcohol, tobacco or illicit drugs  . Current medications and supplements . Functional ability and status . Nutritional status . Physical activity . Advanced directives . List of other physicians . Hospitalizations, surgeries, and ER visits in previous 12 months . Vitals . Screenings to include cognitive, depression, and falls . Referrals and appointments  In addition, I have reviewed and discussed with patient certain preventive protocols, quality metrics, and best practice recommendations. A written personalized care plan for preventive services as well as general preventive health recommendations were provided to patient.     Shela Nevin, [place]  07/24/2019   PCP Note: Karen Brown states that Karen Brown's pharmacy called her to say that PCP would no longer prescribe Fioricet for her migraines. Pt reports she is still having migraines. Pt would like to know if there has been a miscommunication or what she should do for migraines moving forward.  We never received refill request  Med refilled note reviewed  Ann Held, DO

## 2019-07-24 ENCOUNTER — Ambulatory Visit (INDEPENDENT_AMBULATORY_CARE_PROVIDER_SITE_OTHER): Payer: Medicare Other | Admitting: *Deleted

## 2019-07-24 ENCOUNTER — Other Ambulatory Visit: Payer: Self-pay

## 2019-07-24 ENCOUNTER — Encounter: Payer: Self-pay | Admitting: *Deleted

## 2019-07-24 ENCOUNTER — Other Ambulatory Visit: Payer: Self-pay | Admitting: Family Medicine

## 2019-07-24 DIAGNOSIS — Z0000 Encounter for general adult medical examination without abnormal findings: Secondary | ICD-10-CM | POA: Diagnosis not present

## 2019-07-24 DIAGNOSIS — G43809 Other migraine, not intractable, without status migrainosus: Secondary | ICD-10-CM

## 2019-07-24 MED ORDER — BUTALBITAL-APAP-CAFFEINE 50-325-40 MG PO TABS: ORAL_TABLET | ORAL | 0 refills | Status: DC

## 2019-07-24 NOTE — Patient Instructions (Signed)
See you next year!  Continue to eat heart healthy diet (full of fruits, vegetables, whole grains, lean protein, water--limit salt, fat, and sugar intake) and increase physical activity as tolerated.  Continue doing brain stimulating activities (puzzles, reading, adult coloring books, staying active) to keep memory sharp.   Bring a copy of your living will and/or healthcare power of attorney to your next office visit.   Karen Brown , Thank you for taking time to come for your Medicare Wellness Visit. I appreciate your ongoing commitment to your health goals. Please review the following plan we discussed and let me know if I can assist you in the future.   These are the goals we discussed: Goals    . Increase physical activity    . to be doing better than this year in general health.        This is a list of the screening recommended for you and due dates:  Health Maintenance  Topic Date Due  .  Hepatitis C: One time screening is recommended by Center for Disease Control  (CDC) for  adults born from [birth year] through 1967.   07/05/2023*  . HIV Screening  07/05/2023*  . Colon Cancer Screening  12/10/2019  . Mammogram  04/30/2020  . Tetanus Vaccine  07/05/2027  . Flu Shot  Completed  *Topic was postponed. The date shown is not the original due date.    Preventive Care 50-14 Years Old, Female Preventive care refers to visits with your health care provider and lifestyle choices that can promote health and wellness. This includes:  A yearly physical exam. This may also be called an annual well check.  Regular dental visits and eye exams.  Immunizations.  Screening for certain conditions.  Healthy lifestyle choices, such as eating a healthy diet, getting regular exercise, not using drugs or products that contain nicotine and tobacco, and limiting alcohol use. What can I expect for my preventive care visit? Physical exam Your health care provider will check your:  Height and weight.  This may be used to calculate body mass index (BMI), which tells if you are at a healthy weight.  Heart rate and blood pressure.  Skin for abnormal spots. Counseling Your health care provider may ask you questions about your:  Alcohol, tobacco, and drug use.  Emotional well-being.  Home and relationship well-being.  Sexual activity.  Eating habits.  Work and work Statistician.  Method of birth control.  Menstrual cycle.  Pregnancy history. What immunizations do I need?  Influenza (flu) vaccine  This is recommended every year. Tetanus, diphtheria, and pertussis (Tdap) vaccine  You may need a Td booster every 10 years. Varicella (chickenpox) vaccine  You may need this if you have not been vaccinated. Zoster (shingles) vaccine  You may need this after age 14. Measles, mumps, and rubella (MMR) vaccine  You may need at least one dose of MMR if you were born in 1957 or later. You may also need a second dose. Pneumococcal conjugate (PCV13) vaccine  You may need this if you have certain conditions and were not previously vaccinated. Pneumococcal polysaccharide (PPSV23) vaccine  You may need one or two doses if you smoke cigarettes or if you have certain conditions. Meningococcal conjugate (MenACWY) vaccine  You may need this if you have certain conditions. Hepatitis A vaccine  You may need this if you have certain conditions or if you travel or work in places where you may be exposed to hepatitis A. Hepatitis B vaccine  You may need this if you have certain conditions or if you travel or work in places where you may be exposed to hepatitis B. Haemophilus influenzae type b (Hib) vaccine  You may need this if you have certain conditions. Human papillomavirus (HPV) vaccine  If recommended by your health care provider, you may need three doses over 6 months. You may receive vaccines as individual doses or as more than one vaccine together in one shot (combination  vaccines). Talk with your health care provider about the risks and benefits of combination vaccines. What tests do I need? Blood tests  Lipid and cholesterol levels. These may be checked every 5 years, or more frequently if you are over 47 years old.  Hepatitis C test.  Hepatitis B test. Screening  Lung cancer screening. You may have this screening every year starting at age 47 if you have a 30-pack-year history of smoking and currently smoke or have quit within the past 15 years.  Colorectal cancer screening. All adults should have this screening starting at age 79 and continuing until age 45. Your health care provider may recommend screening at age 56 if you are at increased risk. You will have tests every 1-10 years, depending on your results and the type of screening test.  Diabetes screening. This is done by checking your blood sugar (glucose) after you have not eaten for a while (fasting). You may have this done every 1-3 years.  Mammogram. This may be done every 1-2 years. Talk with your health care provider about when you should start having regular mammograms. This may depend on whether you have a family history of breast cancer.  BRCA-related cancer screening. This may be done if you have a family history of breast, ovarian, tubal, or peritoneal cancers.  Pelvic exam and Pap test. This may be done every 3 years starting at age 7. Starting at age 45, this may be done every 5 years if you have a Pap test in combination with an HPV test. Other tests  Sexually transmitted disease (STD) testing.  Bone density scan. This is done to screen for osteoporosis. You may have this scan if you are at high risk for osteoporosis. Follow these instructions at home: Eating and drinking  Eat a diet that includes fresh fruits and vegetables, whole grains, lean protein, and low-fat dairy.  Take vitamin and mineral supplements as recommended by your health care provider.  Do not drink alcohol  if: ? Your health care provider tells you not to drink. ? You are pregnant, may be pregnant, or are planning to become pregnant.  If you drink alcohol: ? Limit how much you have to 0-1 drink a day. ? Be aware of how much alcohol is in your drink. In the [place] one drink equals one 12 oz bottle of beer (355 mL), one 5 oz glass of wine (148 mL), or one 1 oz glass of hard liquor (44 mL). Lifestyle  Take daily care of your teeth and gums.  Stay active. Exercise for at least 30 minutes on 5 or more days each week.  Do not use any products that contain nicotine or tobacco, such as cigarettes, e-cigarettes, and chewing tobacco. If you need help quitting, ask your health care provider.  If you are sexually active, practice safe sex. Use a condom or other form of birth control (contraception) in order to prevent pregnancy and STIs (sexually transmitted infections).  If told by your health care provider, take low-dose aspirin daily starting  at age 35. What's next?  Visit your health care provider once a year for a well check visit.  Ask your health care provider how often you should have your eyes and teeth checked.  Stay up to date on all vaccines. This information is not intended to replace advice given to you by your health care provider. Make sure you discuss any questions you have with your health care provider. Document Revised: 02/06/2018 Document Reviewed: 02/06/2018 Elsevier Patient Education  2020 [employer].

## 2019-08-11 DIAGNOSIS — E039 Hypothyroidism, unspecified: Secondary | ICD-10-CM | POA: Diagnosis not present

## 2019-08-11 DIAGNOSIS — E559 Vitamin D deficiency, unspecified: Secondary | ICD-10-CM | POA: Diagnosis not present

## 2019-08-11 DIAGNOSIS — R7301 Impaired fasting glucose: Secondary | ICD-10-CM | POA: Diagnosis not present

## 2019-08-25 DIAGNOSIS — M4726 Other spondylosis with radiculopathy, lumbar region: Secondary | ICD-10-CM | POA: Diagnosis not present

## 2019-08-25 DIAGNOSIS — Z79891 Long term (current) use of opiate analgesic: Secondary | ICD-10-CM | POA: Diagnosis not present

## 2019-08-25 DIAGNOSIS — M62838 Other muscle spasm: Secondary | ICD-10-CM | POA: Diagnosis not present

## 2019-08-25 DIAGNOSIS — G894 Chronic pain syndrome: Secondary | ICD-10-CM | POA: Diagnosis not present

## 2019-09-03 ENCOUNTER — Ambulatory Visit (INDEPENDENT_AMBULATORY_CARE_PROVIDER_SITE_OTHER): Payer: Medicare Other | Admitting: Psychology

## 2019-09-03 DIAGNOSIS — F331 Major depressive disorder, recurrent, moderate: Secondary | ICD-10-CM | POA: Diagnosis not present

## 2019-09-30 ENCOUNTER — Ambulatory Visit (INDEPENDENT_AMBULATORY_CARE_PROVIDER_SITE_OTHER): Payer: Medicare Other | Admitting: Psychology

## 2019-09-30 DIAGNOSIS — F331 Major depressive disorder, recurrent, moderate: Secondary | ICD-10-CM | POA: Diagnosis not present

## 2019-10-20 DIAGNOSIS — G894 Chronic pain syndrome: Secondary | ICD-10-CM | POA: Diagnosis not present

## 2019-10-20 DIAGNOSIS — M4726 Other spondylosis with radiculopathy, lumbar region: Secondary | ICD-10-CM | POA: Diagnosis not present

## 2019-10-20 DIAGNOSIS — M62838 Other muscle spasm: Secondary | ICD-10-CM | POA: Diagnosis not present

## 2019-10-20 DIAGNOSIS — Z79891 Long term (current) use of opiate analgesic: Secondary | ICD-10-CM | POA: Diagnosis not present

## 2019-11-04 ENCOUNTER — Ambulatory Visit (INDEPENDENT_AMBULATORY_CARE_PROVIDER_SITE_OTHER): Payer: Medicare Other | Admitting: Psychology

## 2019-11-04 DIAGNOSIS — F331 Major depressive disorder, recurrent, moderate: Secondary | ICD-10-CM

## 2019-11-10 DIAGNOSIS — N200 Calculus of kidney: Secondary | ICD-10-CM | POA: Diagnosis not present

## 2019-11-12 DIAGNOSIS — N200 Calculus of kidney: Secondary | ICD-10-CM | POA: Diagnosis not present

## 2019-11-12 DIAGNOSIS — R310 Gross hematuria: Secondary | ICD-10-CM | POA: Diagnosis not present

## 2019-11-17 DIAGNOSIS — E559 Vitamin D deficiency, unspecified: Secondary | ICD-10-CM | POA: Diagnosis not present

## 2019-11-17 DIAGNOSIS — E039 Hypothyroidism, unspecified: Secondary | ICD-10-CM | POA: Diagnosis not present

## 2019-11-17 DIAGNOSIS — I10 Essential (primary) hypertension: Secondary | ICD-10-CM | POA: Diagnosis not present

## 2019-11-17 DIAGNOSIS — R7301 Impaired fasting glucose: Secondary | ICD-10-CM | POA: Diagnosis not present

## 2019-11-19 DIAGNOSIS — N200 Calculus of kidney: Secondary | ICD-10-CM | POA: Diagnosis not present

## 2019-11-19 DIAGNOSIS — N3000 Acute cystitis without hematuria: Secondary | ICD-10-CM | POA: Diagnosis not present

## 2019-12-02 ENCOUNTER — Ambulatory Visit (INDEPENDENT_AMBULATORY_CARE_PROVIDER_SITE_OTHER): Payer: Medicare Other | Admitting: Psychology

## 2019-12-02 DIAGNOSIS — F331 Major depressive disorder, recurrent, moderate: Secondary | ICD-10-CM | POA: Diagnosis not present

## 2019-12-16 DIAGNOSIS — M4726 Other spondylosis with radiculopathy, lumbar region: Secondary | ICD-10-CM | POA: Diagnosis not present

## 2019-12-16 DIAGNOSIS — M62838 Other muscle spasm: Secondary | ICD-10-CM | POA: Diagnosis not present

## 2019-12-16 DIAGNOSIS — G894 Chronic pain syndrome: Secondary | ICD-10-CM | POA: Diagnosis not present

## 2019-12-16 DIAGNOSIS — Z79891 Long term (current) use of opiate analgesic: Secondary | ICD-10-CM | POA: Diagnosis not present

## 2019-12-29 ENCOUNTER — Ambulatory Visit (INDEPENDENT_AMBULATORY_CARE_PROVIDER_SITE_OTHER): Payer: Medicare Other | Admitting: Psychology

## 2019-12-29 DIAGNOSIS — F331 Major depressive disorder, recurrent, moderate: Secondary | ICD-10-CM | POA: Diagnosis not present

## 2020-01-05 DIAGNOSIS — N3000 Acute cystitis without hematuria: Secondary | ICD-10-CM | POA: Diagnosis not present

## 2020-01-07 DIAGNOSIS — K5730 Diverticulosis of large intestine without perforation or abscess without bleeding: Secondary | ICD-10-CM | POA: Diagnosis not present

## 2020-01-07 DIAGNOSIS — Z9049 Acquired absence of other specified parts of digestive tract: Secondary | ICD-10-CM | POA: Diagnosis not present

## 2020-01-07 DIAGNOSIS — I878 Other specified disorders of veins: Secondary | ICD-10-CM | POA: Diagnosis not present

## 2020-01-07 DIAGNOSIS — N200 Calculus of kidney: Secondary | ICD-10-CM | POA: Diagnosis not present

## 2020-01-07 DIAGNOSIS — R8279 Other abnormal findings on microbiological examination of urine: Secondary | ICD-10-CM | POA: Diagnosis not present

## 2020-01-27 ENCOUNTER — Ambulatory Visit (INDEPENDENT_AMBULATORY_CARE_PROVIDER_SITE_OTHER): Payer: Medicare Other | Admitting: Psychology

## 2020-01-27 DIAGNOSIS — F331 Major depressive disorder, recurrent, moderate: Secondary | ICD-10-CM | POA: Diagnosis not present

## 2020-01-28 DIAGNOSIS — E039 Hypothyroidism, unspecified: Secondary | ICD-10-CM | POA: Diagnosis not present

## 2020-01-28 DIAGNOSIS — R310 Gross hematuria: Secondary | ICD-10-CM | POA: Diagnosis not present

## 2020-01-28 DIAGNOSIS — N200 Calculus of kidney: Secondary | ICD-10-CM | POA: Diagnosis not present

## 2020-02-10 DIAGNOSIS — M4726 Other spondylosis with radiculopathy, lumbar region: Secondary | ICD-10-CM | POA: Diagnosis not present

## 2020-02-10 DIAGNOSIS — G894 Chronic pain syndrome: Secondary | ICD-10-CM | POA: Diagnosis not present

## 2020-02-10 DIAGNOSIS — Z79891 Long term (current) use of opiate analgesic: Secondary | ICD-10-CM | POA: Diagnosis not present

## 2020-02-10 DIAGNOSIS — M62838 Other muscle spasm: Secondary | ICD-10-CM | POA: Diagnosis not present

## 2020-02-23 ENCOUNTER — Ambulatory Visit (INDEPENDENT_AMBULATORY_CARE_PROVIDER_SITE_OTHER): Payer: Medicare Other | Admitting: Psychology

## 2020-02-23 DIAGNOSIS — F331 Major depressive disorder, recurrent, moderate: Secondary | ICD-10-CM | POA: Diagnosis not present

## 2020-03-09 DIAGNOSIS — M4726 Other spondylosis with radiculopathy, lumbar region: Secondary | ICD-10-CM | POA: Diagnosis not present

## 2020-03-09 DIAGNOSIS — Z79891 Long term (current) use of opiate analgesic: Secondary | ICD-10-CM | POA: Diagnosis not present

## 2020-03-09 DIAGNOSIS — M62838 Other muscle spasm: Secondary | ICD-10-CM | POA: Diagnosis not present

## 2020-03-09 DIAGNOSIS — G894 Chronic pain syndrome: Secondary | ICD-10-CM | POA: Diagnosis not present

## 2020-03-23 ENCOUNTER — Ambulatory Visit (INDEPENDENT_AMBULATORY_CARE_PROVIDER_SITE_OTHER): Payer: Medicare Other | Admitting: Psychology

## 2020-03-23 ENCOUNTER — Other Ambulatory Visit: Payer: Self-pay | Admitting: Family Medicine

## 2020-03-23 DIAGNOSIS — F331 Major depressive disorder, recurrent, moderate: Secondary | ICD-10-CM

## 2020-03-23 DIAGNOSIS — R110 Nausea: Secondary | ICD-10-CM

## 2020-03-23 DIAGNOSIS — G43809 Other migraine, not intractable, without status migrainosus: Secondary | ICD-10-CM

## 2020-03-28 ENCOUNTER — Other Ambulatory Visit: Payer: Self-pay | Admitting: Family Medicine

## 2020-03-28 DIAGNOSIS — R110 Nausea: Secondary | ICD-10-CM

## 2020-03-28 MED ORDER — PROMETHAZINE HCL 25 MG PO TABS: 25.0000 mg | ORAL_TABLET | ORAL | 0 refills | Status: DC | PRN

## 2020-04-06 DIAGNOSIS — M4726 Other spondylosis with radiculopathy, lumbar region: Secondary | ICD-10-CM | POA: Diagnosis not present

## 2020-04-06 DIAGNOSIS — M62838 Other muscle spasm: Secondary | ICD-10-CM | POA: Diagnosis not present

## 2020-04-06 DIAGNOSIS — G894 Chronic pain syndrome: Secondary | ICD-10-CM | POA: Diagnosis not present

## 2020-04-06 DIAGNOSIS — Z79891 Long term (current) use of opiate analgesic: Secondary | ICD-10-CM | POA: Diagnosis not present

## 2020-04-07 ENCOUNTER — Other Ambulatory Visit: Payer: Self-pay | Admitting: Family Medicine

## 2020-04-07 DIAGNOSIS — Z1231 Encounter for screening mammogram for malignant neoplasm of breast: Secondary | ICD-10-CM

## 2020-04-12 ENCOUNTER — Ambulatory Visit (INDEPENDENT_AMBULATORY_CARE_PROVIDER_SITE_OTHER): Payer: Medicare Other | Admitting: Psychology

## 2020-04-12 DIAGNOSIS — F331 Major depressive disorder, recurrent, moderate: Secondary | ICD-10-CM | POA: Diagnosis not present

## 2020-05-04 DIAGNOSIS — G894 Chronic pain syndrome: Secondary | ICD-10-CM | POA: Diagnosis not present

## 2020-05-04 DIAGNOSIS — M4726 Other spondylosis with radiculopathy, lumbar region: Secondary | ICD-10-CM | POA: Diagnosis not present

## 2020-05-04 DIAGNOSIS — M62838 Other muscle spasm: Secondary | ICD-10-CM | POA: Diagnosis not present

## 2020-05-04 DIAGNOSIS — Z79891 Long term (current) use of opiate analgesic: Secondary | ICD-10-CM | POA: Diagnosis not present

## 2020-05-10 ENCOUNTER — Other Ambulatory Visit: Payer: Self-pay | Admitting: Family Medicine

## 2020-05-10 DIAGNOSIS — R197 Diarrhea, unspecified: Secondary | ICD-10-CM

## 2020-05-10 NOTE — Telephone Encounter (Signed)
Requesting:Librax 5-2.5mg  Contract: N/A UDS: N/A Last Visit: 07/24/2019 W/ Glenard Haring Next Visit: None scheduled  Last Refill: 01/07/2015 #90 and 2RF Pt sig: 1 capsule tid prn  Please Advise

## 2020-05-11 ENCOUNTER — Ambulatory Visit (INDEPENDENT_AMBULATORY_CARE_PROVIDER_SITE_OTHER): Payer: Medicare Other | Admitting: Psychology

## 2020-05-11 DIAGNOSIS — F331 Major depressive disorder, recurrent, moderate: Secondary | ICD-10-CM | POA: Diagnosis not present

## 2020-05-13 ENCOUNTER — Ambulatory Visit: Payer: Medicare Other

## 2020-06-02 DIAGNOSIS — M62838 Other muscle spasm: Secondary | ICD-10-CM | POA: Diagnosis not present

## 2020-06-02 DIAGNOSIS — G894 Chronic pain syndrome: Secondary | ICD-10-CM | POA: Diagnosis not present

## 2020-06-02 DIAGNOSIS — Z79891 Long term (current) use of opiate analgesic: Secondary | ICD-10-CM | POA: Diagnosis not present

## 2020-06-02 DIAGNOSIS — M4726 Other spondylosis with radiculopathy, lumbar region: Secondary | ICD-10-CM | POA: Diagnosis not present

## 2020-06-06 ENCOUNTER — Ambulatory Visit: Payer: Medicare Other

## 2020-06-08 ENCOUNTER — Ambulatory Visit (INDEPENDENT_AMBULATORY_CARE_PROVIDER_SITE_OTHER): Payer: Medicare Other | Admitting: Psychology

## 2020-06-08 DIAGNOSIS — F331 Major depressive disorder, recurrent, moderate: Secondary | ICD-10-CM

## 2020-06-16 DIAGNOSIS — E7800 Pure hypercholesterolemia, unspecified: Secondary | ICD-10-CM | POA: Diagnosis not present

## 2020-06-16 DIAGNOSIS — E559 Vitamin D deficiency, unspecified: Secondary | ICD-10-CM | POA: Diagnosis not present

## 2020-06-16 DIAGNOSIS — E039 Hypothyroidism, unspecified: Secondary | ICD-10-CM | POA: Diagnosis not present

## 2020-06-16 DIAGNOSIS — I10 Essential (primary) hypertension: Secondary | ICD-10-CM | POA: Diagnosis not present

## 2020-06-16 DIAGNOSIS — R7301 Impaired fasting glucose: Secondary | ICD-10-CM | POA: Diagnosis not present

## 2020-06-16 DIAGNOSIS — N200 Calculus of kidney: Secondary | ICD-10-CM | POA: Diagnosis not present

## 2020-06-30 DIAGNOSIS — G894 Chronic pain syndrome: Secondary | ICD-10-CM | POA: Diagnosis not present

## 2020-06-30 DIAGNOSIS — M4726 Other spondylosis with radiculopathy, lumbar region: Secondary | ICD-10-CM | POA: Diagnosis not present

## 2020-06-30 DIAGNOSIS — Z79891 Long term (current) use of opiate analgesic: Secondary | ICD-10-CM | POA: Diagnosis not present

## 2020-06-30 DIAGNOSIS — M62838 Other muscle spasm: Secondary | ICD-10-CM | POA: Diagnosis not present

## 2020-07-06 ENCOUNTER — Ambulatory Visit (INDEPENDENT_AMBULATORY_CARE_PROVIDER_SITE_OTHER): Payer: Medicare Other | Admitting: Psychology

## 2020-07-06 DIAGNOSIS — F331 Major depressive disorder, recurrent, moderate: Secondary | ICD-10-CM

## 2020-07-13 ENCOUNTER — Ambulatory Visit
Admission: RE | Admit: 2020-07-13 | Discharge: 2020-07-13 | Disposition: A | Discharge status: Home / Self Care | Payer: Medicare Other | Source: Ambulatory Visit | Attending: Family Medicine | Admitting: Family Medicine

## 2020-07-13 ENCOUNTER — Other Ambulatory Visit: Payer: Self-pay

## 2020-07-13 DIAGNOSIS — Z1231 Encounter for screening mammogram for malignant neoplasm of breast: Secondary | ICD-10-CM | POA: Diagnosis not present

## 2020-07-28 DIAGNOSIS — M62838 Other muscle spasm: Secondary | ICD-10-CM | POA: Diagnosis not present

## 2020-07-28 DIAGNOSIS — G894 Chronic pain syndrome: Secondary | ICD-10-CM | POA: Diagnosis not present

## 2020-07-28 DIAGNOSIS — M4726 Other spondylosis with radiculopathy, lumbar region: Secondary | ICD-10-CM | POA: Diagnosis not present

## 2020-07-28 DIAGNOSIS — E875 Hyperkalemia: Secondary | ICD-10-CM | POA: Diagnosis not present

## 2020-07-28 DIAGNOSIS — Z79891 Long term (current) use of opiate analgesic: Secondary | ICD-10-CM | POA: Diagnosis not present

## 2020-08-03 ENCOUNTER — Ambulatory Visit (INDEPENDENT_AMBULATORY_CARE_PROVIDER_SITE_OTHER): Payer: Medicare Other | Admitting: Psychology

## 2020-08-03 DIAGNOSIS — F331 Major depressive disorder, recurrent, moderate: Secondary | ICD-10-CM | POA: Diagnosis not present

## 2020-08-08 DIAGNOSIS — N200 Calculus of kidney: Secondary | ICD-10-CM | POA: Diagnosis not present

## 2020-08-16 DIAGNOSIS — D2372 Other benign neoplasm of skin of left lower limb, including hip: Secondary | ICD-10-CM | POA: Diagnosis not present

## 2020-08-16 DIAGNOSIS — L578 Other skin changes due to chronic exposure to nonionizing radiation: Secondary | ICD-10-CM | POA: Diagnosis not present

## 2020-08-16 DIAGNOSIS — L858 Other specified epidermal thickening: Secondary | ICD-10-CM | POA: Diagnosis not present

## 2020-08-16 DIAGNOSIS — D2272 Melanocytic nevi of left lower limb, including hip: Secondary | ICD-10-CM | POA: Diagnosis not present

## 2020-08-16 DIAGNOSIS — D235 Other benign neoplasm of skin of trunk: Secondary | ICD-10-CM | POA: Diagnosis not present

## 2020-08-16 DIAGNOSIS — D2261 Melanocytic nevi of right upper limb, including shoulder: Secondary | ICD-10-CM | POA: Diagnosis not present

## 2020-08-16 DIAGNOSIS — D1801 Hemangioma of skin and subcutaneous tissue: Secondary | ICD-10-CM | POA: Diagnosis not present

## 2020-08-16 DIAGNOSIS — L821 Other seborrheic keratosis: Secondary | ICD-10-CM | POA: Diagnosis not present

## 2020-08-31 ENCOUNTER — Ambulatory Visit (INDEPENDENT_AMBULATORY_CARE_PROVIDER_SITE_OTHER): Payer: Medicare Other | Admitting: Psychology

## 2020-08-31 DIAGNOSIS — F331 Major depressive disorder, recurrent, moderate: Secondary | ICD-10-CM | POA: Diagnosis not present

## 2020-09-01 DIAGNOSIS — M62838 Other muscle spasm: Secondary | ICD-10-CM | POA: Diagnosis not present

## 2020-09-01 DIAGNOSIS — M4726 Other spondylosis with radiculopathy, lumbar region: Secondary | ICD-10-CM | POA: Diagnosis not present

## 2020-09-01 DIAGNOSIS — Z79891 Long term (current) use of opiate analgesic: Secondary | ICD-10-CM | POA: Diagnosis not present

## 2020-09-01 DIAGNOSIS — G894 Chronic pain syndrome: Secondary | ICD-10-CM | POA: Diagnosis not present

## 2020-09-28 ENCOUNTER — Ambulatory Visit (INDEPENDENT_AMBULATORY_CARE_PROVIDER_SITE_OTHER): Payer: Medicare Other | Admitting: Psychology

## 2020-09-28 DIAGNOSIS — F331 Major depressive disorder, recurrent, moderate: Secondary | ICD-10-CM

## 2020-09-29 DIAGNOSIS — M62838 Other muscle spasm: Secondary | ICD-10-CM | POA: Diagnosis not present

## 2020-09-29 DIAGNOSIS — M4726 Other spondylosis with radiculopathy, lumbar region: Secondary | ICD-10-CM | POA: Diagnosis not present

## 2020-09-29 DIAGNOSIS — Z79891 Long term (current) use of opiate analgesic: Secondary | ICD-10-CM | POA: Diagnosis not present

## 2020-09-29 DIAGNOSIS — G894 Chronic pain syndrome: Secondary | ICD-10-CM | POA: Diagnosis not present

## 2020-10-26 ENCOUNTER — Ambulatory Visit (INDEPENDENT_AMBULATORY_CARE_PROVIDER_SITE_OTHER): Payer: Medicare Other | Admitting: Psychology

## 2020-10-26 DIAGNOSIS — F331 Major depressive disorder, recurrent, moderate: Secondary | ICD-10-CM | POA: Diagnosis not present

## 2020-10-27 DIAGNOSIS — M4726 Other spondylosis with radiculopathy, lumbar region: Secondary | ICD-10-CM | POA: Diagnosis not present

## 2020-10-27 DIAGNOSIS — Z79891 Long term (current) use of opiate analgesic: Secondary | ICD-10-CM | POA: Diagnosis not present

## 2020-10-27 DIAGNOSIS — G894 Chronic pain syndrome: Secondary | ICD-10-CM | POA: Diagnosis not present

## 2020-10-27 DIAGNOSIS — M62838 Other muscle spasm: Secondary | ICD-10-CM | POA: Diagnosis not present

## 2020-10-31 ENCOUNTER — Other Ambulatory Visit: Payer: Self-pay

## 2020-11-01 ENCOUNTER — Ambulatory Visit (INDEPENDENT_AMBULATORY_CARE_PROVIDER_SITE_OTHER): Payer: Medicare Other | Admitting: Family Medicine

## 2020-11-01 ENCOUNTER — Encounter: Payer: Self-pay | Admitting: Family Medicine

## 2020-11-01 VITALS — BP 110/82 | HR 104 | Temp 98.9°F | Resp 18 | Ht 65.0 in | Wt 213.8 lb

## 2020-11-01 DIAGNOSIS — I10 Essential (primary) hypertension: Secondary | ICD-10-CM

## 2020-11-01 DIAGNOSIS — R197 Diarrhea, unspecified: Secondary | ICD-10-CM

## 2020-11-01 DIAGNOSIS — Z0000 Encounter for general adult medical examination without abnormal findings: Secondary | ICD-10-CM | POA: Diagnosis not present

## 2020-11-01 DIAGNOSIS — G43809 Other migraine, not intractable, without status migrainosus: Secondary | ICD-10-CM | POA: Diagnosis not present

## 2020-11-01 DIAGNOSIS — E039 Hypothyroidism, unspecified: Secondary | ICD-10-CM | POA: Diagnosis not present

## 2020-11-01 MED ORDER — BUTALBITAL-APAP-CAFFEINE 50-325-40 MG PO TABS: ORAL_TABLET | ORAL | 0 refills | Status: DC

## 2020-11-01 MED ORDER — CILIDINIUM-CHLORDIAZEPOXIDE 2.5-5 MG PO CAPS: ORAL_CAPSULE | ORAL | 2 refills | Status: DC

## 2020-11-01 NOTE — Progress Notes (Signed)
Subjective:   By signing my name below, I, [name], attest that this documentation has been prepared under the direction and in the presence of Dr. Roma Schanz, DO. 11/01/2020      Patient ID: Karen Brown, female    DOB: 1961/04/19, 60 y.o.   MRN: [id number]  Chief Complaint  Patient presents with  . Annual Exam    Pt states not fasting     HPI Patient is in today for a comprehensive physical exam. She is doing well at this time. Her blood pressure during this visit was 110/82. Her heart rate was elevated during this visit. She reports hurting her back during [place] after [place] rode the [employer] ride. She has had chronic back pain prior to the accident and her pain management provider manages her pain. She denies having any fever, ear pain, congestion, sinus pain, sore throat, eye pain, chest pain, palpations, cough, SOB, wheezing, n/v/d, constipation, blood in stool, dysuria, frequency, hematuria, dizziness, or headaches at this time. She does not see a gynecologist at this time. She last ate a couple of hours ago due to having to take medication. Dr. Chalmers Cater manages her blood pressure and thyroid medication. She is UTD on Covid-19 vaccines and has 4 vaccines.  Pain management does her uds.     Past Medical History:  Diagnosis Date  . Anxiety   . Bipolar 1 disorder ([place])   . Chronic migraine   . Chronically dry eyes   . Colitis, ischemic ([place])   . Complication of anesthesia    versed does not work as stated per pt   . Depression   . Diverticulosis   . Dry mouth   . Family history of adverse reaction to anesthesia   . Fibromyalgia   . GERD (gastroesophageal reflux disease)   . Hemorrhoids   . Hyperlipemia   . Hypertension   . Hypothyroidism   . IBS (irritable bowel syndrome)   . PVC (premature ventricular contraction)     Past Surgical History:  Procedure Laterality Date  . ABDOMINAL HYSTERECTOMY     Partial   . APPENDECTOMY     . BACK SURGERY     Micro diskectomy  . BACK SURGERY     L facet rhzotomy  . CHOLECYSTECTOMY    . EXTRACORPOREAL SHOCK WAVE LITHOTRIPSY Left 03/19/2019   Procedure: EXTRACORPOREAL SHOCK WAVE LITHOTRIPSY (ESWL);  Surgeon: Ceasar Mons, MD;  Location: [facility];  Service: Urology;  Laterality: Left;  . TONSILLECTOMY      Family History  Problem Relation Age of Onset  . Kidney disease Father   . Coronary artery disease Father 52  . COPD Father 73       copd  . Heart disease Father   . Colon polyps Mother   . Osteoporosis Mother   . Colon cancer Neg Hx     Social History   Socioeconomic History  . Marital status: Married    Spouse name: Not on file  . Number of children: 0  . Years of education: Not on file  . Highest education level: Not on file  Occupational History  . Occupation: Disabled     Employer: [employer]  Tobacco Use  . Smoking status: Never Smoker  . Smokeless tobacco: Never Used  Vaping Use  . Vaping Use: Never used  Substance and Sexual Activity  . Alcohol use: No  . Drug use: No  . Sexual activity: Yes  Other Topics  Concern  . Not on file  Social History Narrative   Caffeine occ    Social Determinants of Health   Financial Resource Strain: Not on file  Food Insecurity: Not on file  Transportation Needs: Not on file  Physical Activity: Not on file  Stress: Not on file  Social Connections: Not on file  Intimate Partner Violence: Not on file    Outpatient Medications Prior to Visit  Medication Sig Dispense Refill  . CANNABIDIOL PO Take 0.75 mLs by mouth daily.     . clonazePAM (KLONOPIN) 1 MG tablet Take 1 mg by mouth 3 (three) times daily as needed.    . fentaNYL (DURAGESIC - DOSED MCG/HR) 75 MCG/HR Place 1 patch onto the skin every other day.    . hydrochlorothiazide (HYDRODIURIL) 25 MG tablet 1 tablet in the morning    . lamoTRIgine (LAMICTAL) 25 MG tablet Take 25 mg by mouth 2 (two) times daily. 1 in the morning and 1 in the  evening    . levothyroxine (SYNTHROID) 150 MCG tablet Take 1 tablet (150 mcg total) by mouth daily before breakfast. 30 tablet 2  . lisinopril (PRINIVIL,ZESTRIL) 20 MG tablet Take 1 tablet by mouth daily.    Marland Kitchen OVER THE COUNTER MEDICATION Take 3 tablets by mouth daily. cartilast    . oxyCODONE-acetaminophen (PERCOCET) 10-325 MG tablet Take 1 tablet by mouth daily. Will take up to 6 tabs a day  0  . PRISTIQ 100 MG 24 hr tablet Take 1 tablet by mouth daily.    . promethazine (PHENERGAN) 25 MG tablet Take 1 tablet (25 mg total) by mouth as needed. 90 tablet 0  . tiZANidine (ZANAFLEX) 4 MG capsule Take 4 mg by mouth 3 (three) times daily as needed for muscle spasms.    . butalbital-acetaminophen-caffeine (FIORICET) 50-325-40 MG tablet 1 po q4h prn 20 tablet 0  . clidinium-chlordiazePOXIDE (LIBRAX) 5-2.5 MG per capsule TAKE ONE CAPSULE BY MOUTH 3 TIMES A DAY BEFORE MEALS AS NEEDED (Patient not taking: No sig reported) 90 capsule 2  . nitrofurantoin, macrocrystal-monohydrate, (MACROBID) 100 MG capsule Take 1 capsule (100 mg total) by mouth 2 (two) times daily. (Patient not taking: No sig reported) 14 capsule 0  . rosuvastatin (CRESTOR) 10 MG tablet Take 1 tablet (10 mg total) by mouth at bedtime. (Patient not taking: No sig reported) 30 tablet 2   No facility-administered medications prior to visit.   Health Maintenance  Topic Date Due  . COLONOSCOPY (Pts 45-21yrs Insurance coverage will need to be confirmed)  12/10/2019  . Hepatitis C Screening  07/05/2023 (Originally [birth year]/1977)  . HIV Screening  07/05/2023 (Originally 03/11/1974)  . INFLUENZA VACCINE  01/09/2021  . MAMMOGRAM  07/13/2021  . TETANUS/TDAP  07/05/2027  . COVID-19 Vaccine  Completed  . HPV VACCINES  Aged Out   Allergies  Allergen Reactions  . Other Other (See Comments)    Oral steroids; skin sensitivity  . Biaxin [Clarithromycin] Other (See Comments)    Bad taste and ringing in ear with dec hearing  . Clindamycin/Lincomycin  Dermatitis  . Ketorolac Tromethamine   . Pb-Hyoscy-Atropine-Scopolamine   . Penicillins     REACTION: HIVES  . Phenobarbital   . Sulfonamide Derivatives   . Topiramate   . Zaleplon     Review of Systems  Constitutional: Negative for chills, fever and malaise/fatigue.  HENT: Negative for congestion, ear pain, hearing loss, sinus pain and sore throat.   Eyes: Negative for pain and discharge.  Respiratory: Negative for cough,  sputum production, shortness of breath and wheezing.   Cardiovascular: Negative for chest pain, palpitations and leg swelling.  Gastrointestinal: Negative for abdominal pain, blood in stool, constipation, diarrhea, heartburn, nausea and vomiting.  Genitourinary: Negative for dysuria, frequency, hematuria and urgency.  Musculoskeletal: Negative for back pain, falls and myalgias.  Skin: Negative for rash.  Neurological: Negative for dizziness, sensory change, loss of consciousness, weakness and headaches.  Endo/Heme/Allergies: Negative for environmental allergies. Does not bruise/bleed easily.  Psychiatric/Behavioral: Negative for depression and suicidal ideas. The patient is not nervous/anxious and does not have insomnia.        Objective:    Physical Exam Vitals and nursing note reviewed.  Constitutional:      General: She is not in acute distress.    Appearance: Normal appearance. She is well-developed. She is not ill-appearing.  HENT:     Head: Normocephalic and atraumatic.     Right Ear: Tympanic membrane and external ear normal.     Left Ear: Tympanic membrane and external ear normal.     Nose: Nose normal.  Eyes:     Extraocular Movements: Extraocular movements intact.     Pupils: Pupils are equal, round, and reactive to light.  Cardiovascular:     Rate and Rhythm: Normal rate and regular rhythm.     Pulses: Normal pulses.     Heart sounds: Normal heart sounds. No murmur heard. No gallop.   Pulmonary:     Effort: Pulmonary effort is normal. No  respiratory distress.     Breath sounds: Normal breath sounds. No wheezing, rhonchi or rales.  Chest:     Chest wall: No tenderness.  Abdominal:     General: Bowel sounds are normal. There is no distension.     Palpations: Abdomen is soft. There is no mass.     Tenderness: There is no abdominal tenderness. There is no guarding or rebound.     Hernia: No hernia is present.  Musculoskeletal:     Cervical back: Normal range of motion and neck supple.     Comments: Chronic back pain unchanged  Lymphadenopathy:     Cervical: No cervical adenopathy.  Skin:    General: Skin is warm and dry.  Neurological:     Mental Status: She is alert and oriented to person, place, and time.  Psychiatric:        Behavior: Behavior normal.        Thought Content: Thought content normal.        Judgment: Judgment normal.     BP 110/82 (BP Location: Right Arm, Patient Position: Sitting, Cuff Size: Large)   Pulse (!) 104   Temp 98.9 F (37.2 C) (Oral)   Resp 18   Ht 5\' 5"  (1.651 m)   Wt 213 lb 12.8 oz (97 kg)   SpO2 96%   BMI 35.58 kg/m  Wt Readings from Last 3 Encounters:  11/01/20 213 lb 12.8 oz (97 kg)  02/26/19 221 lb 3.2 oz (100.3 kg)  02/17/19 217 lb 9.6 oz (98.7 kg)    Diabetic Foot Exam - Simple   No data filed    Lab Results  Component Value Date   WBC 10.0 02/26/2019   HGB 14.7 02/26/2019   HCT 45.0 02/26/2019   PLT 340.0 02/26/2019   GLUCOSE 101 (H) 02/26/2019   CHOL 327 (H) 02/26/2019   TRIG (H) 02/26/2019    517.0 Triglyceride is over 400; calculations on Lipids are invalid.   HDL 52.10 02/26/2019   LDLDIRECT  210.0 02/26/2019   LDLCALC 190 (H) 06/27/2015   ALT 25 02/26/2019   AST 23 02/26/2019   NA 143 02/26/2019   K 4.2 02/26/2019   CL 101 02/26/2019   CREATININE 0.90 02/26/2019   BUN 12 02/26/2019   CO2 30 02/26/2019   TSH 4.67 (H) 02/26/2019   HGBA1C 5.4 08/15/2007   MICROALBUR <0.7 02/26/2019    Lab Results  Component Value Date   TSH 4.67 (H)  02/26/2019   Lab Results  Component Value Date   WBC 10.0 02/26/2019   HGB 14.7 02/26/2019   HCT 45.0 02/26/2019   MCV 87.5 02/26/2019   PLT 340.0 02/26/2019   Lab Results  Component Value Date   NA 143 02/26/2019   K 4.2 02/26/2019   CO2 30 02/26/2019   GLUCOSE 101 (H) 02/26/2019   BUN 12 02/26/2019   CREATININE 0.90 02/26/2019   BILITOT 0.3 02/26/2019   ALKPHOS 132 (H) 02/26/2019   AST 23 02/26/2019   ALT 25 02/26/2019   PROT 7.8 02/26/2019   ALBUMIN 4.9 02/26/2019   CALCIUM 10.3 02/26/2019   GFR 63.88 02/26/2019   Lab Results  Component Value Date   CHOL 327 (H) 02/26/2019   Lab Results  Component Value Date   HDL 52.10 02/26/2019   Lab Results  Component Value Date   LDLCALC 190 (H) 06/27/2015   Lab Results  Component Value Date   TRIG (H) 02/26/2019    517.0 Triglyceride is over 400; calculations on Lipids are invalid.   Lab Results  Component Value Date   CHOLHDL 6 02/26/2019   Lab Results  Component Value Date   HGBA1C 5.4 08/15/2007   Mammogram- Last completed 07/13/2020. Results normal. Repeat in 1 year.  Dexa- Last completed 07/12/2016. Results showed osteopenic. Repeat in 2 years.  Colonoscopy- Last completed 12/09/2009. Repeat in 10 years.  Pap Smear- Last completed 02/10/2010. Results normal.     Assessment & Plan:   Problem List Items Addressed This Visit      Unprioritized   Essential hypertension    Well controlled, no changes to meds. Encouraged heart healthy diet such as the DASH diet and exercise as tolerated.       Relevant Medications   hydrochlorothiazide (HYDRODIURIL) 25 MG tablet   Hypothyroidism    Per endo      Preventative health care    ghm utd  Check labs  See avs      Relevant Orders   Lipid panel   Comprehensive metabolic panel    Other Visit Diagnoses    Primary hypertension    -  Primary   Relevant Medications   hydrochlorothiazide (HYDRODIURIL) 25 MG tablet   Other Relevant Orders   Lipid panel    Comprehensive metabolic panel   Other migraine without status migrainosus, not intractable       Relevant Medications   hydrochlorothiazide (HYDRODIURIL) 25 MG tablet   butalbital-acetaminophen-caffeine (FIORICET) 50-325-40 MG tablet   Diarrhea       Relevant Medications   clidinium-chlordiazePOXIDE (LIBRAX) 5-2.5 MG capsule       Meds ordered this encounter  Medications  . butalbital-acetaminophen-caffeine (FIORICET) 50-325-40 MG tablet    Sig: 1 po q4h prn    Dispense:  20 tablet    Refill:  0  . clidinium-chlordiazePOXIDE (LIBRAX) 5-2.5 MG capsule    Sig: TAKE ONE CAPSULE BY MOUTH 3 TIMES A DAY BEFORE MEALS AS NEEDED    Dispense:  90 capsule    Refill:  2    I, Dr. Roma Schanz, DO, personally preformed the services described in this documentation.  All medical record entries made by the scribe were at my direction and in my presence.  I have reviewed the chart and discharge instructions (if applicable) and agree that the record reflects my personal performance and is accurate and complete. 11/01/2020   I,[name],acting as a scribe for Ann Held, DO.,have documented all relevant documentation on the behalf of Ann Held, DO,as directed by  Ann Held, DO while in the presence of Ann Held, DO.   Ann Held, DO

## 2020-11-01 NOTE — Assessment & Plan Note (Signed)
Per endo °

## 2020-11-01 NOTE — Assessment & Plan Note (Signed)
ghm utd Check labs  See avs  

## 2020-11-01 NOTE — Patient Instructions (Addendum)
Preventive Care 84-62 Years Old, Female Preventive care refers to lifestyle choices and visits with your health care provider that can promote health and wellness. This includes:  A yearly physical exam. This is also called an annual wellness visit.  Regular dental and eye exams.  Immunizations.  Screening for certain conditions.  Healthy lifestyle choices, such as: ? Eating a healthy diet. ? Getting regular exercise. ? Not using drugs or products that contain nicotine and tobacco. ? Limiting alcohol use. What can I expect for my preventive care visit? Physical exam Your health care provider will check your:  Height and weight. These may be used to calculate your BMI (body mass index). BMI is a measurement that tells if you are at a healthy weight.  Heart rate and blood pressure.  Body temperature.  Skin for abnormal spots. Counseling Your health care provider may ask you questions about your:  Past medical problems.  Family's medical history.  Alcohol, tobacco, and drug use.  Emotional well-being.  Home life and relationship well-being.  Sexual activity.  Diet, exercise, and sleep habits.  Work and work Statistician.  Access to firearms.  Method of birth control.  Menstrual cycle.  Pregnancy history. What immunizations do I need? Vaccines are usually given at various ages, according to a schedule. Your health care provider will recommend vaccines for you based on your age, medical history, and lifestyle or other factors, such as travel or where you work.   What tests do I need? Blood tests  Lipid and cholesterol levels. These may be checked every 5 years, or more often if you are over 3 years old.  Hepatitis C test.  Hepatitis B test. Screening  Lung cancer screening. You may have this screening every year starting at age 73 if you have a 30-pack-year history of smoking and currently smoke or have quit within the past 15 years.  Colorectal cancer  screening. ? All adults should have this screening starting at age 52 and continuing until age 17. ? Your health care provider may recommend screening at age 49 if you are at increased risk. ? You will have tests every 1-10 years, depending on your results and the type of screening test.  Diabetes screening. ? This is done by checking your blood sugar (glucose) after you have not eaten for a while (fasting). ? You may have this done every 1-3 years.  Mammogram. ? This may be done every 1-2 years. ? Talk with your health care provider about when you should start having regular mammograms. This may depend on whether you have a family history of breast cancer.  BRCA-related cancer screening. This may be done if you have a family history of breast, ovarian, tubal, or peritoneal cancers.  Pelvic exam and Pap test. ? This may be done every 3 years starting at age 10. ? Starting at age 11, this may be done every 5 years if you have a Pap test in combination with an HPV test. Other tests  STD (sexually transmitted disease) testing, if you are at risk.  Bone density scan. This is done to screen for osteoporosis. You may have this scan if you are at high risk for osteoporosis. Talk with your health care provider about your test results, treatment options, and if necessary, the need for more tests. Follow these instructions at home: Eating and drinking  Eat a diet that includes fresh fruits and vegetables, whole grains, lean protein, and low-fat dairy products.  Take vitamin and mineral supplements  as recommended by your health care provider.  Do not drink alcohol if: ? Your health care provider tells you not to drink. ? You are pregnant, may be pregnant, or are planning to become pregnant.  If you drink alcohol: ? Limit how much you have to 0-1 drink a day. ? Be aware of how much alcohol is in your drink. In the [place] one drink equals one 12 oz bottle of beer (355 mL), one 5 oz glass of  wine (148 mL), or one 1 oz glass of hard liquor (44 mL).   Lifestyle  Take daily care of your teeth and gums. Brush your teeth every morning and night with fluoride toothpaste. Floss one time each day.  Stay active. Exercise for at least 30 minutes 5 or more days each week.  Do not use any products that contain nicotine or tobacco, such as cigarettes, e-cigarettes, and chewing tobacco. If you need help quitting, ask your health care provider.  Do not use drugs.  If you are sexually active, practice safe sex. Use a condom or other form of protection to prevent STIs (sexually transmitted infections).  If you do not wish to become pregnant, use a form of birth control. If you plan to become pregnant, see your health care provider for a prepregnancy visit.  If told by your health care provider, take low-dose aspirin daily starting at age 60.  Find healthy ways to cope with stress, such as: ? Meditation, yoga, or listening to music. ? Journaling. ? Talking to a trusted person. ? Spending time with friends and family. Safety  Always wear your seat belt while driving or riding in a vehicle.  Do not drive: ? If you have been drinking alcohol. Do not ride with someone who has been drinking. ? When you are tired or distracted. ? While texting.  Wear a helmet and other protective equipment during sports activities.  If you have firearms in your house, make sure you follow all gun safety procedures. What's next?  Visit your health care provider once a year for an annual wellness visit.  Ask your health care provider how often you should have your eyes and teeth checked.  Stay +p to date on all vaccines. This information is not intended to replace advice given to you by your health care provider. Make sure you discuss any questions you have with your health care provider. Document Revised: 03/01/2020 Document Reviewed: 02/06/2018 Elsevier Patient Education  2021 [employer].

## 2020-11-01 NOTE — Assessment & Plan Note (Signed)
Well controlled, no changes to meds. Encouraged heart healthy diet such as the DASH diet and exercise as tolerated.  °

## 2020-11-02 LAB — COMPREHENSIVE METABOLIC PANEL
ALT: 17 U/L (ref 0–35)
AST: 17 U/L (ref 0–37)
Albumin: 4.8 g/dL (ref 3.5–5.2)
Alkaline Phosphatase: 95 U/L (ref 39–117)
BUN: 25 mg/dL — ABNORMAL HIGH (ref 6–23)
CO2: 32 mEq/L (ref 19–32)
Calcium: 10.3 mg/dL (ref 8.4–10.5)
Chloride: 96 mEq/L (ref 96–112)
Creatinine, Ser: 1.18 mg/dL (ref 0.40–1.20)
GFR: 49.8 mL/min — ABNORMAL LOW (ref >60.00)
Glucose, Bld: 88 mg/dL (ref 70–99)
Potassium: 4.3 mEq/L (ref 3.5–5.1)
Sodium: 137 mEq/L (ref 135–145)
Total Bilirubin: 0.3 mg/dL (ref 0.2–1.2)
Total Protein: 8 g/dL (ref 6.0–8.3)

## 2020-11-02 LAB — LIPID PANEL
Cholesterol: 356 mg/dL — ABNORMAL HIGH (ref 0–200)
HDL: 59.1 mg/dL (ref >39.00)
Total CHOL/HDL Ratio: 6
Triglycerides: 520 mg/dL — ABNORMAL HIGH (ref 0.0–149.0)

## 2020-11-02 LAB — LDL CHOLESTEROL, DIRECT: Direct LDL: 223 mg/dL

## 2020-11-03 NOTE — Telephone Encounter (Signed)
That is fine-- Dr Silverio Decamp is really nice and she is a great GI dr.

## 2020-11-12 IMAGING — CT CT RENAL STONE PROTOCOL
2 of 4 series · 16 of 46 positions shown, 18 images · non-contrast
Comparison: October 22, 2012

CLINICAL DATA: Flank pain.

EXAM:
CT ABDOMEN AND PELVIS WITHOUT CONTRAST
TECHNIQUE: Multidetector CT imaging of the abdomen and pelvis was performed
following the standard protocol without IV contrast.

[Series 2: axial st · axial · 0.98mm/px · z∈[-498,-34]mm · 13 of 103 slices shown, 15 images]
[im 5/103  soft-tissue]
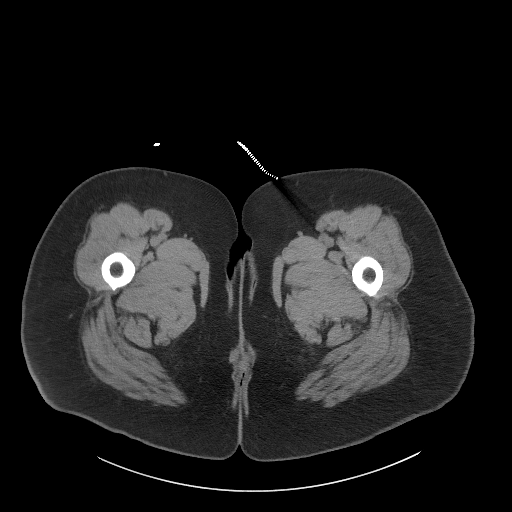
[im 5/103  bone]
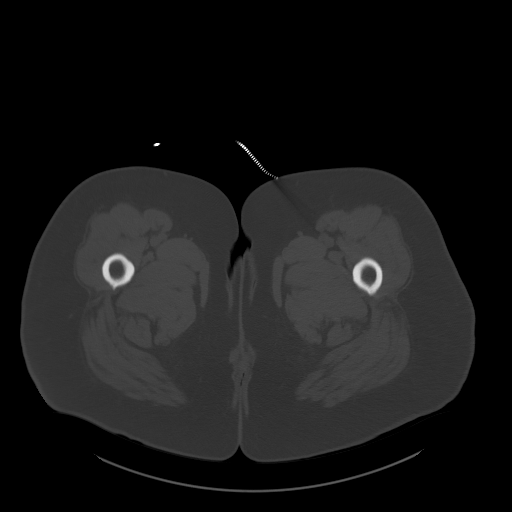
[im 13/103  soft-tissue]
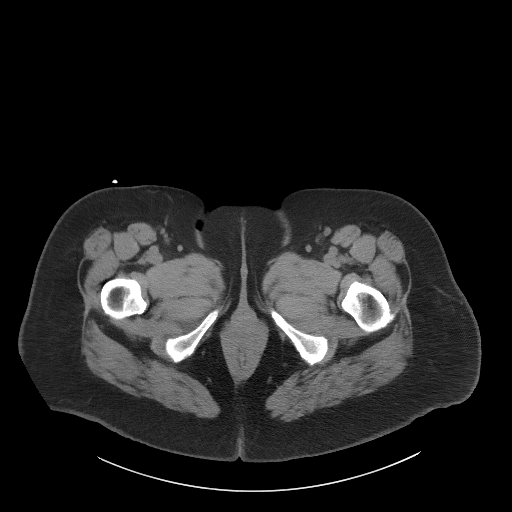
[im 22/103  soft-tissue]
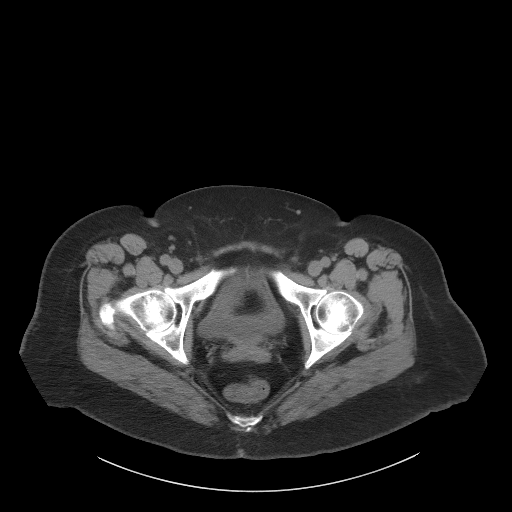
[im 30/103  soft-tissue]
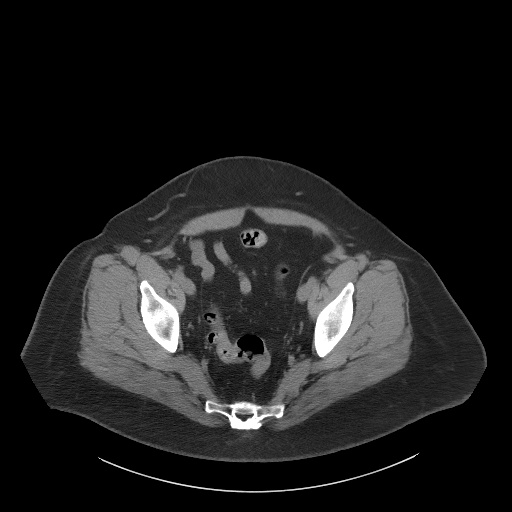
[im 35/103  soft-tissue]
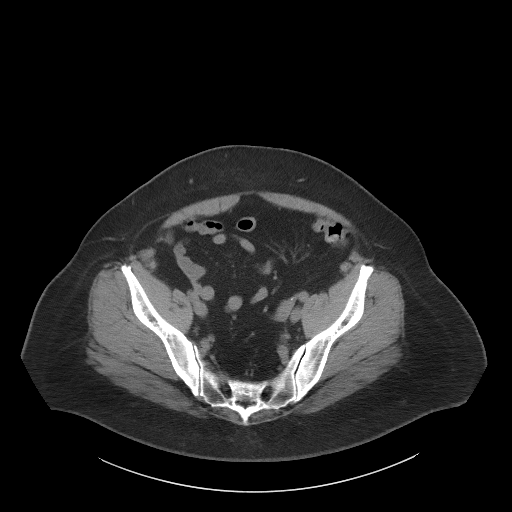
[im 43/103  soft-tissue]
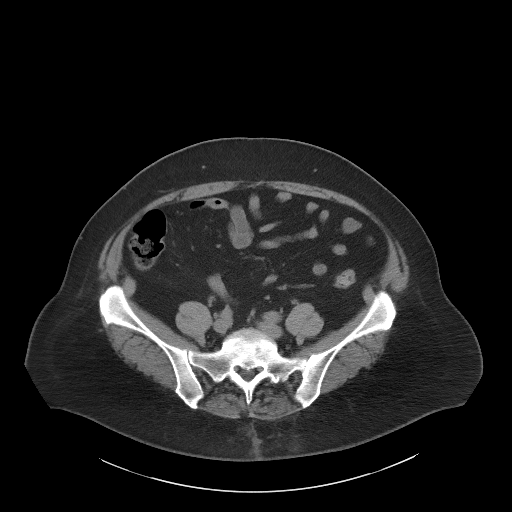
[im 52/103  soft-tissue]
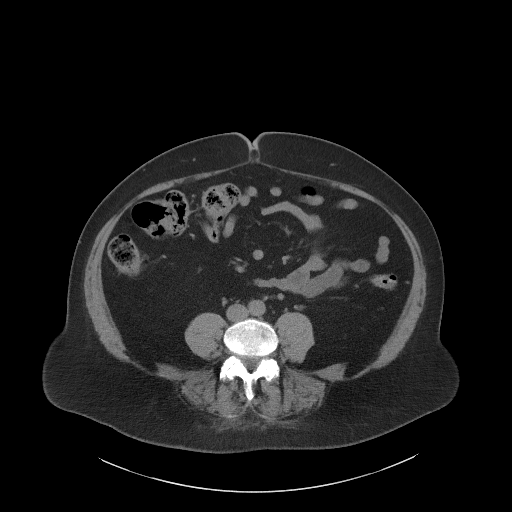
[im 60/103  soft-tissue]
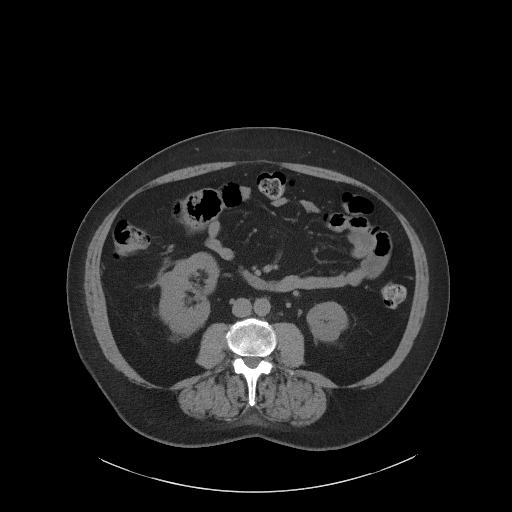
[im 69/103  soft-tissue]
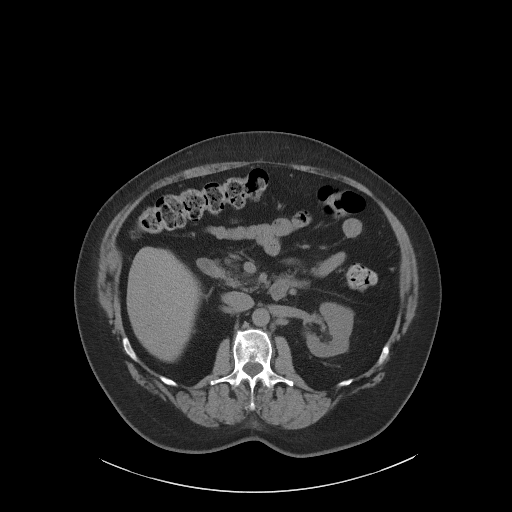
[im 69/103  bone]
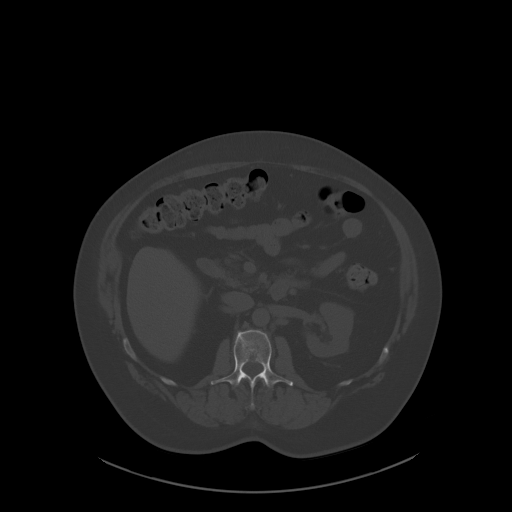
[im 73/103  soft-tissue]
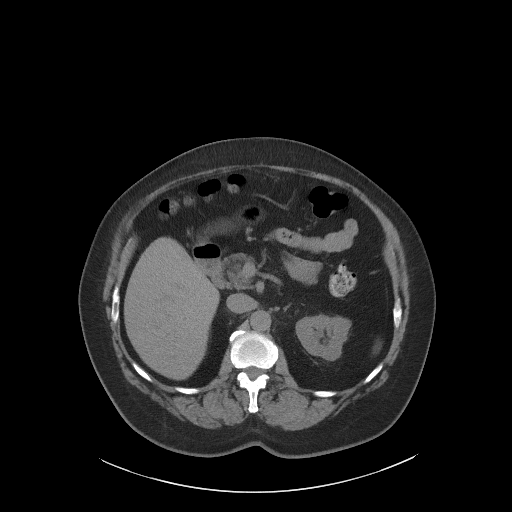
[im 81/103  soft-tissue]
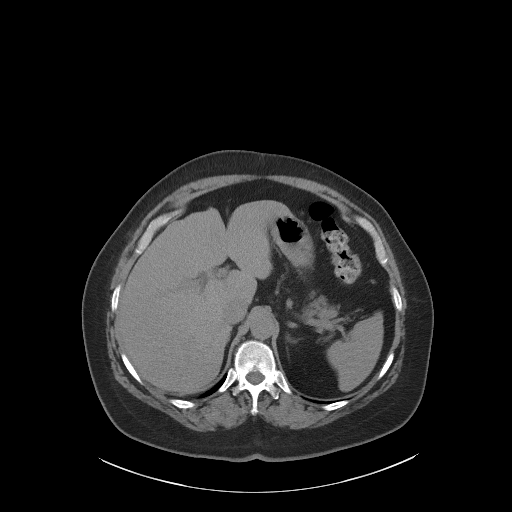
[im 90/103  soft-tissue]
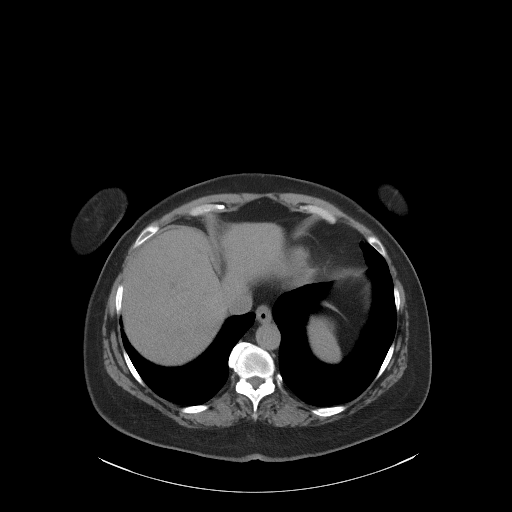
[im 98/103  soft-tissue]
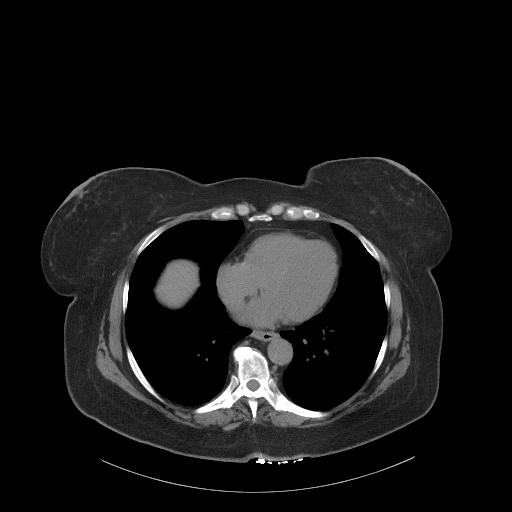

[Series 4: coronal st · coronal · 0.93mm/px · 3 of 114 slices shown]
[im 38/114  soft-tissue]
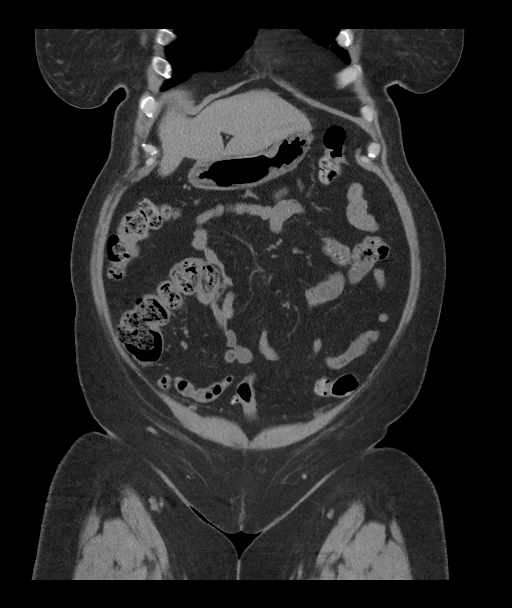
[im 51/114  soft-tissue]
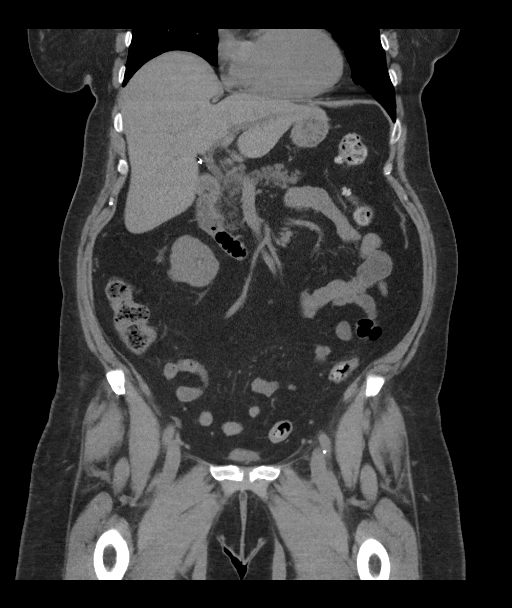
[im 63/114  soft-tissue]
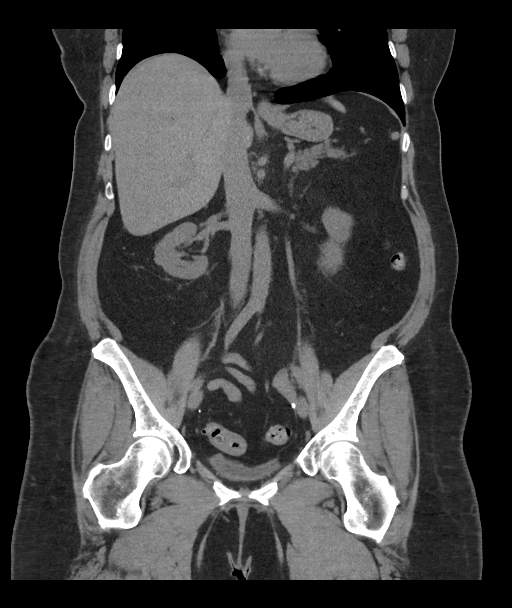

[16 of 46 positions shown; findings below may reference images not displayed]

FINDINGS: Lower chest: Atelectasis versus scarring in the lingula.

Hepatobiliary: No focal liver abnormality is seen. Status post
cholecystectomy. No biliary dilatation.

Pancreas: Unremarkable. No pancreatic ductal dilatation or
surrounding inflammatory changes.

Spleen: Normal in size without focal abnormality.

Adrenals/Urinary Tract: Normal adrenal glands. Bilateral perinephric
stranding. Normal appearance of the bilateral ureters and urinary
bladder. Nonobstructive lower pole left renal calculus measures
cm.

Stomach/Bowel: Stomach is within normal limits. No evidence of
appendicitis. No evidence of bowel wall thickening, distention, or
inflammatory changes.

Vascular/Lymphatic: No significant vascular findings are present. No
enlarged abdominal or pelvic lymph nodes.

Reproductive: Status post hysterectomy. No adnexal masses.

Other: Less than 1 cm periumbilical fat containing anterior
abdominal wall hernia. No abdominopelvic ascites.

Musculoskeletal: Spondylosis of the spine.
IMPRESSION: 1. Bilateral perinephric stranding without evidence of obstructive
uropathy.
2. Nonobstructive 1.2 cm lower pole left renal calculus.
3. Less than 1 cm periumbilical fat containing anterior abdominal
wall hernia.

## 2020-11-24 DIAGNOSIS — M4726 Other spondylosis with radiculopathy, lumbar region: Secondary | ICD-10-CM | POA: Diagnosis not present

## 2020-11-24 DIAGNOSIS — M62838 Other muscle spasm: Secondary | ICD-10-CM | POA: Diagnosis not present

## 2020-11-24 DIAGNOSIS — Z79891 Long term (current) use of opiate analgesic: Secondary | ICD-10-CM | POA: Diagnosis not present

## 2020-11-24 DIAGNOSIS — G894 Chronic pain syndrome: Secondary | ICD-10-CM | POA: Diagnosis not present

## 2020-12-13 ENCOUNTER — Ambulatory Visit (INDEPENDENT_AMBULATORY_CARE_PROVIDER_SITE_OTHER): Payer: Medicare Other

## 2020-12-13 VITALS — Ht 65.0 in | Wt 213.0 lb

## 2020-12-13 DIAGNOSIS — Z0000 Encounter for general adult medical examination without abnormal findings: Secondary | ICD-10-CM

## 2020-12-13 NOTE — Progress Notes (Signed)
Subjective:   Karen Brown is a 60 y.o. female who presents for Medicare Annual (Subsequent) preventive examination.  I connected with [name] today by telephone and verified that I am speaking with the correct person using two identifiers. Location patient: home Location provider: work Persons participating in the virtual visit: patient, Marine scientist.    I discussed the limitations, risks, security and privacy concerns of performing an evaluation and management service by telephone and the availability of in person appointments. I also discussed with the patient that there may be a patient responsible charge related to this service. The patient expressed understanding and verbally consented to this telephonic visit.    Interactive audio and video telecommunications were attempted between this provider and patient, however failed, due to patient having technical difficulties OR patient did not have access to video capability.  We continued and completed visit with audio only.  Some vital signs may be absent or patient reported.   Time Spent with patient on telephone encounter: 20 minutes   Review of Systems     Cardiac Risk Factors include: hypertension;dyslipidemia;sedentary lifestyle;obesity (BMI >30kg/m2)     Objective:    Today's Vitals   12/13/20 1422  Weight: 213 lb (96.6 kg)  Height: 5\' 5"  (1.651 m)  PainSc: 6    Body mass index is 35.45 kg/m.  Advanced Directives 12/13/2020 07/24/2019 03/19/2019 07/22/2018 07/04/2017 07/02/2016 06/28/2015  Does Patient Have a Medical Advance Directive? No No No No No No Yes  Type of Advance Directive - - - - - - Press photographer;Living will  Does patient want to make changes to medical advance directive? - - - - - - No - Patient declined  Copy of [place] in Chart? - - - - - - No - copy requested  Would patient like information on creating a medical advance directive? No - Patient declined No - Patient declined No  - Patient declined Yes (MAU/Ambulatory/Procedural Areas - Information given) Yes (MAU/Ambulatory/Procedural Areas - Information given) Yes (MAU/Ambulatory/Procedural Areas - Information given) -    Current Medications (verified) Outpatient Encounter Medications as of 12/13/2020  Medication Sig   butalbital-acetaminophen-caffeine (FIORICET) 50-325-40 MG tablet 1 po q4h prn   CANNABIDIOL PO Take 0.75 mLs by mouth daily.    clidinium-chlordiazePOXIDE (LIBRAX) 5-2.5 MG capsule TAKE ONE CAPSULE BY MOUTH 3 TIMES A DAY BEFORE MEALS AS NEEDED   clonazePAM (KLONOPIN) 1 MG tablet Take 1 mg by mouth 3 (three) times daily as needed.   fentaNYL (DURAGESIC) 50 MCG/HR Place onto the skin.   hydrochlorothiazide (HYDRODIURIL) 25 MG tablet 1 tablet in the morning   lamoTRIgine (LAMICTAL) 25 MG tablet Take 25 mg by mouth 2 (two) times daily. 1 in the morning and 1 in the evening   levothyroxine (SYNTHROID) 150 MCG tablet Take 1 tablet (150 mcg total) by mouth daily before breakfast.   lisinopril (PRINIVIL,ZESTRIL) 20 MG tablet Take 1 tablet by mouth daily.   OVER THE COUNTER MEDICATION Take 3 tablets by mouth daily. cartilast   oxyCODONE-acetaminophen (PERCOCET) 10-325 MG tablet Take 1 tablet by mouth daily. Will take up to 6 tabs a day   PRISTIQ 100 MG 24 hr tablet Take 1 tablet by mouth daily.   promethazine (PHENERGAN) 25 MG tablet Take 1 tablet (25 mg total) by mouth as needed.   tiZANidine (ZANAFLEX) 4 MG capsule Take 4 mg by mouth 3 (three) times daily as needed for muscle spasms.   [DISCONTINUED] fentaNYL (DURAGESIC - DOSED MCG/HR)  75 MCG/HR Place 1 patch onto the skin every other day.   No facility-administered encounter medications on file as of 12/13/2020.    Allergies (verified) Other, Biaxin [clarithromycin], Clindamycin/lincomycin, Ketorolac tromethamine, Pb-hyoscy-atropine-scopolamine, Penicillins, Phenobarbital, Sulfonamide derivatives, Topiramate, and Zaleplon   History: Past Medical History:   Diagnosis Date   Anxiety    Bipolar 1 disorder ([place])    Chronic migraine    Chronically dry eyes    Colitis, ischemic ([place])    Complication of anesthesia    versed does not work as stated per pt    Depression    Diverticulosis    Dry mouth    Family history of adverse reaction to anesthesia    Fibromyalgia    GERD (gastroesophageal reflux disease)    Hemorrhoids    Hyperlipemia    Hypertension    Hypothyroidism    IBS (irritable bowel syndrome)    PVC (premature ventricular contraction)    Past Surgical History:  Procedure Laterality Date   ABDOMINAL HYSTERECTOMY     Partial    APPENDECTOMY     BACK SURGERY     Micro diskectomy   BACK SURGERY     L facet rhzotomy   CHOLECYSTECTOMY     EXTRACORPOREAL SHOCK WAVE LITHOTRIPSY Left 03/19/2019   Procedure: EXTRACORPOREAL SHOCK WAVE LITHOTRIPSY (ESWL);  Surgeon: Ceasar Mons, MD;  Location: [facility];  Service: Urology;  Laterality: Left;   TONSILLECTOMY     Family History  Problem Relation Age of Onset   Kidney disease Father    Coronary artery disease Father 9   COPD Father 86       copd   Heart disease Father    Colon polyps Mother    Osteoporosis Mother    Colon cancer Neg Hx    Social History   Socioeconomic History   Marital status: Married    Spouse name: Not on file   Number of children: 0   Years of education: Not on file   Highest education level: Not on file  Occupational History   Occupation: Disabled     Employer: [employer]  Tobacco Use   Smoking status: Never   Smokeless tobacco: Never  Vaping Use   Vaping Use: Never used  Substance and Sexual Activity   Alcohol use: No   Drug use: No   Sexual activity: Yes  Other Topics Concern   Not on file  Social History Narrative   Caffeine occ    Social Determinants of Health   Financial Resource Strain: Low Risk    Difficulty of Paying Living Expenses: Not hard at all  Food Insecurity: No Food Insecurity   Worried About Ship broker in the Last Year: Never true   [place] in the Last Year: Never true  Transportation Needs: No Transportation Needs   Lack of Transportation (Medical): No   Lack of Transportation (Non-Medical): No  Physical Activity: Inactive   Days of Exercise per Week: 0 days   Minutes of Exercise per Session: 0 min  Stress: No Stress Concern Present   Feeling of Stress : Only a little  Social Connections: Moderately Integrated   Frequency of Communication with Friends and Family: More than three times a week   Frequency of Social Gatherings with Friends and Family: Once a week   Attends Religious Services: More than 4 times per year   Active Member of [employer] or Organizations: No   Attends Archivist Meetings: Never  Marital Status: Married    Tobacco Counseling Counseling given: Not Answered   Clinical Intake:  Pre-visit preparation completed: Yes  Pain : 0-10 Pain Score: 6  Pain Type: Chronic pain Pain Location: Back (arthritis) Pain Orientation: Lower, Mid Pain Onset: More than a month ago Pain Frequency: Constant     Nutritional Status: BMI > 30  Obese Nutritional Risks: None Diabetes: No  How often do you need to have someone help you when you read instructions, pamphlets, or other written materials from your doctor or pharmacy?: 1 - Never  Diabetic?No  Interpreter Needed?: No  Information entered by :: Caroleen Hamman LPN   Activities of Daily Living In your present state of health, do you have any difficulty performing the following activities: 12/13/2020  Hearing? N  Vision? N  Difficulty concentrating or making decisions? N  Walking or climbing stairs? N  Dressing or bathing? N  Doing errands, shopping? N  Preparing Food and eating ? N  Using the Toilet? N  In the past six months, have you accidently leaked urine? N  Do you have problems with loss of bowel control? N  Managing your Medications? N  Managing your Finances? N  Housekeeping  or managing your Housekeeping? N  Some recent data might be hidden    Patient Care Team: Carollee Herter, Alferd Apa, DO as PCP - General Nicholaus Bloom, MD as Consulting Physician (Anesthesiology) Jacelyn Pi, MD as Consulting Physician (Endocrinology) Phylliss Bob, MD as Consulting Physician (Orthopedic Surgery)  Indicate any recent Medical Services you may have received from other than [facility] providers in the past year (date may be approximate).     Assessment:   This is a routine wellness examination for Karen Brown.  Hearing/Vision screen Hearing Screening - Comments:: No issues Vision Screening - Comments:: Wears glasses Last eye exam-07/2020-[facility]  Dietary issues and exercise activities discussed: Current Exercise Habits: The patient does not participate in regular exercise at present, Exercise limited by: None identified   Goals Addressed             This Visit's Progress    Increase physical activity   Not on track      Depression Screen PHQ 2/9 Scores 12/13/2020 07/24/2019 07/22/2018 07/22/2018 07/04/2017 07/02/2016 06/28/2015  PHQ - 2 Score 1 0 4 1 1  0 0  PHQ- 9 Score - - 11 - - - -    Fall Risk Fall Risk  12/13/2020 07/24/2019 07/22/2018 07/04/2017 07/02/2016  Falls in the past year? 0 0 0 Yes No  Number falls in past yr: 0 0 - 1 -  Injury with Fall? 0 0 - - -  Follow up Falls prevention discussed Education provided;Falls prevention discussed - Education provided;Falls prevention discussed -    FALL RISK PREVENTION PERTAINING TO THE HOME:  Any stairs in or around the home? Yes  If so, are there any without handrails? No  Home free of loose throw rugs in walkways, pet beds, electrical cords, etc? Yes  Adequate lighting in your home to reduce risk of falls? Yes   ASSISTIVE DEVICES UTILIZED TO PREVENT FALLS:  Life alert? No  Use of a cane, walker or w/c? No  Grab bars in the bathroom? No  Shower chair or bench in shower? Yes  Elevated toilet seat or a  handicapped toilet? No   TIMED UP AND GO:  Was the test performed? No . Phone visit   Cognitive Function:Normal cognitive status assessed by  this Nurse Health Advisor. No  abnormalities found.   MMSE - Mini Mental State Exam 07/02/2016  Orientation to time 5  Orientation to Place 5  Registration 3  Attention/ Calculation 5  Recall 3  Language- name 2 objects 2  Language- repeat 1  Language- follow 3 step command 3  Language- read & follow direction 1  Write a sentence 1  Copy design 1  Total score 30        Immunizations Immunization History  Administered Date(s) Administered   Influenza Inj Mdck Quad Pf 04/04/2017   Influenza Split 04/09/2011, 04/03/2012   Influenza Whole 03/15/2010   Influenza,inj,Quad PF,6+ Mos 04/07/2013, 03/26/2014, 03/22/2015, 04/13/2016, 02/26/2019   Influenza-Unspecified 04/04/2017, 04/09/2018   PFIZER(Purple Top)SARS-COV-2 Vaccination 09/07/2019, 09/28/2019, 04/11/2020, 10/21/2020   Td 01/23/2007   Tdap 07/04/2017    TDAP status: Up to date  Flu Vaccine status: Up to date  Pneumococcal vaccine status: Not yet indicated  Covid-19 vaccine status: Completed vaccines  Qualifies for Shingles Vaccine? Yes   Zostavax completed No   Shingrix Completed?: No.    Education has been provided regarding the importance of this vaccine. Patient has been advised to call insurance company to determine out of pocket expense if they have not yet received this vaccine. Advised may also receive vaccine at local pharmacy or Health Dept. Verbalized acceptance and understanding.  Screening Tests Health Maintenance  Topic Date Due   Zoster Vaccines- Shingrix (1 of 2) Never done   COLONOSCOPY (Pts 45-57yrs Insurance coverage will need to be confirmed)  11/03/2021 (Originally 12/10/2019)   Hepatitis C Screening  07/05/2023 (Originally 03/11/1979)   HIV Screening  07/05/2023 (Originally [birth year]1974)   INFLUENZA VACCINE  01/09/2021   MAMMOGRAM  07/13/2021    TETANUS/TDAP  07/05/2027   COVID-19 Vaccine  Completed   Pneumococcal Vaccine 57-44 Years old  Aged Out   HPV VACCINES  Aged Out    Health Maintenance  Health Maintenance Due  Topic Date Due   Zoster Vaccines- Shingrix (1 of 2) Never done    Colorectal cancer screening: Due-Declined.  Mammogram status: Completed Bilateral 07/13/2020. Repeat every year  Bone Density status: Not yet indicated  Lung Cancer Screening: (Low Dose CT Chest recommended if Age 35-80 years, 30 pack-year currently smoking OR have quit w/in 15years.) does not qualify.     Additional Screening:  Hepatitis C Screening: does qualify; Declined  Vision Screening: Recommended annual ophthalmology exams for early detection of glaucoma and other disorders of the eye. Is the patient up to date with their annual eye exam?  Yes  Who is the provider or what is the name of the office in which the patient attends annual eye exams? [facility]    Dental Screening: Recommended annual dental exams for proper oral hygiene  Community Resource Referral / Chronic Care Management: CRR required this visit?  No   CCM required this visit?  No      Plan:     I have personally reviewed and noted the following in the patient's chart:   Medical and social history Use of alcohol, tobacco or illicit drugs  Current medications and supplements including opioid prescriptions.  Functional ability and status Nutritional status Physical activity Advanced directives List of other physicians Hospitalizations, surgeries, and ER visits in previous 12 months Vitals Screenings to include cognitive, depression, and falls Referrals and appointments  In addition, I have reviewed and discussed with patient certain preventive protocols, quality metrics, and best practice recommendations. A written personalized care plan for preventive services as well  as general preventive health recommendations were provided to patient.    Due to this being a telephonic visit, the after visit summary with patients personalized plan was offered to patient via mail or my-chart. Patient would like to access on my-chart.    Marta Antu, LPN   10/17/2761  Nurse Health Advisor  Nurse Notes: None

## 2020-12-13 NOTE — Patient Instructions (Signed)
Ms. Prien , Thank you for taking time to complete your Medicare Wellness Visit. I appreciate your ongoing commitment to your health goals. Please review the following plan we discussed and let me know if I can assist you in the future.   Screening recommendations/referrals: Colonoscopy: Due-Declined. Please call the office to schedule if you change your mind. Mammogram: Completed  07/13/2020-Due 07/13/2021 Bone Density: Not yet indicated. Due at age 60. Recommended yearly ophthalmology/optometry visit for glaucoma screening and checkup Recommended yearly dental visit for hygiene and checkup  Vaccinations: Influenza vaccine: Up to date Pneumococcal vaccine: Not yet indicated-Due at age 17. Tdap vaccine: Up to date-Due-07/05/2027 Shingles vaccine: Discuss with pharmacy  Covid-19: Up to date  Advanced directives: Please bring a copy for your chart when completed.  Conditions/risks identified: See problem list  Next appointment: Follow up in one year for your annual wellness visit.   Preventive Care 40-64 Years, Female Preventive care refers to lifestyle choices and visits with your health care provider that can promote health and wellness. What does preventive care include? A yearly physical exam. This is also called an annual well check. Dental exams once or twice a year. Routine eye exams. Ask your health care provider how often you should have your eyes checked. Personal lifestyle choices, including: Daily care of your teeth and gums. Regular physical activity. Eating a healthy diet. Avoiding tobacco and drug use. Limiting alcohol use. Practicing safe sex. Taking low-dose aspirin daily starting at age 33. Taking vitamin and mineral supplements as recommended by your health care provider. What happens during an annual well check? The services and screenings done by your health care provider during your annual well check will depend on your age, overall health, lifestyle risk factors,  and family history of disease. Counseling  Your health care provider may ask you questions about your: Alcohol use. Tobacco use. Drug use. Emotional well-being. Home and relationship well-being. Sexual activity. Eating habits. Work and work Statistician. Method of birth control. Menstrual cycle. Pregnancy history. Screening  You may have the following tests or measurements: Height, weight, and BMI. Blood pressure. Lipid and cholesterol levels. These may be checked every 5 years, or more frequently if you are over 76 years old. Skin check. Lung cancer screening. You may have this screening every year starting at age 88 if you have a 30-pack-year history of smoking and currently smoke or have quit within the past 15 years. Fecal occult blood test (FOBT) of the stool. You may have this test every year starting at age 64. Flexible sigmoidoscopy or colonoscopy. You may have a sigmoidoscopy every 5 years or a colonoscopy every 10 years starting at age 24. Hepatitis C blood test. Hepatitis B blood test. Sexually transmitted disease (STD) testing. Diabetes screening. This is done by checking your blood sugar (glucose) after you have not eaten for a while (fasting). You may have this done every 1-3 years. Mammogram. This may be done every 1-2 years. Talk to your health care provider about when you should start having regular mammograms. This may depend on whether you have a family history of breast cancer. BRCA-related cancer screening. This may be done if you have a family history of breast, ovarian, tubal, or peritoneal cancers. Pelvic exam and Pap test. This may be done every 3 years starting at age 71. Starting at age 35, this may be done every 5 years if you have a Pap test in combination with an HPV test. Bone density scan. This is done to screen  for osteoporosis. You may have this scan if you are at high risk for osteoporosis. Discuss your test results, treatment options, and if  necessary, the need for more tests with your health care provider. Vaccines  Your health care provider may recommend certain vaccines, such as: Influenza vaccine. This is recommended every year. Tetanus, diphtheria, and acellular pertussis (Tdap, Td) vaccine. You may need a Td booster every 10 years. Zoster vaccine. You may need this after age 37. Pneumococcal 13-valent conjugate (PCV13) vaccine. You may need this if you have certain conditions and were not previously vaccinated. Pneumococcal polysaccharide (PPSV23) vaccine. You may need one or two doses if you smoke cigarettes or if you have certain conditions. Talk to your health care provider about which screenings and vaccines you need and how often you need them. This information is not intended to replace advice given to you by your health care provider. Make sure you discuss any questions you have with your health care provider. Document Released: 06/24/2015 Document Revised: 02/15/2016 Document Reviewed: 03/29/2015 Elsevier Interactive Patient Education  2017 [employer] Prevention in the Home Falls can cause injuries. They can happen to people of all ages. There are many things you can do to make your home safe and to help prevent falls. What can I do on the outside of my home? Regularly fix the edges of walkways and driveways and fix any cracks. Remove anything that might make you trip as you walk through a door, such as a raised step or threshold. Trim any bushes or trees on the path to your home. Use bright outdoor lighting. Clear any walking paths of anything that might make someone trip, such as rocks or tools. Regularly check to see if handrails are loose or broken. Make sure that both sides of any steps have handrails. Any raised decks and porches should have guardrails on the edges. Have any leaves, snow, or ice cleared regularly. Use sand or salt on walking paths during winter. Clean up any spills in your  garage right away. This includes oil or grease spills. What can I do in the bathroom? Use night lights. Install grab bars by the toilet and in the tub and shower. Do not use towel bars as grab bars. Use non-skid mats or decals in the tub or shower. If you need to sit down in the shower, use a plastic, non-slip stool. Keep the floor dry. Clean up any water that spills on the floor as soon as it happens. Remove soap buildup in the tub or shower regularly. Attach bath mats securely with double-sided non-slip rug tape. Do not have throw rugs and other things on the floor that can make you trip. What can I do in the bedroom? Use night lights. Make sure that you have a light by your bed that is easy to reach. Do not use any sheets or blankets that are too big for your bed. They should not hang down onto the floor. Have a firm chair that has side arms. You can use this for support while you get dressed. Do not have throw rugs and other things on the floor that can make you trip. What can I do in the kitchen? Clean up any spills right away. Avoid walking on wet floors. Keep items that you use a lot in easy-to-reach places. If you need to reach something above you, use a strong step stool that has a grab bar. Keep electrical cords out of the way. Do  not use floor polish or wax that makes floors slippery. If you must use wax, use non-skid floor wax. Do not have throw rugs and other things on the floor that can make you trip. What can I do with my stairs? Do not leave any items on the stairs. Make sure that there are handrails on both sides of the stairs and use them. Fix handrails that are broken or loose. Make sure that handrails are as long as the stairways. Check any carpeting to make sure that it is firmly attached to the stairs. Fix any carpet that is loose or worn. Avoid having throw rugs at the top or bottom of the stairs. If you do have throw rugs, attach them to the floor with carpet  tape. Make sure that you have a light switch at the top of the stairs and the bottom of the stairs. If you do not have them, ask someone to add them for you. What else can I do to help prevent falls? Wear shoes that: Do not have high heels. Have rubber bottoms. Are comfortable and fit you well. Are closed at the toe. Do not wear sandals. If you use a stepladder: Make sure that it is fully opened. Do not climb a closed stepladder. Make sure that both sides of the stepladder are locked into place. Ask someone to hold it for you, if possible. Clearly mark and make sure that you can see: Any grab bars or handrails. First and last steps. Where the edge of each step is. Use tools that help you move around (mobility aids) if they are needed. These include: Canes. Walkers. Scooters. Crutches. Turn on the lights when you go into a dark area. Replace any light bulbs as soon as they burn out. Set up your furniture so you have a clear path. Avoid moving your furniture around. If any of your floors are uneven, fix them. If there are any pets around you, be aware of where they are. Review your medicines with your doctor. Some medicines can make you feel dizzy. This can increase your chance of falling. Ask your doctor what other things that you can do to help prevent falls. This information is not intended to replace advice given to you by your health care provider. Make sure you discuss any questions you have with your health care provider. Document Released: 03/24/2009 Document Revised: 11/03/2015 Document Reviewed: 07/02/2014 Elsevier Interactive Patient Education  2017 [employer].

## 2020-12-15 DIAGNOSIS — N200 Calculus of kidney: Secondary | ICD-10-CM | POA: Diagnosis not present

## 2020-12-15 DIAGNOSIS — E7800 Pure hypercholesterolemia, unspecified: Secondary | ICD-10-CM | POA: Diagnosis not present

## 2020-12-15 DIAGNOSIS — I10 Essential (primary) hypertension: Secondary | ICD-10-CM | POA: Diagnosis not present

## 2020-12-15 DIAGNOSIS — R7301 Impaired fasting glucose: Secondary | ICD-10-CM | POA: Diagnosis not present

## 2020-12-15 DIAGNOSIS — E039 Hypothyroidism, unspecified: Secondary | ICD-10-CM | POA: Diagnosis not present

## 2020-12-15 DIAGNOSIS — E559 Vitamin D deficiency, unspecified: Secondary | ICD-10-CM | POA: Diagnosis not present

## 2020-12-22 DIAGNOSIS — Z79891 Long term (current) use of opiate analgesic: Secondary | ICD-10-CM | POA: Diagnosis not present

## 2020-12-22 DIAGNOSIS — G894 Chronic pain syndrome: Secondary | ICD-10-CM | POA: Diagnosis not present

## 2020-12-22 DIAGNOSIS — M4726 Other spondylosis with radiculopathy, lumbar region: Secondary | ICD-10-CM | POA: Diagnosis not present

## 2020-12-22 DIAGNOSIS — M62838 Other muscle spasm: Secondary | ICD-10-CM | POA: Diagnosis not present

## 2021-01-22 ENCOUNTER — Ambulatory Visit: Payer: Medicare Other

## 2021-01-24 DIAGNOSIS — G894 Chronic pain syndrome: Secondary | ICD-10-CM | POA: Diagnosis not present

## 2021-01-24 DIAGNOSIS — Z79891 Long term (current) use of opiate analgesic: Secondary | ICD-10-CM | POA: Diagnosis not present

## 2021-01-24 DIAGNOSIS — M62838 Other muscle spasm: Secondary | ICD-10-CM | POA: Diagnosis not present

## 2021-01-24 DIAGNOSIS — M4726 Other spondylosis with radiculopathy, lumbar region: Secondary | ICD-10-CM | POA: Diagnosis not present

## 2021-01-29 ENCOUNTER — Encounter: Payer: Self-pay | Admitting: Emergency Medicine

## 2021-01-29 ENCOUNTER — Ambulatory Visit: Payer: Medicare Other

## 2021-01-29 ENCOUNTER — Emergency Department
Admission: EM | Admit: 2021-01-29 | Discharge: 2021-01-29 | Disposition: A | Discharge status: Home / Self Care | Payer: Medicare Other | Source: Home / Self Care | Attending: Family Medicine | Admitting: Family Medicine

## 2021-01-29 ENCOUNTER — Other Ambulatory Visit: Payer: Self-pay

## 2021-01-29 DIAGNOSIS — R399 Unspecified symptoms and signs involving the genitourinary system: Secondary | ICD-10-CM

## 2021-01-29 LAB — POCT URINALYSIS DIP (MANUAL ENTRY)
Bilirubin, UA: NEGATIVE
Glucose, UA: NEGATIVE mg/dL
Ketones, POC UA: NEGATIVE mg/dL
Nitrite, UA: NEGATIVE
Protein Ur, POC: NEGATIVE mg/dL
Spec Grav, UA: 1.01 (ref 1.010–1.025)
Urobilinogen, UA: 0.2 E.U./dL
pH, UA: 5 (ref 5.0–8.0)

## 2021-01-29 MED ORDER — CIPROFLOXACIN HCL 500 MG PO TABS: 500.0000 mg | ORAL_TABLET | Freq: Two times a day (BID) | ORAL | 0 refills | Status: DC

## 2021-01-29 NOTE — ED Provider Notes (Signed)
[facility]    CSN: [id number] Arrival date & time: 01/29/21  1251      History   Chief Complaint Chief Complaint  Patient presents with   Dysuria    HPI Karen Brown is a 60 y.o. female.   HPI  Very pleasant woman here for an appointment to be evaluated for hematuria.  She does have a history of kidney stones.  She has a history of urinary tract infections.  She states she noticed blood in her urine yesterday.  Today it is gone.  She is having mild suprapubic discomfort.  No real frequency or dysuria.  No flank pain.  No nausea or vomiting.  No fever or chills.  She has chronic back pain from known degenerative disc disease.  Has had surgeries and injections.  Also has fibromyalgia which causes her chronic pain.  She is under narcotic contract.  Past Medical History:  Diagnosis Date   Anxiety    Bipolar 1 disorder ([place])    Chronic migraine    Chronically dry eyes    Colitis, ischemic ([place])    Complication of anesthesia    versed does not work as stated per pt    Depression    Diverticulosis    Dry mouth    Family history of adverse reaction to anesthesia    Fibromyalgia    GERD (gastroesophageal reflux disease)    Hemorrhoids    Hyperlipemia    Hypertension    Hypothyroidism    IBS (irritable bowel syndrome)    PVC (premature ventricular contraction)     Patient Active Problem List   Diagnosis Date Noted   Right flank pain 02/17/2019   Preventative health care 07/02/2016   UTI symptoms 12/01/2014   Obesity (BMI 30-39.9) 02/25/2013   Migraine without aura 07/07/2009   UTI 04/23/2009   NEVI, MULTIPLE 02/07/2009   ACUTE PHARYNGITIS 12/27/2008   PREMATURE VENTRICULAR CONTRACTIONS 02/10/2008   GERD 02/10/2008   DIVERTICULOSIS OF COLON 02/10/2008   ENDOMETRIOSIS, SITE UNSPECIFIED 02/10/2008   BACK PAIN, LUMBAR 02/06/2008   MURMUR 07/08/2007   Essential hypertension 05/30/2007   Hypothyroidism 07/29/2006   BIPOLAR I, MIXED, MOST RECENT EPSD NOS  07/29/2006   DEPRESSION 07/29/2006   ROSACEA 07/29/2006   FIBROMYALGIA 07/29/2006   HEADACHE 07/29/2006   Personal history of other diseases of digestive system 07/29/2006   HX, PERSONAL, URINARY CALCULI 07/29/2006    Past Surgical History:  Procedure Laterality Date   ABDOMINAL HYSTERECTOMY     Partial    APPENDECTOMY     BACK SURGERY     Micro diskectomy   BACK SURGERY     L facet rhzotomy   CHOLECYSTECTOMY     EXTRACORPOREAL SHOCK WAVE LITHOTRIPSY Left 03/19/2019   Procedure: EXTRACORPOREAL SHOCK WAVE LITHOTRIPSY (ESWL);  Surgeon: Ceasar Mons, MD;  Location: [facility];  Service: Urology;  Laterality: Left;   TONSILLECTOMY      OB History   No obstetric history on file.      Home Medications    Prior to Admission medications   Medication Sig Start Date End Date Taking? Authorizing Provider  butalbital-acetaminophen-caffeine (FIORICET) 50-325-40 MG tablet 1 po q4h prn 11/01/20  Yes Lowne Lyndal Pulley R, DO  CANNABIDIOL PO Take 0.75 mLs by mouth daily.    Yes [provider]  ciprofloxacin (CIPRO) 500 MG tablet Take 1 tablet (500 mg total) by mouth 2 (two) times daily. 01/29/21  Yes Raylene Everts, MD  clidinium-chlordiazePOXIDE (LIBRAX) 5-2.5 MG  capsule TAKE ONE CAPSULE BY MOUTH 3 TIMES A DAY BEFORE MEALS AS NEEDED 11/01/20  Yes Roma Schanz R, DO  clonazePAM (KLONOPIN) 1 MG tablet Take 1 mg by mouth 3 (three) times daily as needed.   Yes [provider]  fentaNYL (DURAGESIC) 50 MCG/HR Place onto the skin. 12/13/20  Yes [provider]  hydrochlorothiazide (HYDRODIURIL) 25 MG tablet 1 tablet in the morning 07/29/20  Yes [provider]  lamoTRIgine (LAMICTAL) 25 MG tablet Take 25 mg by mouth 2 (two) times daily. 1 in the morning and 1 in the evening 03/07/11  Yes [provider]  levothyroxine (SYNTHROID) 150 MCG tablet Take 1 tablet (150 mcg total) by mouth daily before breakfast. 03/06/19  Yes Carollee Herter, Eiman Maret  R, DO  lisinopril (PRINIVIL,ZESTRIL) 20 MG tablet Take 1 tablet by mouth daily. 09/05/16  Yes [provider]  OVER THE COUNTER MEDICATION Take 3 tablets by mouth daily. cartilast   Yes [provider]  oxyCODONE-acetaminophen (PERCOCET) 10-325 MG tablet Take 1 tablet by mouth daily. Will take up to 6 tabs a day 09/26/17  Yes [provider]  promethazine (PHENERGAN) 25 MG tablet Take 1 tablet (25 mg total) by mouth as needed. 03/28/20  Yes Roma Schanz R, DO  tiZANidine (ZANAFLEX) 4 MG capsule Take 4 mg by mouth 3 (three) times daily as needed for muscle spasms.   Yes [provider]  TRINTELLIX 10 MG TABS tablet Take 10 mg by mouth at bedtime. 01/24/21  Yes [provider]  PRISTIQ 100 MG 24 hr tablet Take 1 tablet by mouth daily. 03/22/11   [provider]    Family History Family History  Problem Relation Age of Onset   Kidney disease Father    Coronary artery disease Father 14   COPD Father 76       copd   Heart disease Father    Colon polyps Mother    Osteoporosis Mother    Colon cancer Neg Hx     Social History Social History   Tobacco Use   Smoking status: Never   Smokeless tobacco: Never  Vaping Use   Vaping Use: Never used  Substance Use Topics   Alcohol use: No   Drug use: No     Allergies   Other, Biaxin [clarithromycin], Clindamycin/lincomycin, Ketorolac tromethamine, Pb-hyoscy-atropine-scopolamine, Penicillins, Phenobarbital, Sulfonamide derivatives, Topiramate, and Zaleplon   Review of Systems Review of Systems See HPI  Physical Exam Triage Vital Signs ED Triage Vitals  Enc Vitals Group     BP 01/29/21 1308 111/75     Pulse Rate 01/29/21 1308 66     Resp 01/29/21 1308 18     Temp 01/29/21 1308 98.6 F (37 C)     Temp Source 01/29/21 1308 Oral     SpO2 01/29/21 1308 95 %     Weight 01/29/21 1309 216 lb (98 kg)     Height 01/29/21 1309 '5\' 5"'$  (1.651 m)     Head Circumference --      Peak  Flow --      Pain Score 01/29/21 1309 2     Pain Loc --      Pain Edu? --      Excl. in [place]? --    No data found.  Updated Vital Signs BP 111/75 (BP Location: Right Arm)   Pulse 66   Temp 98.6 F (37 C) (Oral)   Resp 18   Ht '5\' 5"'$  (1.651 m)  Wt 98 kg   SpO2 95%   BMI 35.94 kg/m   Physical Exam Constitutional:      General: She is not in acute distress.    Appearance: She is well-developed.     Comments: Mildly overweight.  Cautious movements  HENT:     Head: Normocephalic and atraumatic.     Mouth/Throat:     Comments: Mask is in place Eyes:     Conjunctiva/sclera: Conjunctivae normal.     Pupils: Pupils are equal, round, and reactive to light.  Cardiovascular:     Rate and Rhythm: Normal rate and regular rhythm.     Heart sounds: Normal heart sounds.  Pulmonary:     Effort: Pulmonary effort is normal. No respiratory distress.     Breath sounds: Normal breath sounds.  Abdominal:     General: There is no distension.     Palpations: Abdomen is soft.     Tenderness: There is no abdominal tenderness. There is no right CVA tenderness or left CVA tenderness.  Musculoskeletal:        General: Normal range of motion.     Cervical back: Normal range of motion.  Skin:    General: Skin is warm and dry.  Neurological:     Mental Status: She is alert.  Psychiatric:        Mood and Affect: Mood normal.        Behavior: Behavior normal.     UC Treatments / Results  Labs (all labs ordered are listed, but only abnormal results are displayed) Labs Reviewed  POCT URINALYSIS DIP (MANUAL ENTRY) - Abnormal; Notable for the following components:      Result Value   Blood, UA moderate (*)    Leukocytes, UA Small (1+) (*)    All other components within normal limits  URINE CULTURE    EKG   Radiology No results found.  Procedures Procedures (including critical care time)  Medications Ordered in UC Medications - No data to display  Initial Impression / Assessment  and Plan / UC Course  I have reviewed the triage vital signs and the nursing notes.  Pertinent labs & imaging results that were available during my care of the patient were reviewed by me and considered in my medical decision making (see chart for details).     Patient does not have flank pain or symptoms consistent with a kidney stone or kidney infection.  I believe she has hemorrhagic cystitis.  We will culture her urine and place her on antibiotics.  She has taken Cipro successfully several times in the past, and has multiple drug allergies noted in the chart.  Follow-up with primary care and/or urology Final Clinical Impressions(s) / UC Diagnoses   Final diagnoses:  UTI symptoms     Discharge Instructions      Continue to drink lots of water Take Cipro 2 times a day  Check MyChart for your urine culture report.  You will be called if any change in antibiotic is indicated  Follow-up with your urologist and primary care as regularly scheduled   ED Prescriptions     Medication Sig Dispense Auth. Provider   ciprofloxacin (CIPRO) 500 MG tablet Take 1 tablet (500 mg total) by mouth 2 (two) times daily. 14 tablet Raylene Everts, MD      PDMP not reviewed this encounter.   Raylene Everts, MD 01/29/21 1415

## 2021-01-29 NOTE — Discharge Instructions (Addendum)
Continue to drink lots of water Take Cipro 2 times a day  Check MyChart for your urine culture report.  You will be called if any change in antibiotic is indicated  Follow-up with your urologist and primary care as regularly scheduled

## 2021-01-29 NOTE — ED Triage Notes (Signed)
Patient c/o dysuria, off and on urgency/frequency, hematuria x 1 day.  Sx's seem to have resolved but does have a history of kidney stones.  Denies any OTC meds.

## 2021-02-01 ENCOUNTER — Telehealth: Payer: Self-pay

## 2021-02-01 LAB — URINE CULTURE
MICRO NUMBER:: 12272047
SPECIMEN QUALITY:: ADEQUATE

## 2021-02-21 DIAGNOSIS — Z79891 Long term (current) use of opiate analgesic: Secondary | ICD-10-CM | POA: Diagnosis not present

## 2021-02-21 DIAGNOSIS — G894 Chronic pain syndrome: Secondary | ICD-10-CM | POA: Diagnosis not present

## 2021-02-21 DIAGNOSIS — M4726 Other spondylosis with radiculopathy, lumbar region: Secondary | ICD-10-CM | POA: Diagnosis not present

## 2021-02-21 DIAGNOSIS — M62838 Other muscle spasm: Secondary | ICD-10-CM | POA: Diagnosis not present

## 2021-02-21 DIAGNOSIS — E039 Hypothyroidism, unspecified: Secondary | ICD-10-CM | POA: Diagnosis not present

## 2021-03-23 DIAGNOSIS — M4726 Other spondylosis with radiculopathy, lumbar region: Secondary | ICD-10-CM | POA: Diagnosis not present

## 2021-03-23 DIAGNOSIS — G894 Chronic pain syndrome: Secondary | ICD-10-CM | POA: Diagnosis not present

## 2021-03-23 DIAGNOSIS — M62838 Other muscle spasm: Secondary | ICD-10-CM | POA: Diagnosis not present

## 2021-03-23 DIAGNOSIS — Z79891 Long term (current) use of opiate analgesic: Secondary | ICD-10-CM | POA: Diagnosis not present

## 2021-04-20 DIAGNOSIS — M4726 Other spondylosis with radiculopathy, lumbar region: Secondary | ICD-10-CM | POA: Diagnosis not present

## 2021-04-20 DIAGNOSIS — G894 Chronic pain syndrome: Secondary | ICD-10-CM | POA: Diagnosis not present

## 2021-04-20 DIAGNOSIS — Z79891 Long term (current) use of opiate analgesic: Secondary | ICD-10-CM | POA: Diagnosis not present

## 2021-04-20 DIAGNOSIS — M62838 Other muscle spasm: Secondary | ICD-10-CM | POA: Diagnosis not present

## 2021-05-18 DIAGNOSIS — M4726 Other spondylosis with radiculopathy, lumbar region: Secondary | ICD-10-CM | POA: Diagnosis not present

## 2021-05-18 DIAGNOSIS — M62838 Other muscle spasm: Secondary | ICD-10-CM | POA: Diagnosis not present

## 2021-05-18 DIAGNOSIS — Z79891 Long term (current) use of opiate analgesic: Secondary | ICD-10-CM | POA: Diagnosis not present

## 2021-05-18 DIAGNOSIS — G894 Chronic pain syndrome: Secondary | ICD-10-CM | POA: Diagnosis not present

## 2021-05-30 ENCOUNTER — Encounter: Payer: Self-pay | Admitting: Family Medicine

## 2021-05-30 ENCOUNTER — Ambulatory Visit (INDEPENDENT_AMBULATORY_CARE_PROVIDER_SITE_OTHER): Payer: Medicare Other | Admitting: Family Medicine

## 2021-05-30 ENCOUNTER — Other Ambulatory Visit (HOSPITAL_COMMUNITY)
Admission: RE | Admit: 2021-05-30 | Discharge: 2021-05-30 | Disposition: A | Discharge status: Home / Self Care | Payer: Medicare Other | Source: Ambulatory Visit | Attending: Family Medicine | Admitting: Family Medicine

## 2021-05-30 VITALS — BP 142/100 | HR 94 | Temp 99.3°F | Resp 18 | Ht 65.0 in | Wt 219.4 lb

## 2021-05-30 DIAGNOSIS — R350 Frequency of micturition: Secondary | ICD-10-CM | POA: Insufficient documentation

## 2021-05-30 DIAGNOSIS — G43809 Other migraine, not intractable, without status migrainosus: Secondary | ICD-10-CM | POA: Diagnosis not present

## 2021-05-30 LAB — POC URINALSYSI DIPSTICK (AUTOMATED)
Bilirubin, UA: NEGATIVE
Blood, UA: NEGATIVE
Glucose, UA: NEGATIVE
Ketones, UA: NEGATIVE
Nitrite, UA: NEGATIVE
Protein, UA: NEGATIVE
Spec Grav, UA: <=1.005 — AB (ref 1.010–1.025)
Urobilinogen, UA: 0.2 E.U./dL
pH, UA: 6 (ref 5.0–8.0)

## 2021-05-30 MED ORDER — BUTALBITAL-APAP-CAFFEINE 50-325-40 MG PO TABS
ORAL_TABLET | ORAL | 0 refills | Status: DC
Start: 2021-05-30 — End: 2022-01-23

## 2021-05-30 NOTE — Progress Notes (Addendum)
Subjective:   By signing my name below, I, Karen Brown, attest that this documentation has been prepared under the direction and in the presence of Dr. Roma Schanz, DO. 05/30/2021    Patient ID: Karen Brown, female    DOB: [birth year], 60 y.o.   MRN: [id number]  Chief Complaint  Patient presents with   Dysuria    X1 week, Pt states having freq, burning, and cloudy urine.     Dysuria  Associated symptoms include frequency. Pertinent negatives include no hematuria or nausea.  Patient is in today for a office visit.   She complains of frequency, burning, and cloudy urine for the past week. She reports her symptoms have improved in the past week but she continues experiencing frequency. She denies having any odor or discharge. She has seen a urgent care clinic and found no new issues. She reports recently completing a lithotripsy procedure. She denies having any blood in the urine. She does not see a GYN specialist at this time.  She continues having vaginal dryness at this time. She was told by her GYN specialist it was due to menopause. She has tried estrogen patches but has stopped due to wearing them over 5 years. She has tried lubricant and estrogen cream but found no change in her symptoms. She notes having intercourse is painful.    Past Medical History:  Diagnosis Date   Anxiety    Bipolar 1 disorder ([place])    Chronic migraine    Chronically dry eyes    Colitis, ischemic ([place])    Complication of anesthesia    versed does not work as stated per pt    Depression    Diverticulosis    Dry mouth    Family history of adverse reaction to anesthesia    Fibromyalgia    GERD (gastroesophageal reflux disease)    Hemorrhoids    Hyperlipemia    Hypertension    Hypothyroidism    IBS (irritable bowel syndrome)    PVC (premature ventricular contraction)     Past Surgical History:  Procedure Laterality Date   ABDOMINAL HYSTERECTOMY     Partial    APPENDECTOMY     BACK  SURGERY     Micro diskectomy   BACK SURGERY     L facet rhzotomy   CHOLECYSTECTOMY     EXTRACORPOREAL SHOCK WAVE LITHOTRIPSY Left 03/19/2019   Procedure: EXTRACORPOREAL SHOCK WAVE LITHOTRIPSY (ESWL);  Surgeon: Ceasar Mons, MD;  Location: [facility];  Service: Urology;  Laterality: Left;   TONSILLECTOMY      Family History  Problem Relation Age of Onset   Kidney disease Father    Coronary artery disease Father 66   COPD Father 24       copd   Heart disease Father    Colon polyps Mother    Osteoporosis Mother    Colon cancer Neg Hx     Social History   Socioeconomic History   Marital status: Married    Spouse name: Not on file   Number of children: 0   Years of education: Not on file   Highest education level: Not on file  Occupational History   Occupation: Disabled     Employer: [employer]  Tobacco Use   Smoking status: Never   Smokeless tobacco: Never  Vaping Use   Vaping Use: Never used  Substance and Sexual Activity   Alcohol use: No   Drug use: No   Sexual activity: Yes  Other Topics Concern  Not on file  Social History Narrative   Caffeine occ    Social Determinants of Health   Financial Resource Strain: Low Risk    Difficulty of Paying Living Expenses: Not hard at all  Food Insecurity: No Food Insecurity   Worried About Charity fundraiser in the Last Year: Never true   Arboriculturist in the Last Year: Never true  Transportation Needs: No Transportation Needs   Lack of Transportation (Medical): No   Lack of Transportation (Non-Medical): No  Physical Activity: Inactive   Days of Exercise per Week: 0 days   Minutes of Exercise per Session: 0 min  Stress: No Stress Concern Present   Feeling of Stress : Only a little  Social Connections: Moderately Integrated   Frequency of Communication with Friends and Family: More than three times a week   Frequency of Social Gatherings with Friends and Family: Once a week   Attends Religious Services:  More than 4 times per year   Active Member of [employer] or Organizations: No   Attends Archivist Meetings: Never   Marital Status: Married  Human resources officer Violence: Not At Risk   Fear of Current or Ex-Partner: No   Emotionally Abused: No   Physically Abused: No   Sexually Abused: No    Outpatient Medications Prior to Visit  Medication Sig Dispense Refill   CANNABIDIOL PO Take 0.75 mLs by mouth daily.      ciprofloxacin (CIPRO) 500 MG tablet Take 1 tablet (500 mg total) by mouth 2 (two) times daily. 14 tablet 0   clidinium-chlordiazePOXIDE (LIBRAX) 5-2.5 MG capsule TAKE ONE CAPSULE BY MOUTH 3 TIMES A DAY BEFORE MEALS AS NEEDED 90 capsule 2   clonazePAM (KLONOPIN) 1 MG tablet Take 1 mg by mouth 3 (three) times daily as needed.     fentaNYL (DURAGESIC) 50 MCG/HR Place onto the skin.     hydrochlorothiazide (HYDRODIURIL) 25 MG tablet 1 tablet in the morning     lamoTRIgine (LAMICTAL) 25 MG tablet Take 25 mg by mouth 2 (two) times daily. 1 in the morning and 1 in the evening     levothyroxine (SYNTHROID) 150 MCG tablet Take 1 tablet (150 mcg total) by mouth daily before breakfast. 30 tablet 2   lisinopril (PRINIVIL,ZESTRIL) 20 MG tablet Take 1 tablet by mouth daily.     OVER THE COUNTER MEDICATION Take 3 tablets by mouth daily. cartilast     oxyCODONE-acetaminophen (PERCOCET) 10-325 MG tablet Take 1 tablet by mouth daily. Will take up to 6 tabs a day  0   promethazine (PHENERGAN) 25 MG tablet Take 1 tablet (25 mg total) by mouth as needed. 90 tablet 0   tiZANidine (ZANAFLEX) 4 MG capsule Take 4 mg by mouth 3 (three) times daily as needed for muscle spasms.     TRINTELLIX 10 MG TABS tablet Take 10 mg by mouth at bedtime.     butalbital-acetaminophen-caffeine (FIORICET) 50-325-40 MG tablet 1 po q4h prn 20 tablet 0   PRISTIQ 100 MG 24 hr tablet Take 1 tablet by mouth daily.     No facility-administered medications prior to visit.    Allergies  Allergen Reactions   Other Other (See  Comments)    Oral steroids; skin sensitivity   Biaxin [Clarithromycin] Other (See Comments)    Bad taste and ringing in ear with dec hearing   Clindamycin/Lincomycin Dermatitis   Ketorolac Tromethamine    Pb-Hyoscy-Atropine-Scopolamine    Penicillins     REACTION: HIVES  Phenobarbital    Sulfonamide Derivatives    Topiramate    Zaleplon     Review of Systems  Constitutional:  Negative for fever and malaise/fatigue.  HENT:  Negative for congestion.   Eyes:  Negative for blurred vision.  Respiratory:  Negative for shortness of breath.   Cardiovascular:  Negative for chest pain, palpitations and leg swelling.  Gastrointestinal:  Negative for abdominal pain, blood in stool and nausea.  Genitourinary:  Positive for dysuria and frequency. Negative for hematuria.       (+)cloudy urine (-)odor (-)vaginal discharge  Musculoskeletal:  Negative for falls.  Skin:  Negative for rash.  Neurological:  Negative for dizziness, loss of consciousness and headaches.  Endo/Heme/Allergies:  Negative for environmental allergies.  Psychiatric/Behavioral:  Negative for depression. The patient is not nervous/anxious.       Objective:    Physical Exam Vitals and nursing note reviewed.  Constitutional:      General: She is not in acute distress.    Appearance: Normal appearance. She is not ill-appearing.  HENT:     Head: Normocephalic and atraumatic.     Right Ear: External ear normal.     Left Ear: External ear normal.  Eyes:     Extraocular Movements: Extraocular movements intact.     Pupils: Pupils are equal, round, and reactive to light.  Cardiovascular:     Rate and Rhythm: Normal rate and regular rhythm.     Heart sounds: Normal heart sounds. No murmur heard.   No gallop.  Pulmonary:     Effort: Pulmonary effort is normal. No respiratory distress.     Breath sounds: Normal breath sounds. No wheezing or rales.  Abdominal:     Tenderness: There is no abdominal tenderness. There is no  right CVA tenderness, left CVA tenderness, guarding or rebound.  Skin:    General: Skin is warm and dry.  Neurological:     Mental Status: She is alert and oriented to person, place, and time.  Psychiatric:        Behavior: Behavior normal.        Judgment: Judgment normal.    BP (!) 142/100 (BP Location: Left Arm, Patient Position: Sitting, Cuff Size: Large)    Pulse 94    Temp 99.3 F (37.4 C) (Oral)    Resp 18    Ht 5\' 5"  (1.651 m)    Wt 219 lb 6.4 oz (99.5 kg)    SpO2 97%    BMI 36.51 kg/m  Wt Readings from Last 3 Encounters:  05/30/21 219 lb 6.4 oz (99.5 kg)  01/29/21 216 lb (98 kg)  12/13/20 213 lb (96.6 kg)    Diabetic Foot Exam - Simple   No data filed    Lab Results  Component Value Date   WBC 10.0 02/26/2019   HGB 14.7 02/26/2019   HCT 45.0 02/26/2019   PLT 340.0 02/26/2019   GLUCOSE 88 11/01/2020   CHOL 356 (H) 11/01/2020   TRIG (H) 11/01/2020    520.0 Triglyceride is over 400; calculations on Lipids are invalid.   HDL 59.10 11/01/2020   LDLDIRECT 223.0 11/01/2020   LDLCALC 190 (H) 06/27/2015   ALT 17 11/01/2020   AST 17 11/01/2020   NA 137 11/01/2020   K 4.3 11/01/2020   CL 96 11/01/2020   CREATININE 1.18 11/01/2020   BUN 25 (H) 11/01/2020   CO2 32 11/01/2020   TSH 4.67 (H) 02/26/2019   HGBA1C 5.4 08/15/2007   MICROALBUR <0.7 02/26/2019  Lab Results  Component Value Date   TSH 4.67 (H) 02/26/2019   Lab Results  Component Value Date   WBC 10.0 02/26/2019   HGB 14.7 02/26/2019   HCT 45.0 02/26/2019   MCV 87.5 02/26/2019   PLT 340.0 02/26/2019   Lab Results  Component Value Date   NA 137 11/01/2020   K 4.3 11/01/2020   CO2 32 11/01/2020   GLUCOSE 88 11/01/2020   BUN 25 (H) 11/01/2020   CREATININE 1.18 11/01/2020   BILITOT 0.3 11/01/2020   ALKPHOS 95 11/01/2020   AST 17 11/01/2020   ALT 17 11/01/2020   PROT 8.0 11/01/2020   ALBUMIN 4.8 11/01/2020   CALCIUM 10.3 11/01/2020   GFR 49.80 (L) 11/01/2020   Lab Results  Component Value  Date   CHOL 356 (H) 11/01/2020   Lab Results  Component Value Date   HDL 59.10 11/01/2020   Lab Results  Component Value Date   LDLCALC 190 (H) 06/27/2015   Lab Results  Component Value Date   TRIG (H) 11/01/2020    520.0 Triglyceride is over 400; calculations on Lipids are invalid.   Lab Results  Component Value Date   CHOLHDL 6 11/01/2020   Lab Results  Component Value Date   HGBA1C 5.4 08/15/2007       Assessment & Plan:   Problem List Items Addressed This Visit       Unprioritized   Urinary frequency - Primary    Urine culture pending and cervicovaginal ancillary   May be from vaginal atrophy-- she will f/u gyn as well        Relevant Orders   POCT Urinalysis Dipstick (Automated) (Completed)   Urine Culture   Cervicovaginal ancillary only( [place])   Migraine   Relevant Medications   butalbital-acetaminophen-caffeine (FIORICET) 50-325-40 MG tablet     Meds ordered this encounter  Medications   butalbital-acetaminophen-caffeine (FIORICET) 50-325-40 MG tablet    Sig: 1 po q4h prn    Dispense:  20 tablet    Refill:  0    I, Dr. Roma Schanz, DO, personally preformed the services described in this documentation.  All medical record entries made by the scribe were at my direction and in my presence.  I have reviewed the chart and discharge instructions (if applicable) and agree that the record reflects my personal performance and is accurate and complete. 05/30/2021   I,Karen Brown,acting as a scribe for Ann Held, DO.,have documented all relevant documentation on the behalf of Ann Held, DO,as directed by  Ann Held, DO while in the presence of Ann Held, DO.   Ann Held, DO

## 2021-05-30 NOTE — Patient Instructions (Signed)
Urinary Frequency, Adult Urinary frequency means urinating more often than usual. You may urinate every 1-2 hours even though you drink a normal amount of fluid and do not have a bladder infection or condition. Although you urinate more often than normal, the total amount of urine produced in a day is normal. With urinary frequency, you may have an urgent need to urinate often. The stress and anxiety of needing to find a bathroom quickly can make this urge worse. This condition may go away on its own, or you may need treatment at home. Home treatment may include bladder training, exercises, taking medicines, or making changes to your diet. Follow these instructions at home: Bladder health Your health care provider will tell you what to do to improve bladder health. You may be told to: Keep a bladder diary. Keep track of: What you eat and drink. How often you urinate. How much you urinate. Follow a bladder training program. This may include: Learning to delay going to the bathroom. Double urinating, also called voiding. This helps if you are not completely emptying your bladder. Scheduled voiding. Do Kegel exercises. Kegel exercises strengthen the muscles that help control urination, which may help the condition.  Eating and drinking Follow instructions from your health care provider about eating or drinking restrictions. You may be told to: Avoid caffeine. Drink fewer fluids, especially alcohol. Avoid drinking in the evening. Avoid foods or drinks that may irritate the bladder. These include coffee, tea, soda, artificial sweeteners, citrus, tomato-based foods, and chocolate. Eat foods that help prevent or treat constipation. Constipation can make urinary frequency worse. You may need to take these actions to prevent or treat constipation: Drink enough fluid to keep your urine pale yellow. Take over-the-counter or prescription medicines. Eat foods that are high in fiber, such as beans, whole  grains, and fresh fruits and vegetables. Limit foods that are high in fat and processed sugars, such as fried or sweet foods. General instructions Take over-the-counter and prescription medicines only as told by your health care provider. Keep all follow-up visits. This is important. Contact a health care provider if: You start urinating more often. You feel pain or irritation when you urinate. You notice blood in your urine. Your urine looks cloudy. You develop a fever. You begin vomiting. Get help right away if: You are unable to urinate. Summary Urinary frequency means urinating more often than usual. With urinary frequency, you may urinate every 1-2 hours even though you drink a normal amount of fluid and do not have a bladder infection or other bladder condition. Your health care provider may recommend that you keep a bladder diary, follow a bladder training program, or make dietary changes. If told by your health care provider, do Kegel exercises to strengthen the muscles that help control urination. Take over-the-counter and prescription medicines only as told by your health care provider. Contact a health care provider if your symptoms do not improve or get worse. This information is not intended to replace advice given to you by your health care provider. Make sure you discuss any questions you have with your health care provider. Document Revised: 01/01/2020 Document Reviewed: 01/01/2020 Elsevier Patient Education  [place].

## 2021-05-30 NOTE — Assessment & Plan Note (Signed)
Urine culture pending and cervicovaginal ancillary   May be from vaginal atrophy-- she will f/u gyn as well

## 2021-06-01 LAB — URINE CULTURE
MICRO NUMBER:: 12780173
SPECIMEN QUALITY:: ADEQUATE

## 2021-06-01 LAB — CERVICOVAGINAL ANCILLARY ONLY
Bacterial Vaginitis (gardnerella): NEGATIVE
Candida Glabrata: NEGATIVE
Candida Vaginitis: NEGATIVE
Chlamydia: NEGATIVE
Comment: NEGATIVE
Comment: NEGATIVE
Comment: NEGATIVE
Comment: NEGATIVE
Comment: NEGATIVE
Comment: NORMAL
Neisseria Gonorrhea: NEGATIVE
Trichomonas: NEGATIVE

## 2021-06-02 ENCOUNTER — Other Ambulatory Visit: Payer: Self-pay | Admitting: Family Medicine

## 2021-06-02 ENCOUNTER — Other Ambulatory Visit: Payer: Self-pay

## 2021-06-02 ENCOUNTER — Encounter: Payer: Self-pay | Admitting: Family Medicine

## 2021-06-02 MED ORDER — PHENAZOPYRIDINE HCL 200 MG PO TABS: 200.0000 mg | ORAL_TABLET | Freq: Three times a day (TID) | ORAL | 0 refills | Status: DC | PRN

## 2021-06-02 MED ORDER — CIPROFLOXACIN HCL 250 MG PO TABS: 250.0000 mg | ORAL_TABLET | Freq: Two times a day (BID) | ORAL | 0 refills | Status: AC

## 2021-06-02 NOTE — Telephone Encounter (Signed)
pt reviewed lab results for urine culture and would like to know what lowne suggest moving forward. please advise.

## 2021-06-15 DIAGNOSIS — G894 Chronic pain syndrome: Secondary | ICD-10-CM | POA: Diagnosis not present

## 2021-06-15 DIAGNOSIS — M62838 Other muscle spasm: Secondary | ICD-10-CM | POA: Diagnosis not present

## 2021-06-15 DIAGNOSIS — Z79891 Long term (current) use of opiate analgesic: Secondary | ICD-10-CM | POA: Diagnosis not present

## 2021-06-15 DIAGNOSIS — M4726 Other spondylosis with radiculopathy, lumbar region: Secondary | ICD-10-CM | POA: Diagnosis not present

## 2021-06-20 DIAGNOSIS — N200 Calculus of kidney: Secondary | ICD-10-CM | POA: Diagnosis not present

## 2021-06-20 DIAGNOSIS — E039 Hypothyroidism, unspecified: Secondary | ICD-10-CM | POA: Diagnosis not present

## 2021-06-20 DIAGNOSIS — E559 Vitamin D deficiency, unspecified: Secondary | ICD-10-CM | POA: Diagnosis not present

## 2021-06-20 DIAGNOSIS — I10 Essential (primary) hypertension: Secondary | ICD-10-CM | POA: Diagnosis not present

## 2021-06-20 DIAGNOSIS — R7301 Impaired fasting glucose: Secondary | ICD-10-CM | POA: Diagnosis not present

## 2021-06-20 DIAGNOSIS — E7800 Pure hypercholesterolemia, unspecified: Secondary | ICD-10-CM | POA: Diagnosis not present

## 2021-06-27 ENCOUNTER — Other Ambulatory Visit: Payer: Self-pay | Admitting: Family Medicine

## 2021-06-27 DIAGNOSIS — Z1231 Encounter for screening mammogram for malignant neoplasm of breast: Secondary | ICD-10-CM

## 2021-06-30 DIAGNOSIS — K589 Irritable bowel syndrome without diarrhea: Secondary | ICD-10-CM | POA: Insufficient documentation

## 2021-07-13 DIAGNOSIS — M4726 Other spondylosis with radiculopathy, lumbar region: Secondary | ICD-10-CM | POA: Diagnosis not present

## 2021-07-13 DIAGNOSIS — Z79891 Long term (current) use of opiate analgesic: Secondary | ICD-10-CM | POA: Diagnosis not present

## 2021-07-13 DIAGNOSIS — G894 Chronic pain syndrome: Secondary | ICD-10-CM | POA: Diagnosis not present

## 2021-07-13 DIAGNOSIS — M62838 Other muscle spasm: Secondary | ICD-10-CM | POA: Diagnosis not present

## 2021-07-18 ENCOUNTER — Ambulatory Visit: Payer: Medicare Other

## 2021-07-21 ENCOUNTER — Ambulatory Visit: Payer: Medicare Other

## 2021-08-02 ENCOUNTER — Ambulatory Visit
Admission: RE | Admit: 2021-08-02 | Discharge: 2021-08-02 | Disposition: A | Discharge status: Home / Self Care | Payer: Medicare Other | Source: Ambulatory Visit | Attending: Family Medicine | Admitting: Family Medicine

## 2021-08-02 DIAGNOSIS — Z1231 Encounter for screening mammogram for malignant neoplasm of breast: Secondary | ICD-10-CM | POA: Diagnosis not present

## 2021-08-10 DIAGNOSIS — M4726 Other spondylosis with radiculopathy, lumbar region: Secondary | ICD-10-CM | POA: Diagnosis not present

## 2021-08-10 DIAGNOSIS — Z79891 Long term (current) use of opiate analgesic: Secondary | ICD-10-CM | POA: Diagnosis not present

## 2021-08-10 DIAGNOSIS — M62838 Other muscle spasm: Secondary | ICD-10-CM | POA: Diagnosis not present

## 2021-08-10 DIAGNOSIS — G894 Chronic pain syndrome: Secondary | ICD-10-CM | POA: Diagnosis not present

## 2021-09-07 DIAGNOSIS — G894 Chronic pain syndrome: Secondary | ICD-10-CM | POA: Diagnosis not present

## 2021-09-07 DIAGNOSIS — M62838 Other muscle spasm: Secondary | ICD-10-CM | POA: Diagnosis not present

## 2021-09-07 DIAGNOSIS — Z79891 Long term (current) use of opiate analgesic: Secondary | ICD-10-CM | POA: Diagnosis not present

## 2021-09-07 DIAGNOSIS — M4726 Other spondylosis with radiculopathy, lumbar region: Secondary | ICD-10-CM | POA: Diagnosis not present

## 2021-09-07 DIAGNOSIS — N200 Calculus of kidney: Secondary | ICD-10-CM | POA: Diagnosis not present

## 2021-10-05 DIAGNOSIS — M62838 Other muscle spasm: Secondary | ICD-10-CM | POA: Diagnosis not present

## 2021-10-05 DIAGNOSIS — G894 Chronic pain syndrome: Secondary | ICD-10-CM | POA: Diagnosis not present

## 2021-10-05 DIAGNOSIS — Z79891 Long term (current) use of opiate analgesic: Secondary | ICD-10-CM | POA: Diagnosis not present

## 2021-10-05 DIAGNOSIS — M4726 Other spondylosis with radiculopathy, lumbar region: Secondary | ICD-10-CM | POA: Diagnosis not present

## 2021-11-01 DIAGNOSIS — G894 Chronic pain syndrome: Secondary | ICD-10-CM | POA: Diagnosis not present

## 2021-11-01 DIAGNOSIS — Z79891 Long term (current) use of opiate analgesic: Secondary | ICD-10-CM | POA: Diagnosis not present

## 2021-11-01 DIAGNOSIS — M62838 Other muscle spasm: Secondary | ICD-10-CM | POA: Diagnosis not present

## 2021-11-01 DIAGNOSIS — M4726 Other spondylosis with radiculopathy, lumbar region: Secondary | ICD-10-CM | POA: Diagnosis not present

## 2021-11-02 ENCOUNTER — Encounter: Payer: Self-pay | Admitting: Family Medicine

## 2021-11-02 ENCOUNTER — Ambulatory Visit (INDEPENDENT_AMBULATORY_CARE_PROVIDER_SITE_OTHER): Payer: Medicare Other | Admitting: Family Medicine

## 2021-11-02 VITALS — BP 134/88 | HR 59 | Temp 98.5°F | Resp 18 | Ht 65.0 in | Wt 208.4 lb

## 2021-11-02 DIAGNOSIS — I10 Essential (primary) hypertension: Secondary | ICD-10-CM

## 2021-11-02 DIAGNOSIS — E039 Hypothyroidism, unspecified: Secondary | ICD-10-CM

## 2021-11-02 DIAGNOSIS — R110 Nausea: Secondary | ICD-10-CM | POA: Insufficient documentation

## 2021-11-02 DIAGNOSIS — Z0000 Encounter for general adult medical examination without abnormal findings: Secondary | ICD-10-CM

## 2021-11-02 DIAGNOSIS — E2839 Other primary ovarian failure: Secondary | ICD-10-CM | POA: Insufficient documentation

## 2021-11-02 MED ORDER — PROMETHAZINE HCL 25 MG PO TABS: 25.0000 mg | ORAL_TABLET | ORAL | 0 refills | Status: DC | PRN

## 2021-11-02 NOTE — Assessment & Plan Note (Signed)
Per gyn 

## 2021-11-02 NOTE — Assessment & Plan Note (Signed)
ghm utd Check labs  See avs  

## 2021-11-02 NOTE — Patient Instructions (Signed)
Preventive Care 63-64 Years Old, Female Preventive care refers to lifestyle choices and visits with your health care provider that can promote health and wellness. Preventive care visits are also called wellness exams. What can I expect for my preventive care visit? Counseling Your health care provider may ask you questions about your: Medical history, including: Past medical problems. Family medical history. Pregnancy history. Current health, including: Menstrual cycle. Method of birth control. Emotional well-being. Home life and relationship well-being. Sexual activity and sexual health. Lifestyle, including: Alcohol, nicotine or tobacco, and drug use. Access to firearms. Diet, exercise, and sleep habits. Work and work environment. Sunscreen use. Safety issues such as seatbelt and bike helmet use. Physical exam Your health care provider will check your: Height and weight. These may be used to calculate your BMI (body mass index). BMI is a measurement that tells if you are at a healthy weight. Waist circumference. This measures the distance around your waistline. This measurement also tells if you are at a healthy weight and may help predict your risk of certain diseases, such as type 2 diabetes and high blood pressure. Heart rate and blood pressure. Body temperature. Skin for abnormal spots. What immunizations do I need?  Vaccines are usually given at various ages, according to a schedule. Your health care provider will recommend vaccines for you based on your age, medical history, and lifestyle or other factors, such as travel or where you work. What tests do I need? Screening Your health care provider may recommend screening tests for certain conditions. This may include: Lipid and cholesterol levels. Diabetes screening. This is done by checking your blood sugar (glucose) after you have not eaten for a while (fasting). Pelvic exam and Pap test. Hepatitis B test. Hepatitis C  test. HIV (human immunodeficiency virus) test. STI (sexually transmitted infection) testing, if you are at risk. Lung cancer screening. Colorectal cancer screening. Mammogram. Talk with your health care provider about when you should start having regular mammograms. This may depend on whether you have a family history of breast cancer. BRCA-related cancer screening. This may be done if you have a family history of breast, ovarian, tubal, or peritoneal cancers. Bone density scan. This is done to screen for osteoporosis. Talk with your health care provider about your test results, treatment options, and if necessary, the need for more tests. Follow these instructions at home: Eating and drinking  Eat a diet that includes fresh fruits and vegetables, whole grains, lean protein, and low-fat dairy products. Take vitamin and mineral supplements as recommended by your health care provider. Do not drink alcohol if: Your health care provider tells you not to drink. You are pregnant, may be pregnant, or are planning to become pregnant. If you drink alcohol: Limit how much you have to 0-1 drink a day. Know how much alcohol is in your drink. In the [place] one drink equals one 12 oz bottle of beer (355 mL), one 5 oz glass of wine (148 mL), or one 1 oz glass of hard liquor (44 mL). Lifestyle Brush your teeth every morning and night with fluoride toothpaste. Floss one time each day. Exercise for at least 30 minutes 5 or more days each week. Do not use any products that contain nicotine or tobacco. These products include cigarettes, chewing tobacco, and vaping devices, such as e-cigarettes. If you need help quitting, ask your health care provider. Do not use drugs. If you are sexually active, practice safe sex. Use a condom or other form of protection to   prevent STIs. If you do not wish to become pregnant, use a form of birth control. If you plan to become pregnant, see your health care provider for a  prepregnancy visit. Take aspirin only as told by your health care provider. Make sure that you understand how much to take and what form to take. Work with your health care provider to find out whether it is safe and beneficial for you to take aspirin daily. Find healthy ways to manage stress, such as: Meditation, yoga, or listening to music. Journaling. Talking to a trusted person. Spending time with friends and family. Minimize exposure to UV radiation to reduce your risk of skin cancer. Safety Always wear your seat belt while driving or riding in a vehicle. Do not drive: If you have been drinking alcohol. Do not ride with someone who has been drinking. When you are tired or distracted. While texting. If you have been using any mind-altering substances or drugs. Wear a helmet and other protective equipment during sports activities. If you have firearms in your house, make sure you follow all gun safety procedures. Seek help if you have been physically or sexually abused. What's next? Visit your health care provider once a year for an annual wellness visit. Ask your health care provider how often you should have your eyes and teeth checked. Stay up to date on all vaccines. This information is not intended to replace advice given to you by your health care provider. Make sure you discuss any questions you have with your health care provider. Document Revised: 11/23/2020 Document Reviewed: 11/23/2020 Elsevier Patient Education  [place].

## 2021-11-02 NOTE — Progress Notes (Signed)
Subjective:   By signing my name below, I, [name], attest that this documentation has been prepared under the direction and in the presence of Dr. Roma Schanz, DO. 11/02/2021    Patient ID: Karen Brown, female    DOB: 1960-07-18, 61 y.o.   MRN: [id number]  Chief Complaint  Patient presents with   Annual Exam    Pt states not fasting     HPI Patient is in today for a comprehensive physical exam.   She is requesting a refill for 25 mg phenergan.  She started taking estradiol cream and reports no new issues while taking it.  Her blood pressure is elevated during this visit. She switched her depression medication and reports a side effect is increased blood pressure. She prefers this medication due to not causing her stomach issues.  BP Readings from Last 3 Encounters:  11/02/21 134/88  05/30/21 (!) 142/100  01/29/21 111/75   Pulse Readings from Last 3 Encounters:  11/02/21 (!) 59  05/30/21 94  01/29/21 66   She denies having any fever, new moles, congestion, sore throat, new muscle pain, new joint pain, chest pain, cough, SOB, wheezing, n/v/d, constipation, blood in stool, dysuria, frequency, hematuria, or headaches at this time. She has taken the first shingrix vaccine and reports having a bad reaction. She is willing to take the second dose after a couple months pass after her first.  She has lost weight since her last visit.  Wt Readings from Last 3 Encounters:  11/02/21 208 lb 6.4 oz (94.5 kg)  05/30/21 219 lb 6.4 oz (99.5 kg)  01/29/21 216 lb (98 kg)    Past Medical History:  Diagnosis Date   Anxiety    Bipolar 1 disorder (HCC)    Chronic migraine    Chronically dry eyes    Colitis, ischemic ([place])    Complication of anesthesia    versed does not work as stated per pt    Depression    Diverticulosis    Dry mouth    Family history of adverse reaction to anesthesia    Fibromyalgia    GERD (gastroesophageal reflux disease)    Hemorrhoids     Hyperlipemia    Hypertension    Hypothyroidism    IBS (irritable bowel syndrome)    PVC (premature ventricular contraction)     Past Surgical History:  Procedure Laterality Date   ABDOMINAL HYSTERECTOMY     Partial    APPENDECTOMY     BACK SURGERY     Micro diskectomy   BACK SURGERY     L facet rhzotomy   CHOLECYSTECTOMY     EXTRACORPOREAL SHOCK WAVE LITHOTRIPSY Left 03/19/2019   Procedure: EXTRACORPOREAL SHOCK WAVE LITHOTRIPSY (ESWL);  Surgeon: Ceasar Mons, MD;  Location: [facility];  Service: Urology;  Laterality: Left;   TONSILLECTOMY      Family History  Problem Relation Age of Onset   Kidney disease Father    Coronary artery disease Father 59   COPD Father 69       copd   Heart disease Father    Colon polyps Mother    Osteoporosis Mother    Colon cancer Neg Hx     Social History   Socioeconomic History   Marital status: Married    Spouse name: Not on file   Number of children: 0   Years of education: Not on file   Highest education level: Not on file  Occupational History   Occupation: Disabled  Employer: [employer]  Tobacco Use   Smoking status: Never   Smokeless tobacco: Never  Vaping Use   Vaping Use: Never used  Substance and Sexual Activity   Alcohol use: No   Drug use: No   Sexual activity: Yes  Other Topics Concern   Not on file  Social History Narrative   Caffeine occ    Social Determinants of Health   Financial Resource Strain: Low Risk    Difficulty of Paying Living Expenses: Not hard at all  Food Insecurity: No Food Insecurity   Worried About Charity fundraiser in the Last Year: Never true   Ran Out of Food in the Last Year: Never true  Transportation Needs: No Transportation Needs   Lack of Transportation (Medical): No   Lack of Transportation (Non-Medical): No  Physical Activity: Inactive   Days of Exercise per Week: 0 days   Minutes of Exercise per Session: 0 min  Stress: No Stress Concern Present   Feeling of  Stress : Only a little  Social Connections: Moderately Integrated   Frequency of Communication with Friends and Family: More than three times a week   Frequency of Social Gatherings with Friends and Family: Once a week   Attends Religious Services: More than 4 times per year   Active Member of [employer] or Organizations: No   Attends Archivist Meetings: Never   Marital Status: Married  Human resources officer Violence: Not At Risk   Fear of Current or Ex-Partner: No   Emotionally Abused: No   Physically Abused: No   Sexually Abused: No    Outpatient Medications Prior to Visit  Medication Sig Dispense Refill   atenolol (TENORMIN) 25 MG tablet 1 tablet     AUVELITY 45-105 MG TBCR Take by mouth.     butalbital-acetaminophen-caffeine (FIORICET) 50-325-40 MG tablet 1 po q4h prn 20 tablet 0   Cholecalciferol 50 MCG (2000 UT) CAPS Vitamin D3 50 mcg (2,000 unit) capsule   1 capsule every day by oral route.     clonazePAM (KLONOPIN) 1 MG tablet Take 1 mg by mouth 3 (three) times daily as needed.     fentaNYL (DURAGESIC) 50 MCG/HR Place onto the skin.     lamoTRIgine (LAMICTAL) 25 MG tablet Take 25 mg by mouth 2 (two) times daily. 1 in the morning and 1 in the evening     levothyroxine (SYNTHROID) 150 MCG tablet Take 1 tablet (150 mcg total) by mouth daily before breakfast. 30 tablet 2   lisinopril (PRINIVIL,ZESTRIL) 20 MG tablet Take 1 tablet by mouth daily.     OVER THE COUNTER MEDICATION Take 3 tablets by mouth daily. cartilast     oxyCODONE-acetaminophen (PERCOCET) 10-325 MG tablet Take 1 tablet by mouth daily. Will take up to 6 tabs a day  0   tiZANidine (ZANAFLEX) 4 MG capsule Take 4 mg by mouth 3 (three) times daily as needed for muscle spasms.     promethazine (PHENERGAN) 25 MG tablet Take 1 tablet (25 mg total) by mouth as needed. 90 tablet 0   estradiol (ESTRACE) 0.1 MG/GM vaginal cream Place vaginally.     CANNABIDIOL PO Take 0.75 mLs by mouth daily.      clidinium-chlordiazePOXIDE  (LIBRAX) 5-2.5 MG capsule TAKE ONE CAPSULE BY MOUTH 3 TIMES A DAY BEFORE MEALS AS NEEDED 90 capsule 2   hydrochlorothiazide (HYDRODIURIL) 25 MG tablet 1 tablet in the morning     phenazopyridine (PYRIDIUM) 200 MG tablet Take 1 tablet (200 mg total)  by mouth 3 (three) times daily as needed for pain. 10 tablet 0   TRINTELLIX 10 MG TABS tablet Take 10 mg by mouth at bedtime.     No facility-administered medications prior to visit.    Allergies  Allergen Reactions   Other Other (See Comments)    Oral steroids; skin sensitivity   Biaxin [Clarithromycin] Other (See Comments)    Bad taste and ringing in ear with dec hearing   Clindamycin/Lincomycin Dermatitis   Ketorolac Tromethamine    Pb-Hyoscy-Atropine-Scopolamine    Penicillins     REACTION: HIVES   Phenobarbital    Sulfonamide Derivatives    Topiramate    Zaleplon     Review of Systems  Constitutional:  Negative for fever.  HENT:  Negative for congestion, sinus pain and sore throat.   Respiratory:  Negative for cough, shortness of breath and wheezing.   Cardiovascular:  Negative for chest pain and palpitations.  Gastrointestinal:  Negative for blood in stool, constipation, diarrhea, nausea and vomiting.  Genitourinary:  Negative for dysuria, frequency and hematuria.  Musculoskeletal:  Negative for joint pain and myalgias.  Skin:        (-)New moles  Neurological:  Negative for headaches.      Objective:    Physical Exam Constitutional:      General: She is not in acute distress.    Appearance: Normal appearance. She is not ill-appearing.  HENT:     Head: Normocephalic and atraumatic.     Right Ear: Tympanic membrane, ear canal and external ear normal.     Left Ear: Tympanic membrane, ear canal and external ear normal.  Eyes:     Extraocular Movements: Extraocular movements intact.     Pupils: Pupils are equal, round, and reactive to light.  Cardiovascular:     Rate and Rhythm: Normal rate and regular rhythm.      Heart sounds: Normal heart sounds. No murmur heard.   No gallop.  Pulmonary:     Effort: Pulmonary effort is normal. No respiratory distress.     Breath sounds: Normal breath sounds. No wheezing or rales.  Abdominal:     General: Bowel sounds are normal. There is no distension.     Palpations: Abdomen is soft.     Tenderness: There is no abdominal tenderness. There is no guarding.  Skin:    General: Skin is warm and dry.  Neurological:     Mental Status: She is alert and oriented to person, place, and time.  Psychiatric:        Judgment: Judgment normal.    BP 134/88 (BP Location: Left Arm, Patient Position: Sitting, Cuff Size: Large)   Pulse (!) 59   Temp 98.5 F (36.9 C) (Oral)   Resp 18   Ht '5\' 5"'$  (1.651 m)   Wt 208 lb 6.4 oz (94.5 kg)   SpO2 98%   BMI 34.68 kg/m  Wt Readings from Last 3 Encounters:  11/02/21 208 lb 6.4 oz (94.5 kg)  05/30/21 219 lb 6.4 oz (99.5 kg)  01/29/21 216 lb (98 kg)    Diabetic Foot Exam - Simple   No data filed    Lab Results  Component Value Date   WBC 10.0 02/26/2019   HGB 14.7 02/26/2019   HCT 45.0 02/26/2019   PLT 340.0 02/26/2019   GLUCOSE 88 11/01/2020   CHOL 356 (H) 11/01/2020   TRIG (H) 11/01/2020    520.0 Triglyceride is over 400; calculations on Lipids are invalid.   HDL  59.10 11/01/2020   LDLDIRECT 223.0 11/01/2020   LDLCALC 190 (H) 06/27/2015   ALT 17 11/01/2020   AST 17 11/01/2020   NA 137 11/01/2020   K 4.3 11/01/2020   CL 96 11/01/2020   CREATININE 1.18 11/01/2020   BUN 25 (H) 11/01/2020   CO2 32 11/01/2020   TSH 4.67 (H) 02/26/2019   HGBA1C 5.4 08/15/2007   MICROALBUR <0.7 02/26/2019    Lab Results  Component Value Date   TSH 4.67 (H) 02/26/2019   Lab Results  Component Value Date   WBC 10.0 02/26/2019   HGB 14.7 02/26/2019   HCT 45.0 02/26/2019   MCV 87.5 02/26/2019   PLT 340.0 02/26/2019   Lab Results  Component Value Date   NA 137 11/01/2020   K 4.3 11/01/2020   CO2 32 11/01/2020   GLUCOSE  88 11/01/2020   BUN 25 (H) 11/01/2020   CREATININE 1.18 11/01/2020   BILITOT 0.3 11/01/2020   ALKPHOS 95 11/01/2020   AST 17 11/01/2020   ALT 17 11/01/2020   PROT 8.0 11/01/2020   ALBUMIN 4.8 11/01/2020   CALCIUM 10.3 11/01/2020   GFR 49.80 (L) 11/01/2020   Lab Results  Component Value Date   CHOL 356 (H) 11/01/2020   Lab Results  Component Value Date   HDL 59.10 11/01/2020   Lab Results  Component Value Date   LDLCALC 190 (H) 06/27/2015   Lab Results  Component Value Date   TRIG (H) 11/01/2020    520.0 Triglyceride is over 400; calculations on Lipids are invalid.   Lab Results  Component Value Date   CHOLHDL 6 11/01/2020   Lab Results  Component Value Date   HGBA1C 5.4 08/15/2007   Mammogram- Last completed 08/02/2021. Results are normal. Repeat in 1 year.  Dexa- Last completed 07/12/2016. Results showed she is osteopenic. Repeat in 2 years.  Colonoscopy- Last completed 12/09/2009.      Assessment & Plan:   Problem List Items Addressed This Visit       Unprioritized   Primary hypertension    Well controlled, no changes to meds. Encouraged heart healthy diet such as the DASH diet and exercise as tolerated.        Relevant Medications   atenolol (TENORMIN) 25 MG tablet   Other Relevant Orders   CBC with Differential/Platelet   Comprehensive metabolic panel   Lipid panel   Preventative health care - Primary    ghm utd Check labs  See av s       Relevant Orders   CBC with Differential/Platelet   Comprehensive metabolic panel   Lipid panel   TSH   Nausea without vomiting   Relevant Medications   promethazine (PHENERGAN) 25 MG tablet   Hypothyroidism    Check labs  con't synthroid        Relevant Medications   atenolol (TENORMIN) 25 MG tablet   Other Relevant Orders   TSH   Estrogen deficiency    Per gyn       Relevant Orders   DG Bone Density     Meds ordered this encounter  Medications   promethazine (PHENERGAN) 25 MG tablet     Sig: Take 1 tablet (25 mg total) by mouth as needed.    Dispense:  90 tablet    Refill:  0    I, Ann Held, DO, personally preformed the services described in this documentation.  All medical record entries made by the scribe were at my direction and  in my presence.  I have reviewed the chart and discharge instructions (if applicable) and agree that the record reflects my personal performance and is accurate and complete. 11/02/2021   I,[name],acting as a scribe for Ann Held, DO.,have documented all relevant documentation on the behalf of Ann Held, DO,as directed by  Ann Held, DO while in the presence of Ann Held, DO.   Ann Held, DO

## 2021-11-02 NOTE — Assessment & Plan Note (Signed)
Well controlled, no changes to meds. Encouraged heart healthy diet such as the DASH diet and exercise as tolerated.  °

## 2021-11-02 NOTE — Assessment & Plan Note (Signed)
Check labs con't synthroid 

## 2021-11-09 ENCOUNTER — Ambulatory Visit (HOSPITAL_BASED_OUTPATIENT_CLINIC_OR_DEPARTMENT_OTHER)
Admission: RE | Admit: 2021-11-09 | Discharge: 2021-11-09 | Disposition: A | Discharge status: Home / Self Care | Payer: Medicare Other | Source: Ambulatory Visit | Attending: Family Medicine | Admitting: Family Medicine

## 2021-11-09 DIAGNOSIS — M85852 Other specified disorders of bone density and structure, left thigh: Secondary | ICD-10-CM | POA: Diagnosis not present

## 2021-11-09 DIAGNOSIS — Z780 Asymptomatic menopausal state: Secondary | ICD-10-CM | POA: Diagnosis not present

## 2021-11-09 DIAGNOSIS — E2839 Other primary ovarian failure: Secondary | ICD-10-CM | POA: Diagnosis not present

## 2021-11-10 ENCOUNTER — Other Ambulatory Visit (HOSPITAL_BASED_OUTPATIENT_CLINIC_OR_DEPARTMENT_OTHER): Payer: Self-pay

## 2021-11-10 MED ORDER — OXYCODONE-ACETAMINOPHEN 10-325 MG PO TABS
ORAL_TABLET | ORAL | 0 refills | Status: DC
  Filled 2021-11-10: qty 180, 30d supply, fill #0

## 2021-11-29 ENCOUNTER — Other Ambulatory Visit (HOSPITAL_BASED_OUTPATIENT_CLINIC_OR_DEPARTMENT_OTHER): Payer: Self-pay

## 2021-11-29 DIAGNOSIS — Z79891 Long term (current) use of opiate analgesic: Secondary | ICD-10-CM | POA: Diagnosis not present

## 2021-11-29 DIAGNOSIS — G894 Chronic pain syndrome: Secondary | ICD-10-CM | POA: Diagnosis not present

## 2021-11-29 DIAGNOSIS — M4726 Other spondylosis with radiculopathy, lumbar region: Secondary | ICD-10-CM | POA: Diagnosis not present

## 2021-11-29 DIAGNOSIS — M62838 Other muscle spasm: Secondary | ICD-10-CM | POA: Diagnosis not present

## 2021-11-29 MED ORDER — FENTANYL 50 MCG/HR TD PT72
MEDICATED_PATCH | TRANSDERMAL | 0 refills | Status: DC
  Filled 2021-11-29 – 2021-12-08 (×2): qty 15, 30d supply, fill #0

## 2021-11-29 MED ORDER — OXYCODONE-ACETAMINOPHEN 10-325 MG PO TABS
ORAL_TABLET | ORAL | 0 refills | Status: DC
  Filled 2021-12-08: qty 180, 30d supply, fill #0

## 2021-12-05 ENCOUNTER — Other Ambulatory Visit (HOSPITAL_BASED_OUTPATIENT_CLINIC_OR_DEPARTMENT_OTHER): Payer: Self-pay

## 2021-12-08 ENCOUNTER — Other Ambulatory Visit (HOSPITAL_BASED_OUTPATIENT_CLINIC_OR_DEPARTMENT_OTHER): Payer: Self-pay

## 2021-12-11 ENCOUNTER — Other Ambulatory Visit (HOSPITAL_BASED_OUTPATIENT_CLINIC_OR_DEPARTMENT_OTHER): Payer: Self-pay

## 2021-12-13 ENCOUNTER — Other Ambulatory Visit (HOSPITAL_BASED_OUTPATIENT_CLINIC_OR_DEPARTMENT_OTHER): Payer: Self-pay

## 2021-12-26 ENCOUNTER — Other Ambulatory Visit (HOSPITAL_BASED_OUTPATIENT_CLINIC_OR_DEPARTMENT_OTHER): Payer: Self-pay

## 2021-12-26 DIAGNOSIS — N200 Calculus of kidney: Secondary | ICD-10-CM | POA: Diagnosis not present

## 2021-12-26 DIAGNOSIS — E559 Vitamin D deficiency, unspecified: Secondary | ICD-10-CM | POA: Diagnosis not present

## 2021-12-26 DIAGNOSIS — Z79891 Long term (current) use of opiate analgesic: Secondary | ICD-10-CM | POA: Diagnosis not present

## 2021-12-26 DIAGNOSIS — E039 Hypothyroidism, unspecified: Secondary | ICD-10-CM | POA: Diagnosis not present

## 2021-12-26 DIAGNOSIS — M62838 Other muscle spasm: Secondary | ICD-10-CM | POA: Diagnosis not present

## 2021-12-26 DIAGNOSIS — M4726 Other spondylosis with radiculopathy, lumbar region: Secondary | ICD-10-CM | POA: Diagnosis not present

## 2021-12-26 DIAGNOSIS — G894 Chronic pain syndrome: Secondary | ICD-10-CM | POA: Diagnosis not present

## 2021-12-26 DIAGNOSIS — E7800 Pure hypercholesterolemia, unspecified: Secondary | ICD-10-CM | POA: Diagnosis not present

## 2021-12-26 DIAGNOSIS — R7301 Impaired fasting glucose: Secondary | ICD-10-CM | POA: Diagnosis not present

## 2021-12-26 DIAGNOSIS — I10 Essential (primary) hypertension: Secondary | ICD-10-CM | POA: Diagnosis not present

## 2021-12-26 MED ORDER — OXYCODONE-ACETAMINOPHEN 10-325 MG PO TABS
1.0000 | ORAL_TABLET | ORAL | 0 refills | Status: DC | PRN
  Filled 2021-12-26 – 2022-01-12 (×2): qty 180, 30d supply, fill #0

## 2021-12-26 MED ORDER — FENTANYL 50 MCG/HR TD PT72
MEDICATED_PATCH | TRANSDERMAL | 0 refills | Status: DC
  Filled 2021-12-26 – 2022-01-12 (×2): qty 15, 30d supply, fill #0

## 2021-12-26 MED ORDER — TIZANIDINE HCL 4 MG PO TABS
4.0000 mg | ORAL_TABLET | Freq: Three times a day (TID) | ORAL | 1 refills | Status: DC
  Filled 2021-12-26: qty 270, 90d supply, fill #0
  Filled 2022-03-28: qty 270, 90d supply, fill #1

## 2022-01-02 ENCOUNTER — Ambulatory Visit (INDEPENDENT_AMBULATORY_CARE_PROVIDER_SITE_OTHER): Payer: Medicare Other

## 2022-01-02 DIAGNOSIS — Z0000 Encounter for general adult medical examination without abnormal findings: Secondary | ICD-10-CM

## 2022-01-02 NOTE — Patient Instructions (Signed)
Karen Brown , Thank you for taking time to come for your Medicare Wellness Visit. I appreciate your ongoing commitment to your health goals. Please review the following plan we discussed and let me know if I can assist you in the future.   Screening recommendations/referrals: Colonoscopy: postponed at this time  Mammogram: done 08/02/21 repeat every year  Bone Density: done 11/09/21 repeat every 2 years  Recommended yearly ophthalmology/optometry visit for glaucoma screening and checkup Recommended yearly dental visit for hygiene and checkup  Vaccinations: Influenza vaccine: done 03/23/21 repeat every year  Pneumococcal vaccine: due  Tdap vaccine: done 07/04/17 repeat very 10 years  Shingles vaccine: completed 08/12/21  &12/15/21 Covid-19: completed 3/29, 4/19 04/11/20, 5/13, 04/22/21  Advanced directives: Please bring a copy of your health care power of attorney and living will to the office at your convenience.  Conditions/risks identified: lose weight and change eating habits   Next appointment: Follow up in one year for your annual wellness visit.   Preventive Care 40-64 Years, Female Preventive care refers to lifestyle choices and visits with your health care provider that can promote health and wellness. What does preventive care include? A yearly physical exam. This is also called an annual well check. Dental exams once or twice a year. Routine eye exams. Ask your health care provider how often you should have your eyes checked. Personal lifestyle choices, including: Daily care of your teeth and gums. Regular physical activity. Eating a healthy diet. Avoiding tobacco and drug use. Limiting alcohol use. Practicing safe sex. Taking low-dose aspirin daily starting at age 76. Taking vitamin and mineral supplements as recommended by your health care provider. What happens during an annual well check? The services and screenings done by your health care provider during your annual well  check will depend on your age, overall health, lifestyle risk factors, and family history of disease. Counseling  Your health care provider may ask you questions about your: Alcohol use. Tobacco use. Drug use. Emotional well-being. Home and relationship well-being. Sexual activity. Eating habits. Work and work Statistician. Method of birth control. Menstrual cycle. Pregnancy history. Screening  You may have the following tests or measurements: Height, weight, and BMI. Blood pressure. Lipid and cholesterol levels. These may be checked every 5 years, or more frequently if you are over 40 years old. Skin check. Lung cancer screening. You may have this screening every year starting at age 13 if you have a 30-pack-year history of smoking and currently smoke or have quit within the past 15 years. Fecal occult blood test (FOBT) of the stool. You may have this test every year starting at age 19. Flexible sigmoidoscopy or colonoscopy. You may have a sigmoidoscopy every 5 years or a colonoscopy every 10 years starting at age 36. Hepatitis C blood test. Hepatitis B blood test. Sexually transmitted disease (STD) testing. Diabetes screening. This is done by checking your blood sugar (glucose) after you have not eaten for a while (fasting). You may have this done every 1-3 years. Mammogram. This may be done every 1-2 years. Talk to your health care provider about when you should start having regular mammograms. This may depend on whether you have a family history of breast cancer. BRCA-related cancer screening. This may be done if you have a family history of breast, ovarian, tubal, or peritoneal cancers. Pelvic exam and Pap test. This may be done every 3 years starting at age 46. Starting at age 71, this may be done every 5 years if you have  a Pap test in combination with an HPV test. Bone density scan. This is done to screen for osteoporosis. You may have this scan if you are at high risk for  osteoporosis. Discuss your test results, treatment options, and if necessary, the need for more tests with your health care provider. Vaccines  Your health care provider may recommend certain vaccines, such as: Influenza vaccine. This is recommended every year. Tetanus, diphtheria, and acellular pertussis (Tdap, Td) vaccine. You may need a Td booster every 10 years. Zoster vaccine. You may need this after age 8. Pneumococcal 13-valent conjugate (PCV13) vaccine. You may need this if you have certain conditions and were not previously vaccinated. Pneumococcal polysaccharide (PPSV23) vaccine. You may need one or two doses if you smoke cigarettes or if you have certain conditions. Talk to your health care provider about which screenings and vaccines you need and how often you need them. This information is not intended to replace advice given to you by your health care provider. Make sure you discuss any questions you have with your health care provider. Document Released: 06/24/2015 Document Revised: 02/15/2016 Document Reviewed: 03/29/2015 Elsevier Interactive Patient Education  2017 [place] Prevention in the Home Falls can cause injuries. They can happen to people of all ages. There are many things you can do to make your home safe and to help prevent falls. What can I do on the outside of my home? Regularly fix the edges of walkways and driveways and fix any cracks. Remove anything that might make you trip as you walk through a door, such as a raised step or threshold. Trim any bushes or trees on the path to your home. Use bright outdoor lighting. Clear any walking paths of anything that might make someone trip, such as rocks or tools. Regularly check to see if handrails are loose or broken. Make sure that both sides of any steps have handrails. Any raised decks and porches should have guardrails on the edges. Have any leaves, snow, or ice cleared regularly. Use sand or  salt on walking paths during winter. Clean up any spills in your garage right away. This includes oil or grease spills. What can I do in the bathroom? Use night lights. Install grab bars by the toilet and in the tub and shower. Do not use towel bars as grab bars. Use non-skid mats or decals in the tub or shower. If you need to sit down in the shower, use a plastic, non-slip stool. Keep the floor dry. Clean up any water that spills on the floor as soon as it happens. Remove soap buildup in the tub or shower regularly. Attach bath mats securely with double-sided non-slip rug tape. Do not have throw rugs and other things on the floor that can make you trip. What can I do in the bedroom? Use night lights. Make sure that you have a light by your bed that is easy to reach. Do not use any sheets or blankets that are too big for your bed. They should not hang down onto the floor. Have a firm chair that has side arms. You can use this for support while you get dressed. Do not have throw rugs and other things on the floor that can make you trip. What can I do in the kitchen? Clean up any spills right away. Avoid walking on wet floors. Keep items that you use a lot in easy-to-reach places. If you need to reach something above you, use  a strong step stool that has a grab bar. Keep electrical cords out of the way. Do not use floor polish or wax that makes floors slippery. If you must use wax, use non-skid floor wax. Do not have throw rugs and other things on the floor that can make you trip. What can I do with my stairs? Do not leave any items on the stairs. Make sure that there are handrails on both sides of the stairs and use them. Fix handrails that are broken or loose. Make sure that handrails are as long as the stairways. Check any carpeting to make sure that it is firmly attached to the stairs. Fix any carpet that is loose or worn. Avoid having throw rugs at the top or bottom of the stairs. If  you do have throw rugs, attach them to the floor with carpet tape. Make sure that you have a light switch at the top of the stairs and the bottom of the stairs. If you do not have them, ask someone to add them for you. What else can I do to help prevent falls? Wear shoes that: Do not have high heels. Have rubber bottoms. Are comfortable and fit you well. Are closed at the toe. Do not wear sandals. If you use a stepladder: Make sure that it is fully opened. Do not climb a closed stepladder. Make sure that both sides of the stepladder are locked into place. Ask someone to hold it for you, if possible. Clearly mark and make sure that you can see: Any grab bars or handrails. First and last steps. Where the edge of each step is. Use tools that help you move around (mobility aids) if they are needed. These include: Canes. Walkers. Scooters. Crutches. Turn on the lights when you go into a dark area. Replace any light bulbs as soon as they burn out. Set up your furniture so you have a clear path. Avoid moving your furniture around. If any of your floors are uneven, fix them. If there are any pets around you, be aware of where they are. Review your medicines with your doctor. Some medicines can make you feel dizzy. This can increase your chance of falling. Ask your doctor what other things that you can do to help prevent falls. This information is not intended to replace advice given to you by your health care provider. Make sure you discuss any questions you have with your health care provider. Document Released: 03/24/2009 Document Revised: 11/03/2015 Document Reviewed: 07/02/2014 Elsevier Interactive Patient Education  2017 [employer].

## 2022-01-02 NOTE — Progress Notes (Addendum)
Virtual Visit via Telephone Note  I connected with  Karen Brown on 01/02/22 at  1:00 PM EDT by telephone and verified that I am speaking with the correct person using two identifiers.  Medicare Annual Wellness visit completed telephonically due to Covid-19 pandemic.   Persons participating in this call: This Health Coach and this patient.   Location: Patient: home Provider: office    I discussed the limitations, risks, security and privacy concerns of performing an evaluation and management service by telephone and the availability of in person appointments. The patient expressed understanding and agreed to proceed.  Unable to perform video visit due to video visit attempted and failed and/or patient does not have video capability.   Some vital signs may be absent or patient reported.   Willette Brace, LPN   Subjective:   Karen Brown is a 61 y.o. female who presents for Medicare Annual (Subsequent) preventive examination.  Review of Systems     Cardiac Risk Factors include: advanced age (>60mn, >>15women);hypertension;obesity (BMI >30kg/m2)     Objective:    There were no vitals filed for this visit. There is no height or weight on file to calculate BMI.     01/02/2022    1:17 PM 12/13/2020    2:28 PM 07/24/2019    3:20 PM 03/19/2019    7:41 AM 07/22/2018    3:34 PM 07/04/2017    2:50 PM 07/02/2016    1:16 PM  Advanced Directives  Does Patient Have a Medical Advance Directive? Yes No No No No No No  Type of Advance Directive HArchiein Chart? No - copy requested        Would patient like information on creating a medical advance directive?  No - Patient declined No - Patient declined No - Patient declined Yes (MAU/Ambulatory/Procedural Areas - Information given) Yes (MAU/Ambulatory/Procedural Areas - Information given) Yes (MAU/Ambulatory/Procedural Areas - Information given)    Current Medications  (verified) Outpatient Encounter Medications as of 01/02/2022  Medication Sig   atenolol (TENORMIN) 25 MG tablet 1 tablet   AUVELITY 45-105 MG TBCR Take by mouth.   Cholecalciferol 50 MCG (2000 UT) CAPS Vitamin D3 50 mcg (2,000 unit) capsule   1 capsule every day by oral route.   clonazePAM (KLONOPIN) 1 MG tablet Take 1 mg by mouth 3 (three) times daily as needed.   estradiol (ESTRACE) 0.1 MG/GM vaginal cream Place vaginally.   fentaNYL (DURAGESIC) 50 MCG/HR Apply 1 patch to skin every 48 hours as directed   lamoTRIgine (LAMICTAL) 25 MG tablet Take 25 mg by mouth 2 (two) times daily. 1 in the morning and 1 in the evening   levothyroxine (SYNTHROID) 150 MCG tablet Take 1 tablet (150 mcg total) by mouth daily before breakfast.   lisinopril (PRINIVIL,ZESTRIL) 20 MG tablet Take 1 tablet by mouth daily.   OVER THE COUNTER MEDICATION Take 3 tablets by mouth daily. cartilast   oxyCODONE-acetaminophen (PERCOCET) 10-325 MG tablet Take 1 tablet by mouth daily. Will take up to 6 tabs a day   promethazine (PHENERGAN) 25 MG tablet Take 1 tablet (25 mg total) by mouth as needed.   tiZANidine (ZANAFLEX) 4 MG capsule Take 4 mg by mouth 3 (three) times daily as needed for muscle spasms.   butalbital-acetaminophen-caffeine (FIORICET) 50-325-40 MG tablet 1 po q4h prn (Patient not taking: Reported on 01/02/2022)   oxyCODONE-acetaminophen (PERCOCET) 10-325 MG tablet  Take 1 tablet by mouth every four hours as needed for pain   oxyCODONE-acetaminophen (PERCOCET) 10-325 MG tablet Take 1 tablet by mouth every 4 (four) hours as needed for pain.   tiZANidine (ZANAFLEX) 4 MG tablet Take 1 tablet (4 mg) by mouth 3 (three) times daily as needed   [DISCONTINUED] fentaNYL (DURAGESIC) 50 MCG/HR Place onto the skin.   No facility-administered encounter medications on file as of 01/02/2022.    Allergies (verified) Other, Biaxin [clarithromycin], Clindamycin/lincomycin, Ketorolac tromethamine, Pb-hyoscy-atropine-scopolamine,  Penicillins, Phenobarbital, Sulfonamide derivatives, Topiramate, and Zaleplon   History: Past Medical History:  Diagnosis Date   Anxiety    Bipolar 1 disorder ([place])    Chronic migraine    Chronically dry eyes    Colitis, ischemic ([place])    Complication of anesthesia    versed does not work as stated per pt    Depression    Diverticulosis    Dry mouth    Family history of adverse reaction to anesthesia    Fibromyalgia    GERD (gastroesophageal reflux disease)    Hemorrhoids    Hyperlipemia    Hypertension    Hypothyroidism    IBS (irritable bowel syndrome)    PVC (premature ventricular contraction)    Past Surgical History:  Procedure Laterality Date   ABDOMINAL HYSTERECTOMY     Partial    APPENDECTOMY     BACK SURGERY     Micro diskectomy   BACK SURGERY     L facet rhzotomy   CHOLECYSTECTOMY     EXTRACORPOREAL SHOCK WAVE LITHOTRIPSY Left 03/19/2019   Procedure: EXTRACORPOREAL SHOCK WAVE LITHOTRIPSY (ESWL);  Surgeon: Ceasar Mons, MD;  Location: [facility];  Service: Urology;  Laterality: Left;   TONSILLECTOMY     Family History  Problem Relation Age of Onset   Kidney disease Father    Coronary artery disease Father 25   COPD Father 73       copd   Heart disease Father    Colon polyps Mother    Osteoporosis Mother    Colon cancer Neg Hx    Social History   Socioeconomic History   Marital status: Married    Spouse name: Not on file   Number of children: 0   Years of education: Not on file   Highest education level: Not on file  Occupational History   Occupation: Disabled     Employer: [employer]  Tobacco Use   Smoking status: Never   Smokeless tobacco: Never  Vaping Use   Vaping Use: Never used  Substance and Sexual Activity   Alcohol use: No   Drug use: No   Sexual activity: Yes  Other Topics Concern   Not on file  Social History Narrative   Caffeine occ    Social Determinants of Health   Financial Resource Strain: Low Risk   (01/02/2022)   Overall Financial Resource Strain (CARDIA)    Difficulty of Paying Living Expenses: Not hard at all  Food Insecurity: No Food Insecurity (01/02/2022)   Hunger Vital Sign    Worried About Running Out of Food in the Last Year: Never true    Ran Out of Food in the Last Year: Never true  Transportation Needs: No Transportation Needs (01/02/2022)   PRAPARE - Hydrologist (Medical): No    Lack of Transportation (Non-Medical): No  Physical Activity: Inactive (01/02/2022)   Exercise Vital Sign    Days of Exercise per Week: 0 days  Minutes of Exercise per Session: 0 min  Stress: No Stress Concern Present (01/02/2022)   [place]    Feeling of Stress : Only a little  Social Connections: Moderately Integrated (01/02/2022)   Social Connection and Isolation Panel [NHANES]    Frequency of Communication with Friends and Family: More than three times a week    Frequency of Social Gatherings with Friends and Family: Once a week    Attends Religious Services: 1 to 4 times per year    Active Member of [employer] or Organizations: No    Attends Music therapist: Never    Marital Status: Married    Tobacco Counseling Counseling given: Not Answered   Clinical Intake:  Pre-visit preparation completed: Yes  Pain : No/denies pain     BMI - recorded: 34.68 Nutritional Status: BMI > 30  Obese Nutritional Risks: None Diabetes: No  How often do you need to have someone help you when you read instructions, pamphlets, or other written materials from your doctor or pharmacy?: 1 - Never  Diabetic?no  Interpreter Needed?: No  Information entered by :: Charlott Rakes, LPN   Activities of Daily Living    01/02/2022    1:18 PM  In your present state of health, do you have any difficulty performing the following activities:  Hearing? 0  Vision? 0  Difficulty concentrating or making  decisions? 0  Walking or climbing stairs? 0  Dressing or bathing? 0  Doing errands, shopping? 0  Preparing Food and eating ? N  Using the Toilet? N  In the past six months, have you accidently leaked urine? N  Do you have problems with loss of bowel control? N  Managing your Medications? N  Managing your Finances? N  Housekeeping or managing your Housekeeping? N    Patient Care Team: Carollee Herter, Alferd Apa, DO as PCP - General Nicholaus Bloom, MD as Consulting Physician (Anesthesiology) Jacelyn Pi, MD as Consulting Physician (Endocrinology) Phylliss Bob, MD as Consulting Physician (Orthopedic Surgery) Chucky May, MD as Consulting Physician (Psychiatry)  Indicate any recent Medical Services you may have received from other than [facility] providers in the past year (date may be approximate).     Assessment:   This is a routine wellness examination for Karen Brown.  Hearing/Vision screen Hearing Screening - Comments:: Pt denies any hearing issues  Vision Screening - Comments:: Pt follows up with [facility]   Dietary issues and exercise activities discussed: Current Exercise Habits: The patient does not participate in regular exercise at present   Goals Addressed             This Visit's Progress    Patient Stated       Losing weight and change eating habits        Depression Screen    01/02/2022    1:15 PM 12/13/2020    2:31 PM 07/24/2019    3:27 PM 07/22/2018    5:57 PM 07/22/2018    3:35 PM 07/04/2017    2:53 PM 07/02/2016    1:19 PM  PHQ 2/9 Scores  PHQ - 2 Score 1 1 0 '4 1 1 '$ 0  PHQ- 9 Score 4   11       Fall Risk    01/02/2022    1:17 PM 12/13/2020    2:30 PM 07/24/2019    3:26 PM 07/22/2018    3:35 PM 07/04/2017    2:53 PM  Fall Risk  Falls in the past year? 0 0 0 0 Yes  Number falls in past yr: 0 0 0  1  Injury with Fall? 0 0 0    Risk for fall due to : Impaired vision      Follow up Falls prevention discussed Falls prevention discussed  Education provided;Falls prevention discussed  Education provided;Falls prevention discussed    FALL RISK PREVENTION PERTAINING TO THE HOME:  Any stairs in or around the home? No  If so, are there any without handrails? No  Home free of loose throw rugs in walkways, pet beds, electrical cords, etc? Yes  Adequate lighting in your home to reduce risk of falls? Yes   ASSISTIVE DEVICES UTILIZED TO PREVENT FALLS:  Life alert? No  Use of a cane, walker or w/c? No  Grab bars in the bathroom? No  Shower chair or bench in shower? Yes  Elevated toilet seat or a handicapped toilet? No   TIMED UP AND GO:  Was the test performed? No .   Cognitive Function:    07/02/2016    1:20 PM  MMSE - Mini Mental State Exam  Orientation to time 5  Orientation to Place 5  Registration 3  Attention/ Calculation 5  Recall 3  Language- name 2 objects 2  Language- repeat 1  Language- follow 3 step command 3  Language- read & follow direction 1  Write a sentence 1  Copy design 1  Total score 30        01/02/2022    1:19 PM  6CIT Screen  What Year? 0 points  What month? 0 points  What time? 0 points  Count back from 20 0 points  Months in reverse 0 points  Repeat phrase 0 points  Total Score 0 points    Immunizations Immunization History  Administered Date(s) Administered   Influenza Inj Mdck Quad Pf 04/04/2017   Influenza Split 04/09/2011, 04/03/2012   Influenza Whole 03/15/2010   Influenza,inj,Quad PF,6+ Mos 04/07/2013, 03/26/2014, 03/22/2015, 04/13/2016, 02/26/2019   Influenza-Unspecified 04/04/2017, 04/09/2018, 03/23/2021   PFIZER(Purple Top)SARS-COV-2 Vaccination 09/07/2019, 09/28/2019, 04/11/2020, 10/21/2020   Pfizer Covid-19 Vaccine Bivalent Booster 93yr & up 04/22/2021   Td 01/23/2007   Tdap 07/04/2017   Unspecified SARS-COV-2 Vaccination 09/07/2019, 09/28/2019, 04/11/2020, 10/21/2020, 04/22/2021   Zoster Recombinat (Shingrix) 08/12/2021, 12/15/2021    TDAP status: Up to  date  Flu Vaccine status: Up to date  Pneumococcal vaccine status: Up to date  Covid-19 vaccine status: Completed vaccines  Qualifies for Shingles Vaccine? Yes   Zostavax completed Yes   Shingrix Completed?: Yes  Screening Tests Health Maintenance  Topic Date Due   COLONOSCOPY (Pts [phone] coverage will need to be confirmed)  01/03/2023 (Originally 12/10/2019)   Hepatitis C Screening  07/05/2023 (Originally 03/11/1979)   HIV Screening  07/05/2023 (Originally 03/11/1976)   INFLUENZA VACCINE  01/09/2022   MAMMOGRAM  08/02/2022   TETANUS/TDAP  07/05/2027   COVID-19 Vaccine  Completed   Zoster Vaccines- Shingrix  Completed   HPV VACCINES  Aged Out    Health Maintenance  There are no preventive care reminders to display for this patient.   Colorectal cancer screening: Type of screening: Colonoscopy. Completed 12/09/09. Repeat every 10 years  Mammogram status: Completed 08/02/21. Repeat every year  Bone density 11/09/21   Additional Screening:  Hepatitis C Screening: does qualify;  Vision Screening: Recommended annual ophthalmology exams for early detection of glaucoma and other disorders of the eye. Is the patient up to date with their annual  eye exam?  Yes  Who is the provider or what is the name of the office in which the patient attends annual eye exams? [facility] If pt is not established with a provider, would they like to be referred to a provider to establish care? No .   Dental Screening: Recommended annual dental exams for proper oral hygiene  Community Resource Referral / Chronic Care Management: CRR required this visit?  No   CCM required this visit?  No      Plan:     I have personally reviewed and noted the following in the patient's chart:   Medical and social history Use of alcohol, tobacco or illicit drugs  Current medications and supplements including opioid prescriptions.  Functional ability and status Nutritional status Physical  activity Advanced directives List of other physicians Hospitalizations, surgeries, and ER visits in previous 12 months Vitals Screenings to include cognitive, depression, and falls Referrals and appointments  In addition, I have reviewed and discussed with patient certain preventive protocols, quality metrics, and best practice recommendations. A written personalized care plan for preventive services as well as general preventive health recommendations were provided to patient.    Willette Brace, LPN   [id number]   Nurse Notes: none

## 2022-01-11 ENCOUNTER — Other Ambulatory Visit (HOSPITAL_BASED_OUTPATIENT_CLINIC_OR_DEPARTMENT_OTHER): Payer: Self-pay

## 2022-01-12 ENCOUNTER — Other Ambulatory Visit (HOSPITAL_BASED_OUTPATIENT_CLINIC_OR_DEPARTMENT_OTHER): Payer: Self-pay

## 2022-01-16 DIAGNOSIS — S300XXA Contusion of lower back and pelvis, initial encounter: Secondary | ICD-10-CM | POA: Diagnosis not present

## 2022-01-16 DIAGNOSIS — W19XXXA Unspecified fall, initial encounter: Secondary | ICD-10-CM | POA: Diagnosis not present

## 2022-01-16 DIAGNOSIS — W1830XA Fall on same level, unspecified, initial encounter: Secondary | ICD-10-CM | POA: Diagnosis not present

## 2022-01-16 DIAGNOSIS — S32019A Unspecified fracture of first lumbar vertebra, initial encounter for closed fracture: Secondary | ICD-10-CM | POA: Diagnosis not present

## 2022-01-16 DIAGNOSIS — M25572 Pain in left ankle and joints of left foot: Secondary | ICD-10-CM | POA: Diagnosis not present

## 2022-01-16 DIAGNOSIS — S32009A Unspecified fracture of unspecified lumbar vertebra, initial encounter for closed fracture: Secondary | ICD-10-CM | POA: Diagnosis not present

## 2022-01-16 DIAGNOSIS — M545 Low back pain, unspecified: Secondary | ICD-10-CM | POA: Diagnosis not present

## 2022-01-16 DIAGNOSIS — Y92008 Other place in unspecified non-institutional (private) residence as the place of occurrence of the external cause: Secondary | ICD-10-CM | POA: Diagnosis not present

## 2022-01-16 DIAGNOSIS — M546 Pain in thoracic spine: Secondary | ICD-10-CM | POA: Diagnosis not present

## 2022-01-16 DIAGNOSIS — M79661 Pain in right lower leg: Secondary | ICD-10-CM | POA: Diagnosis not present

## 2022-01-16 DIAGNOSIS — W1839XA Other fall on same level, initial encounter: Secondary | ICD-10-CM | POA: Diagnosis not present

## 2022-01-19 ENCOUNTER — Telehealth: Payer: Self-pay | Admitting: Family Medicine

## 2022-01-19 NOTE — Telephone Encounter (Signed)
Patient given instructions per triage nurse, see at bottom.   [facility] Day - Client TELEPHONE ADVICE RECORD AccessNurse Patient Name:Vanellope Heinze Initial Comment Caller states that she is currently in [place]. She fell down a flight of stairs and fractured her vertebrae. She needs to know the safest way to travel back in the car. Nurse: Claiborne Billings, RN, Kim Date/Time (Eastern Time): 01/19/2022 10:06:05 AM Confirm and document reason for call. If symptomatic, describe symptoms. ---Caller states that she is currently in [place]. She fell down a flight of stairs on 8/2 and fxd her L1 vertebrae. She tried to treat sxs at home but ended up going to ER on 8/8. She has been taking her ongoing pain med that she was already taking for CBP. She is leaving [place] later today to drive home and wants to know the safest way to travel with fx; stopping halfway to stay somewhere so will be in car 3 hrs today, 3 hrs tomorrow. Does the patient have any new or worsening symptoms? ---Yes Will a triage be completed? ---Yes Related visit to physician within the last 2 weeks? ---Yes Does the PT have any chronic conditions? (i.e. diabetes, asthma, this includes High risk factors for pregnancy, etc.) ---Yes List chronic conditions. ---CBP, Arthritis, HTN Is this a behavioral health or substance abuse call? ---No Guidelines Guideline Title Affirmed Question Affirmed Notes Nurse Date/Time (Eastern Time) Back Injury [1] MODERATE pain (e.g., interferes with normal activities) AND [2] high-risk adult Suzette Battiest 01/19/2022 10:10:38 AM User: Suezanne Jacquet, RN Date/Time (Eastern Time): 01/19/2022 10:10:27 AM Current pain level is 3-4/10, currently laying in bed. User: Suezanne Jacquet, RN Date/Time Eilene Ghazi Time): 01/19/2022 10:24:22 AM Caller informed nurse that her husband wants her to lay across the back seat while traveling home; nurse advised that I cannot recommend that as the law requires her to wear a  seat belt. Nurse recommended lots of pillows for positioning, taking pain med 30 min to 1 hr before traveling, and riding in the front passenger seat which she can recline to a certain degree for comfort while still wearing seat belt. Advised her if at any time she experiences worsening or severe pain to have her husband stop and call back or go to ED and she verbalizes understanding.

## 2022-01-19 NOTE — Telephone Encounter (Signed)
Patient fell down some stairs and and fractured a vertabrae. She went to the ED last night. Patient is not in town and wants to know how to best travel home in the car.  Transferred to triage to assist

## 2022-01-23 ENCOUNTER — Other Ambulatory Visit (HOSPITAL_BASED_OUTPATIENT_CLINIC_OR_DEPARTMENT_OTHER): Payer: Self-pay

## 2022-01-23 ENCOUNTER — Telehealth (INDEPENDENT_AMBULATORY_CARE_PROVIDER_SITE_OTHER): Payer: Medicare Other | Admitting: Family Medicine

## 2022-01-23 ENCOUNTER — Encounter: Payer: Self-pay | Admitting: Family Medicine

## 2022-01-23 DIAGNOSIS — B356 Tinea cruris: Secondary | ICD-10-CM | POA: Diagnosis not present

## 2022-01-23 DIAGNOSIS — S32019A Unspecified fracture of first lumbar vertebra, initial encounter for closed fracture: Secondary | ICD-10-CM | POA: Insufficient documentation

## 2022-01-23 DIAGNOSIS — G43809 Other migraine, not intractable, without status migrainosus: Secondary | ICD-10-CM

## 2022-01-23 MED ORDER — CLOTRIMAZOLE-BETAMETHASONE 1-0.05 % EX CREA
1.0000 | TOPICAL_CREAM | Freq: Two times a day (BID) | CUTANEOUS | 0 refills | Status: AC
  Filled 2022-01-23: qty 30, 15d supply, fill #0

## 2022-01-23 MED ORDER — BUTALBITAL-APAP-CAFFEINE 50-325-40 MG PO TABS
ORAL_TABLET | ORAL | 0 refills | Status: DC
  Filled 2022-01-23: qty 20, 4d supply, fill #0

## 2022-01-23 NOTE — Progress Notes (Addendum)
Subjective:   By signing my name below, I, [name], attest that this documentation has been prepared under the direction and in the presence of Dr. Roma Schanz, DO. 01/23/2022    Patient ID: Karen Brown, female    DOB: [birth year], 61 y.o.   MRN: [id number]  No chief complaint on file.   HPI Patient is in today for a ED follow up video visit.   She was admitted to the ED on 01/16/2022 for closed fracture of transverse process of lumbar vertebra. She was given 500 mg methocarbamol 2x daily to manage her pain. While in the ED she was given and X-ray of her ankle, CT of her lumbar spine without contrast, CT of thoracic spine without contrast, X-ray of tibia fibula, X-ray of pelvis.  XR ANKLE LEFT 3 OR MORE VIEWS:  No acute osseous abnormality. CT LUMBAR SPINE WO CONTRAST: 1. No acute fracture or traumatic subluxation. 2. Chronic degenerative changes as discussed above. Consider lumbar spine MRI or lumbar spine CT myelogram for more specific evaluation, in the nonemergent setting. CT thoracic spine without contrast: 1. No acute fracture of the thoracic spine. 2. Bilateral transverse process fractures at L1. 3. Small left pleural effusion. X-ray Right tibia and fibula: No evidence of acute fracture.  She reports falling down on the stairs while out of town in [place]. She did not go to the ED immediately after the fall. She went to the ED 6 days following the fall.  She reports developing a rash in her groin area.    Past Medical History:  Diagnosis Date   Anxiety    Bipolar 1 disorder ([place])    Chronic migraine    Chronically dry eyes    Colitis, ischemic ([place])    Complication of anesthesia    versed does not work as stated per pt    Depression    Diverticulosis    Dry mouth    Family history of adverse reaction to anesthesia    Fibromyalgia    GERD (gastroesophageal reflux disease)    Hemorrhoids    Hyperlipemia    Hypertension    Hypothyroidism    IBS  (irritable bowel syndrome)    PVC (premature ventricular contraction)     Past Surgical History:  Procedure Laterality Date   ABDOMINAL HYSTERECTOMY     Partial    APPENDECTOMY     BACK SURGERY     Micro diskectomy   BACK SURGERY     L facet rhzotomy   CHOLECYSTECTOMY     EXTRACORPOREAL SHOCK WAVE LITHOTRIPSY Left 03/19/2019   Procedure: EXTRACORPOREAL SHOCK WAVE LITHOTRIPSY (ESWL);  Surgeon: Ceasar Mons, MD;  Location: [facility];  Service: Urology;  Laterality: Left;   TONSILLECTOMY      Family History  Problem Relation Age of Onset   Kidney disease Father    Coronary artery disease Father 10   COPD Father 28       copd   Heart disease Father    Colon polyps Mother    Osteoporosis Mother    Colon cancer Neg Hx     Social History   Socioeconomic History   Marital status: Married    Spouse name: Not on file   Number of children: 0   Years of education: Not on file   Highest education level: Not on file  Occupational History   Occupation: Disabled     Employer: [employer]  Tobacco Use   Smoking status: Never   Smokeless tobacco:  Never  Vaping Use   Vaping Use: Never used  Substance and Sexual Activity   Alcohol use: No   Drug use: No   Sexual activity: Yes  Other Topics Concern   Not on file  Social History Narrative   Caffeine occ    Social Determinants of Health   Financial Resource Strain: Low Risk  (01/02/2022)   Overall Financial Resource Strain (CARDIA)    Difficulty of Paying Living Expenses: Not hard at all  Food Insecurity: No Food Insecurity (01/02/2022)   Hunger Vital Sign    Worried About Running Out of Food in the Last Year: Never true    Ran Out of Food in the Last Year: Never true  Transportation Needs: No Transportation Needs (01/02/2022)   PRAPARE - Hydrologist (Medical): No    Lack of Transportation (Non-Medical): No  Physical Activity: Inactive (01/02/2022)   Exercise Vital Sign    Days of  Exercise per Week: 0 days    Minutes of Exercise per Session: 0 min  Stress: No Stress Concern Present (01/02/2022)   [place]    Feeling of Stress : Only a little  Social Connections: Moderately Integrated (01/02/2022)   Social Connection and Isolation Panel [NHANES]    Frequency of Communication with Friends and Family: More than three times a week    Frequency of Social Gatherings with Friends and Family: Once a week    Attends Religious Services: 1 to 4 times per year    Active Member of [employer] or Organizations: No    Attends Archivist Meetings: Never    Marital Status: Married  Human resources officer Violence: Not At Risk (01/02/2022)   Humiliation, Afraid, Rape, and Kick questionnaire    Fear of Current or Ex-Partner: No    Emotionally Abused: No    Physically Abused: No    Sexually Abused: No    Outpatient Medications Prior to Visit  Medication Sig Dispense Refill   atenolol (TENORMIN) 25 MG tablet 1 tablet     AUVELITY 45-105 MG TBCR Take by mouth.     Cholecalciferol 50 MCG (2000 UT) CAPS Vitamin D3 50 mcg (2,000 unit) capsule   1 capsule every day by oral route.     clonazePAM (KLONOPIN) 1 MG tablet Take 1 mg by mouth 3 (three) times daily as needed.     estradiol (ESTRACE) 0.1 MG/GM vaginal cream Place vaginally.     fentaNYL (DURAGESIC) 50 MCG/HR Apply 1 patch to skin every 48 hours as directed 15 patch 0   lamoTRIgine (LAMICTAL) 25 MG tablet Take 25 mg by mouth 2 (two) times daily. 1 in the morning and 1 in the evening     levothyroxine (SYNTHROID) 150 MCG tablet Take 1 tablet (150 mcg total) by mouth daily before breakfast. 30 tablet 2   lisinopril (PRINIVIL,ZESTRIL) 20 MG tablet Take 1 tablet by mouth daily.     OVER THE COUNTER MEDICATION Take 3 tablets by mouth daily. cartilast     oxyCODONE-acetaminophen (PERCOCET) 10-325 MG tablet Take 1 tablet by mouth daily. Will take up to 6 tabs a day  0    oxyCODONE-acetaminophen (PERCOCET) 10-325 MG tablet Take 1 tablet by mouth every four hours as needed for pain 180 tablet 0   oxyCODONE-acetaminophen (PERCOCET) 10-325 MG tablet Take 1 tablet by mouth every 4 (four) hours as needed for pain. 180 tablet 0   promethazine (PHENERGAN) 25 MG tablet Take  1 tablet (25 mg total) by mouth as needed. 90 tablet 0   tiZANidine (ZANAFLEX) 4 MG capsule Take 4 mg by mouth 3 (three) times daily as needed for muscle spasms.     tiZANidine (ZANAFLEX) 4 MG tablet Take 1 tablet (4 mg) by mouth 3 (three) times daily as needed 270 tablet 1   butalbital-acetaminophen-caffeine (FIORICET) 50-325-40 MG tablet 1 po q4h prn (Patient not taking: Reported on 01/02/2022) 20 tablet 0   No facility-administered medications prior to visit.    Allergies  Allergen Reactions   Other Other (See Comments)    Oral steroids; skin sensitivity   Biaxin [Clarithromycin] Other (See Comments)    Bad taste and ringing in ear with dec hearing   Clindamycin/Lincomycin Dermatitis   Ketorolac Tromethamine    Pb-Hyoscy-Atropine-Scopolamine    Penicillins     REACTION: HIVES   Phenobarbital    Sulfonamide Derivatives    Topiramate    Zaleplon     Review of Systems  Constitutional:  Negative for fever and malaise/fatigue.  HENT:  Negative for congestion.   Eyes:  Negative for blurred vision.  Respiratory:  Negative for shortness of breath.   Cardiovascular:  Negative for chest pain, palpitations and leg swelling.  Gastrointestinal:  Negative for abdominal pain, blood in stool and nausea.  Genitourinary:  Negative for dysuria and frequency.  Musculoskeletal:  Positive for back pain. Negative for falls.  Skin:  Positive for rash (rash in groin area).  Neurological:  Negative for dizziness, loss of consciousness and headaches.  Endo/Heme/Allergies:  Negative for environmental allergies.  Psychiatric/Behavioral:  Negative for depression. The patient is not nervous/anxious.         Objective:    Physical Exam Vitals and nursing note reviewed.  Constitutional:      Appearance: She is well-developed.  Eyes:     Conjunctiva/sclera: Conjunctivae normal.  Neck:     Thyroid: No thyromegaly.     Vascular: No carotid bruit or JVD.  Pulmonary:     Effort: No respiratory distress.     Breath sounds: No wheezing or rales.  Chest:     Chest wall: No tenderness.  Musculoskeletal:     Cervical back: Normal range of motion and neck supple.  Skin:    Findings: Rash present.  Neurological:     Mental Status: She is alert and oriented to person, place, and time.    There were no vitals taken for this visit. Wt Readings from Last 3 Encounters:  11/02/21 208 lb 6.4 oz (94.5 kg)  05/30/21 219 lb 6.4 oz (99.5 kg)  01/29/21 216 lb (98 kg)    Diabetic Foot Exam - Simple   No data filed    Lab Results  Component Value Date   WBC 10.0 02/26/2019   HGB 14.7 02/26/2019   HCT 45.0 02/26/2019   PLT 340.0 02/26/2019   GLUCOSE 88 11/01/2020   CHOL 356 (H) 11/01/2020   TRIG (H) 11/01/2020    520.0 Triglyceride is over 400; calculations on Lipids are invalid.   HDL 59.10 11/01/2020   LDLDIRECT 223.0 11/01/2020   LDLCALC 190 (H) 06/27/2015   ALT 17 11/01/2020   AST 17 11/01/2020   NA 137 11/01/2020   K 4.3 11/01/2020   CL 96 11/01/2020   CREATININE 1.18 11/01/2020   BUN 25 (H) 11/01/2020   CO2 32 11/01/2020   TSH 4.67 (H) 02/26/2019   HGBA1C 5.4 08/15/2007   MICROALBUR <0.7 02/26/2019    Lab Results  Component  Value Date   TSH 4.67 (H) 02/26/2019   Lab Results  Component Value Date   WBC 10.0 02/26/2019   HGB 14.7 02/26/2019   HCT 45.0 02/26/2019   MCV 87.5 02/26/2019   PLT 340.0 02/26/2019   Lab Results  Component Value Date   NA 137 11/01/2020   K 4.3 11/01/2020   CO2 32 11/01/2020   GLUCOSE 88 11/01/2020   BUN 25 (H) 11/01/2020   CREATININE 1.18 11/01/2020   BILITOT 0.3 11/01/2020   ALKPHOS 95 11/01/2020   AST 17 11/01/2020   ALT 17  11/01/2020   PROT 8.0 11/01/2020   ALBUMIN 4.8 11/01/2020   CALCIUM 10.3 11/01/2020   GFR 49.80 (L) 11/01/2020   Lab Results  Component Value Date   CHOL 356 (H) 11/01/2020   Lab Results  Component Value Date   HDL 59.10 11/01/2020   Lab Results  Component Value Date   LDLCALC 190 (H) 06/27/2015   Lab Results  Component Value Date   TRIG (H) 11/01/2020    520.0 Triglyceride is over 400; calculations on Lipids are invalid.   Lab Results  Component Value Date   CHOLHDL 6 11/01/2020   Lab Results  Component Value Date   HGBA1C 5.4 08/15/2007       Assessment & Plan:   Problem List Items Addressed This Visit       Unprioritized   Migraine   Relevant Medications   butalbital-acetaminophen-caffeine (FIORICET) 50-325-40 MG tablet   Tinea cruris    Refill lotrisone      Relevant Medications   clotrimazole-betamethasone (LOTRISONE) cream   Closed fracture of first lumbar vertebra ([place]) - Primary    Refer to ortho for f/u  Med rec from ER in [place] reviewed       Relevant Orders   Ambulatory referral to Orthopedic Surgery     Meds ordered this encounter  Medications   clotrimazole-betamethasone (LOTRISONE) cream    Sig: Apply 1 Application topically 2 (two) times daily.    Dispense:  30 g    Refill:  0   butalbital-acetaminophen-caffeine (FIORICET) 50-325-40 MG tablet    Sig: Take 1 tablet by mouth every 4 hours as needed    Dispense:  20 tablet    Refill:  0    I, Ann Held, DO, personally preformed the services described in this documentation.  All medical record entries made by the scribe were at my direction and in my presence.  I have reviewed the chart and discharge instructions (if applicable) and agree that the record reflects my personal performance and is accurate and complete. 01/23/2022   I,[name],acting as a scribe for Ann Held, DO.,have documented all relevant documentation on the behalf of Ann Held,  DO,as directed by  Ann Held, DO while in the presence of Ann Held, DO.   Ann Held, DO

## 2022-01-23 NOTE — Assessment & Plan Note (Signed)
Refer to ortho for f/u  Med rec from ER in [place] reviewed

## 2022-01-23 NOTE — Assessment & Plan Note (Signed)
Refill lotrisone

## 2022-01-24 ENCOUNTER — Other Ambulatory Visit (HOSPITAL_BASED_OUTPATIENT_CLINIC_OR_DEPARTMENT_OTHER): Payer: Self-pay

## 2022-01-29 DIAGNOSIS — S32019B Unspecified fracture of first lumbar vertebra, initial encounter for open fracture: Secondary | ICD-10-CM | POA: Diagnosis not present

## 2022-01-29 DIAGNOSIS — I10 Essential (primary) hypertension: Secondary | ICD-10-CM | POA: Diagnosis not present

## 2022-01-29 DIAGNOSIS — M47816 Spondylosis without myelopathy or radiculopathy, lumbar region: Secondary | ICD-10-CM | POA: Diagnosis not present

## 2022-01-29 DIAGNOSIS — M549 Dorsalgia, unspecified: Secondary | ICD-10-CM | POA: Diagnosis not present

## 2022-02-06 ENCOUNTER — Other Ambulatory Visit (HOSPITAL_BASED_OUTPATIENT_CLINIC_OR_DEPARTMENT_OTHER): Payer: Self-pay

## 2022-02-06 DIAGNOSIS — M4726 Other spondylosis with radiculopathy, lumbar region: Secondary | ICD-10-CM | POA: Diagnosis not present

## 2022-02-06 DIAGNOSIS — Z79891 Long term (current) use of opiate analgesic: Secondary | ICD-10-CM | POA: Diagnosis not present

## 2022-02-06 DIAGNOSIS — G894 Chronic pain syndrome: Secondary | ICD-10-CM | POA: Diagnosis not present

## 2022-02-06 DIAGNOSIS — S32009S Unspecified fracture of unspecified lumbar vertebra, sequela: Secondary | ICD-10-CM | POA: Diagnosis not present

## 2022-02-06 MED ORDER — FENTANYL 50 MCG/HR TD PT72
MEDICATED_PATCH | TRANSDERMAL | 0 refills | Status: DC
  Filled 2022-02-09: qty 15, 30d supply, fill #0

## 2022-02-06 MED ORDER — OXYCODONE-ACETAMINOPHEN 10-325 MG PO TABS
ORAL_TABLET | ORAL | 0 refills | Status: DC
  Filled 2022-02-09: qty 180, 30d supply, fill #0

## 2022-02-08 ENCOUNTER — Other Ambulatory Visit (HOSPITAL_BASED_OUTPATIENT_CLINIC_OR_DEPARTMENT_OTHER): Payer: Self-pay

## 2022-02-09 ENCOUNTER — Other Ambulatory Visit (HOSPITAL_BASED_OUTPATIENT_CLINIC_OR_DEPARTMENT_OTHER): Payer: Self-pay

## 2022-02-26 DIAGNOSIS — M47816 Spondylosis without myelopathy or radiculopathy, lumbar region: Secondary | ICD-10-CM | POA: Diagnosis not present

## 2022-02-26 DIAGNOSIS — S32010A Wedge compression fracture of first lumbar vertebra, initial encounter for closed fracture: Secondary | ICD-10-CM | POA: Diagnosis not present

## 2022-02-26 DIAGNOSIS — I10 Essential (primary) hypertension: Secondary | ICD-10-CM | POA: Diagnosis not present

## 2022-03-06 ENCOUNTER — Other Ambulatory Visit (HOSPITAL_BASED_OUTPATIENT_CLINIC_OR_DEPARTMENT_OTHER): Payer: Self-pay

## 2022-03-06 DIAGNOSIS — M4726 Other spondylosis with radiculopathy, lumbar region: Secondary | ICD-10-CM | POA: Diagnosis not present

## 2022-03-06 DIAGNOSIS — G894 Chronic pain syndrome: Secondary | ICD-10-CM | POA: Diagnosis not present

## 2022-03-06 DIAGNOSIS — S32009S Unspecified fracture of unspecified lumbar vertebra, sequela: Secondary | ICD-10-CM | POA: Diagnosis not present

## 2022-03-06 DIAGNOSIS — Z79891 Long term (current) use of opiate analgesic: Secondary | ICD-10-CM | POA: Diagnosis not present

## 2022-03-06 MED ORDER — OXYCODONE-ACETAMINOPHEN 10-325 MG PO TABS
1.0000 | ORAL_TABLET | ORAL | 0 refills | Status: DC | PRN
  Filled 2022-03-12: qty 180, 30d supply, fill #0

## 2022-03-06 MED ORDER — FENTANYL 50 MCG/HR TD PT72
1.0000 | MEDICATED_PATCH | TRANSDERMAL | 0 refills | Status: DC
  Filled 2022-03-06 – 2022-03-12 (×2): qty 15, 30d supply, fill #0

## 2022-03-12 ENCOUNTER — Other Ambulatory Visit (HOSPITAL_BASED_OUTPATIENT_CLINIC_OR_DEPARTMENT_OTHER): Payer: Self-pay

## 2022-03-13 DIAGNOSIS — M4856XA Collapsed vertebra, not elsewhere classified, lumbar region, initial encounter for fracture: Secondary | ICD-10-CM | POA: Diagnosis not present

## 2022-03-13 DIAGNOSIS — M47816 Spondylosis without myelopathy or radiculopathy, lumbar region: Secondary | ICD-10-CM | POA: Diagnosis not present

## 2022-03-13 DIAGNOSIS — S32010A Wedge compression fracture of first lumbar vertebra, initial encounter for closed fracture: Secondary | ICD-10-CM | POA: Diagnosis not present

## 2022-03-13 DIAGNOSIS — R609 Edema, unspecified: Secondary | ICD-10-CM | POA: Diagnosis not present

## 2022-03-16 DIAGNOSIS — I10 Essential (primary) hypertension: Secondary | ICD-10-CM | POA: Diagnosis not present

## 2022-03-16 DIAGNOSIS — M47816 Spondylosis without myelopathy or radiculopathy, lumbar region: Secondary | ICD-10-CM | POA: Diagnosis not present

## 2022-03-16 DIAGNOSIS — S32010A Wedge compression fracture of first lumbar vertebra, initial encounter for closed fracture: Secondary | ICD-10-CM | POA: Diagnosis not present

## 2022-03-28 ENCOUNTER — Other Ambulatory Visit (HOSPITAL_BASED_OUTPATIENT_CLINIC_OR_DEPARTMENT_OTHER): Payer: Self-pay

## 2022-04-04 ENCOUNTER — Other Ambulatory Visit (HOSPITAL_BASED_OUTPATIENT_CLINIC_OR_DEPARTMENT_OTHER): Payer: Self-pay

## 2022-04-04 DIAGNOSIS — M4726 Other spondylosis with radiculopathy, lumbar region: Secondary | ICD-10-CM | POA: Diagnosis not present

## 2022-04-04 DIAGNOSIS — Z79891 Long term (current) use of opiate analgesic: Secondary | ICD-10-CM | POA: Diagnosis not present

## 2022-04-04 DIAGNOSIS — S32009S Unspecified fracture of unspecified lumbar vertebra, sequela: Secondary | ICD-10-CM | POA: Diagnosis not present

## 2022-04-04 DIAGNOSIS — G894 Chronic pain syndrome: Secondary | ICD-10-CM | POA: Diagnosis not present

## 2022-04-04 MED ORDER — FENTANYL 50 MCG/HR TD PT72
1.0000 | MEDICATED_PATCH | TRANSDERMAL | 0 refills | Status: DC
  Filled 2022-04-04 – 2022-04-11 (×2): qty 15, 30d supply, fill #0

## 2022-04-04 MED ORDER — OXYCODONE-ACETAMINOPHEN 10-325 MG PO TABS
1.0000 | ORAL_TABLET | ORAL | 0 refills | Status: DC
  Filled 2022-04-04 – 2022-04-11 (×2): qty 180, 30d supply, fill #0

## 2022-04-11 ENCOUNTER — Other Ambulatory Visit (HOSPITAL_BASED_OUTPATIENT_CLINIC_OR_DEPARTMENT_OTHER): Payer: Self-pay

## 2022-05-01 ENCOUNTER — Other Ambulatory Visit (HOSPITAL_BASED_OUTPATIENT_CLINIC_OR_DEPARTMENT_OTHER): Payer: Self-pay

## 2022-05-01 DIAGNOSIS — M4726 Other spondylosis with radiculopathy, lumbar region: Secondary | ICD-10-CM | POA: Diagnosis not present

## 2022-05-01 DIAGNOSIS — Z79891 Long term (current) use of opiate analgesic: Secondary | ICD-10-CM | POA: Diagnosis not present

## 2022-05-01 DIAGNOSIS — G894 Chronic pain syndrome: Secondary | ICD-10-CM | POA: Diagnosis not present

## 2022-05-01 DIAGNOSIS — S32009S Unspecified fracture of unspecified lumbar vertebra, sequela: Secondary | ICD-10-CM | POA: Diagnosis not present

## 2022-05-01 MED ORDER — OXYCODONE-ACETAMINOPHEN 10-325 MG PO TABS
1.0000 | ORAL_TABLET | ORAL | 0 refills | Status: DC | PRN
  Filled 2022-05-10: qty 180, 30d supply, fill #0

## 2022-05-01 MED ORDER — FENTANYL 50 MCG/HR TD PT72
1.0000 | MEDICATED_PATCH | TRANSDERMAL | 0 refills | Status: DC
  Filled 2022-05-10: qty 15, 30d supply, fill #0

## 2022-05-10 ENCOUNTER — Other Ambulatory Visit (HOSPITAL_BASED_OUTPATIENT_CLINIC_OR_DEPARTMENT_OTHER): Payer: Self-pay

## 2022-05-17 ENCOUNTER — Encounter: Payer: Self-pay | Admitting: Family Medicine

## 2022-05-17 ENCOUNTER — Ambulatory Visit (INDEPENDENT_AMBULATORY_CARE_PROVIDER_SITE_OTHER): Payer: Medicare Other | Admitting: Family Medicine

## 2022-05-17 VITALS — BP 128/70 | HR 57 | Temp 98.6°F | Resp 18 | Ht 65.0 in | Wt 203.2 lb

## 2022-05-17 DIAGNOSIS — R102 Pelvic and perineal pain: Secondary | ICD-10-CM | POA: Diagnosis not present

## 2022-05-17 LAB — POC URINALSYSI DIPSTICK (AUTOMATED)
Blood, UA: NEGATIVE
Glucose, UA: NEGATIVE
Ketones, UA: NEGATIVE
Leukocytes, UA: NEGATIVE
Nitrite, UA: NEGATIVE
Protein, UA: NEGATIVE
Spec Grav, UA: 1.015 (ref 1.010–1.025)
Urobilinogen, UA: 0.2 E.U./dL
pH, UA: 6 (ref 5.0–8.0)

## 2022-05-17 NOTE — Progress Notes (Addendum)
Subjective:   By signing my name below, I, [name], attest that this documentation has been prepared under the direction and in the presence of Ann Held, DO. 05/17/2022   Patient ID: Karen Brown, female    DOB: 01-18-1961, 61 y.o.   MRN: [id number]  Chief Complaint  Patient presents with   Abdominal Pain    Pain started on Tuesday morning. Woke patient out of sleep. Pt states it doesn't feel like a UTI. Pt states her bladder hurts when she uses the bathroom.    Abdominal Pain Pertinent negatives include no dysuria, fever, frequency, headaches, hematuria or vomiting.   Patient is in today for a office visit.   She complains of constant pressure and pain in her bladder for the past 2 days. Her pain is occasionally sharp. Her pain first came on at 5 am and woke her up. She denies any vaginal discharge, heartburn. Her symptoms don't remind her of her previous UTI's. Her pain worsens while her bladder is full and while urinating or having bowel movements. She has pain in her bladder while urinating. She prefers standing over sitting due to the pain. She denies any dysuria or urinary frequency. Her pain does not radiate anywhere else.    Past Medical History:  Diagnosis Date   Anxiety    Bipolar 1 disorder ([place])    Chronic migraine    Chronically dry eyes    Colitis, ischemic ([place])    Complication of anesthesia    versed does not work as stated per pt    Depression    Diverticulosis    Dry mouth    Family history of adverse reaction to anesthesia    Fibromyalgia    GERD (gastroesophageal reflux disease)    Hemorrhoids    Hyperlipemia    Hypertension    Hypothyroidism    IBS (irritable bowel syndrome)    PVC (premature ventricular contraction)     Past Surgical History:  Procedure Laterality Date   ABDOMINAL HYSTERECTOMY     Partial    APPENDECTOMY     BACK SURGERY     Micro diskectomy   BACK SURGERY     L facet rhzotomy   CHOLECYSTECTOMY      EXTRACORPOREAL SHOCK WAVE LITHOTRIPSY Left 03/19/2019   Procedure: EXTRACORPOREAL SHOCK WAVE LITHOTRIPSY (ESWL);  Surgeon: Ceasar Mons, MD;  Location: [facility];  Service: Urology;  Laterality: Left;   TONSILLECTOMY      Family History  Problem Relation Age of Onset   Kidney disease Father    Coronary artery disease Father 51   COPD Father 89       copd   Heart disease Father    Colon polyps Mother    Osteoporosis Mother    Colon cancer Neg Hx     Social History   Socioeconomic History   Marital status: Married    Spouse name: Not on file   Number of children: 0   Years of education: Not on file   Highest education level: Not on file  Occupational History   Occupation: Disabled     Employer: [employer]  Tobacco Use   Smoking status: Never   Smokeless tobacco: Never  Vaping Use   Vaping Use: Never used  Substance and Sexual Activity   Alcohol use: No   Drug use: No   Sexual activity: Yes  Other Topics Concern   Not on file  Social History Narrative   Caffeine occ  Social Determinants of Health   Financial Resource Strain: Low Risk  (01/02/2022)   Overall Financial Resource Strain (CARDIA)    Difficulty of Paying Living Expenses: Not hard at all  Food Insecurity: No Food Insecurity (01/02/2022)   Hunger Vital Sign    Worried About Running Out of Food in the Last Year: Never true    Ran Out of Food in the Last Year: Never true  Transportation Needs: No Transportation Needs (01/02/2022)   PRAPARE - Hydrologist (Medical): No    Lack of Transportation (Non-Medical): No  Physical Activity: Inactive (01/02/2022)   Exercise Vital Sign    Days of Exercise per Week: 0 days    Minutes of Exercise per Session: 0 min  Stress: No Stress Concern Present (01/02/2022)   [place]    Feeling of Stress : Only a little  Social Connections: Moderately Integrated (01/02/2022)    Social Connection and Isolation Panel [NHANES]    Frequency of Communication with Friends and Family: More than three times a week    Frequency of Social Gatherings with Friends and Family: Once a week    Attends Religious Services: 1 to 4 times per year    Active Member of [employer] or Organizations: No    Attends Archivist Meetings: Never    Marital Status: Married  Human resources officer Violence: Not At Risk (01/02/2022)   Humiliation, Afraid, Rape, and Kick questionnaire    Fear of Current or Ex-Partner: No    Emotionally Abused: No    Physically Abused: No    Sexually Abused: No    Outpatient Medications Prior to Visit  Medication Sig Dispense Refill   atenolol (TENORMIN) 25 MG tablet 1 tablet     AUVELITY 45-105 MG TBCR Take by mouth.     butalbital-acetaminophen-caffeine (FIORICET) 50-325-40 MG tablet Take 1 tablet by mouth every 4 hours as needed 20 tablet 0   Cholecalciferol 50 MCG (2000 UT) CAPS Vitamin D3 50 mcg (2,000 unit) capsule   1 capsule every day by oral route.     clonazePAM (KLONOPIN) 1 MG tablet Take 1 mg by mouth 3 (three) times daily as needed.     clotrimazole-betamethasone (LOTRISONE) cream Apply 1 Application topically 2 (two) times daily. 30 g 0   estradiol (ESTRACE) 0.1 MG/GM vaginal cream Place vaginally.     fentaNYL (DURAGESIC) 50 MCG/HR Apply 1 patch to skin every 48 hours as directed 15 patch 0   fentaNYL (DURAGESIC) 50 MCG/HR Place 1 patch onto the skin every 48 hours as directed. 15 patch 0   lamoTRIgine (LAMICTAL) 25 MG tablet Take 25 mg by mouth 2 (two) times daily. 1 in the morning and 1 in the evening     levothyroxine (SYNTHROID) 150 MCG tablet Take 1 tablet (150 mcg total) by mouth daily before breakfast. 30 tablet 2   lisinopril (PRINIVIL,ZESTRIL) 20 MG tablet Take 1 tablet by mouth daily.     OVER THE COUNTER MEDICATION Take 3 tablets by mouth daily. cartilast     oxyCODONE-acetaminophen (PERCOCET) 10-325 MG tablet Take 1 tablet by mouth  daily. Will take up to 6 tabs a day  0   oxyCODONE-acetaminophen (PERCOCET) 10-325 MG tablet Take 1 tablet by mouth every four hours as needed for pain 180 tablet 0   oxyCODONE-acetaminophen (PERCOCET) 10-325 MG tablet Take 1 tablet by mouth every 4 (four) hours as needed for pain. 180 tablet 0  oxyCODONE-acetaminophen (PERCOCET) 10-325 MG tablet Take 1 tablet by mouth every four hours as needed for pain 02/09/22 180 tablet 0   oxyCODONE-acetaminophen (PERCOCET) 10-325 MG tablet Take 1 tablet by mouth every 4 (four) hours as needed for pain 180 tablet 0   oxyCODONE-acetaminophen (PERCOCET) 10-325 MG tablet Take 1 tablet by mouth every 4 (four) hours as needed for pain. 180 tablet 0   oxyCODONE-acetaminophen (PERCOCET) 10-325 MG tablet Take 1 tablet by mouth every 4 (four) hours as needed for pain. 180 tablet 0   promethazine (PHENERGAN) 25 MG tablet Take 1 tablet (25 mg total) by mouth as needed. 90 tablet 0   tiZANidine (ZANAFLEX) 4 MG capsule Take 4 mg by mouth 3 (three) times daily as needed for muscle spasms.     tiZANidine (ZANAFLEX) 4 MG tablet Take 1 tablet (4 mg) by mouth 3 (three) times daily as needed 270 tablet 1   No facility-administered medications prior to visit.    Allergies  Allergen Reactions   Other Other (See Comments)    Oral steroids; skin sensitivity   Biaxin [Clarithromycin] Other (See Comments)    Bad taste and ringing in ear with dec hearing   Clindamycin/Lincomycin Dermatitis   Ketorolac Tromethamine    Pb-Hyoscy-Atropine-Scopolamine    Penicillins     REACTION: HIVES   Phenobarbital    Sulfonamide Derivatives    Topiramate    Zaleplon     Review of Systems  Constitutional:  Negative for fever and malaise/fatigue.  HENT:  Negative for congestion.   Eyes:  Negative for blurred vision.  Respiratory:  Negative for cough and shortness of breath.   Cardiovascular:  Negative for chest pain, palpitations and leg swelling.  Gastrointestinal:  Negative for  heartburn and vomiting.  Genitourinary:  Negative for dysuria, flank pain, frequency, hematuria and urgency.       (+)bladder pain  Musculoskeletal:  Negative for back pain.  Skin:  Negative for rash.  Neurological:  Negative for loss of consciousness and headaches.       Objective:    Physical Exam Vitals and nursing note reviewed.  Constitutional:      General: She is not in acute distress.    Appearance: Normal appearance. She is well-developed. She is not ill-appearing.  HENT:     Head: Normocephalic and atraumatic.     Right Ear: External ear normal.     Left Ear: External ear normal.  Eyes:     Extraocular Movements: Extraocular movements intact.     Conjunctiva/sclera: Conjunctivae normal.     Pupils: Pupils are equal, round, and reactive to light.  Neck:     Thyroid: No thyromegaly.     Vascular: No carotid bruit or JVD.  Cardiovascular:     Rate and Rhythm: Normal rate and regular rhythm.     Heart sounds: Normal heart sounds. No murmur heard.    No gallop.  Pulmonary:     Effort: Pulmonary effort is normal. No respiratory distress.     Breath sounds: Normal breath sounds. No wheezing or rales.  Chest:     Chest wall: No tenderness.  Abdominal:     General: Abdomen is flat.     Palpations: Abdomen is soft.     Tenderness: There is abdominal tenderness in the suprapubic area.  Musculoskeletal:     Cervical back: Normal range of motion and neck supple.  Skin:    General: Skin is warm and dry.  Neurological:     Mental Status: She  is alert and oriented to person, place, and time.  Psychiatric:        Judgment: Judgment normal.     BP 128/70 (BP Location: Left Arm, Patient Position: Sitting, Cuff Size: Large)   Pulse (!) 57   Temp 98.6 F (37 C) (Oral)   Resp 18   Ht '5\' 5"'$  (1.651 m)   Wt 203 lb 3.2 oz (92.2 kg)   SpO2 98%   BMI 33.81 kg/m  Wt Readings from Last 3 Encounters:  05/17/22 203 lb 3.2 oz (92.2 kg)  11/02/21 208 lb 6.4 oz (94.5 kg)   05/30/21 219 lb 6.4 oz (99.5 kg)       Assessment & Plan:  Suprapubic pain -     POCT Urinalysis Dipstick (Automated) -     US PELVIC COMPLETE WITH TRANSVAGINAL; Future -     CBC with Differential/Platelet -     Comprehensive metabolic panel -     Urine Culture  Ua neg---- culture pending  Check US pelvic  I, Ann Held, DO, personally preformed the services described in this documentation.  All medical record entries made by the scribe were at my direction and in my presence.  I have reviewed the chart and discharge instructions (if applicable) and agree that the record reflects my personal performance and is accurate and complete. 05/17/2022   I,[name],acting as a scribe for Ann Held, DO.,have documented all relevant documentation on the behalf of Ann Held, DO,as directed by  Ann Held, DO while in the presence of Ann Held, DO.   Ann Held, DO

## 2022-05-18 ENCOUNTER — Ambulatory Visit (HOSPITAL_BASED_OUTPATIENT_CLINIC_OR_DEPARTMENT_OTHER)
Admission: RE | Admit: 2022-05-18 | Discharge: 2022-05-18 | Disposition: A | Discharge status: Home / Self Care | Payer: Medicare Other | Source: Ambulatory Visit | Attending: Family Medicine | Admitting: Family Medicine

## 2022-05-18 DIAGNOSIS — R102 Pelvic and perineal pain: Secondary | ICD-10-CM

## 2022-05-18 LAB — CBC WITH DIFFERENTIAL/PLATELET
Basophils Absolute: 0.1 10*3/uL (ref 0.0–0.1)
Basophils Relative: 1.2 % (ref 0.0–3.0)
Eosinophils Absolute: 0.3 10*3/uL (ref 0.0–0.7)
Eosinophils Relative: 3.3 % (ref 0.0–5.0)
HCT: 41.9 % (ref 36.0–46.0)
Hemoglobin: 13.9 g/dL (ref 12.0–15.0)
Lymphocytes Relative: 26 % (ref 12.0–46.0)
Lymphs Abs: 2.1 10*3/uL (ref 0.7–4.0)
MCHC: 33.2 g/dL (ref 30.0–36.0)
MCV: 88 fl (ref 78.0–100.0)
Monocytes Absolute: 0.5 10*3/uL (ref 0.1–1.0)
Monocytes Relative: 5.6 % (ref 3.0–12.0)
Neutro Abs: 5.3 10*3/uL (ref 1.4–7.7)
Neutrophils Relative %: 63.9 % (ref 43.0–77.0)
Platelets: 289 10*3/uL (ref 150.0–400.0)
RBC: 4.76 Mil/uL (ref 3.87–5.11)
RDW: 13.1 % (ref 11.5–15.5)
WBC: 8.3 10*3/uL (ref 4.0–10.5)

## 2022-05-18 LAB — COMPREHENSIVE METABOLIC PANEL WITH GFR
ALT: 89 U/L — ABNORMAL HIGH (ref 0–35)
AST: 86 U/L — ABNORMAL HIGH (ref 0–37)
Albumin: 4.2 g/dL (ref 3.5–5.2)
Alkaline Phosphatase: 197 U/L — ABNORMAL HIGH (ref 39–117)
BUN: 16 mg/dL (ref 6–23)
CO2: 31 meq/L (ref 19–32)
Calcium: 9.3 mg/dL (ref 8.4–10.5)
Chloride: 100 meq/L (ref 96–112)
Creatinine, Ser: 1.01 mg/dL (ref 0.40–1.20)
GFR: 59.38 mL/min — ABNORMAL LOW
Glucose, Bld: 89 mg/dL (ref 70–99)
Potassium: 5 meq/L (ref 3.5–5.1)
Sodium: 138 meq/L (ref 135–145)
Total Bilirubin: 0.4 mg/dL (ref 0.2–1.2)
Total Protein: 6.9 g/dL (ref 6.0–8.3)

## 2022-05-18 LAB — URINE CULTURE
MICRO NUMBER:: 14284358
SPECIMEN QUALITY:: ADEQUATE

## 2022-05-21 ENCOUNTER — Ambulatory Visit (HOSPITAL_BASED_OUTPATIENT_CLINIC_OR_DEPARTMENT_OTHER): Payer: Medicare Other

## 2022-05-23 ENCOUNTER — Encounter: Payer: Self-pay | Admitting: Family Medicine

## 2022-05-24 ENCOUNTER — Other Ambulatory Visit: Payer: Self-pay | Admitting: Family Medicine

## 2022-05-24 DIAGNOSIS — R7989 Other specified abnormal findings of blood chemistry: Secondary | ICD-10-CM

## 2022-05-25 DIAGNOSIS — M47816 Spondylosis without myelopathy or radiculopathy, lumbar region: Secondary | ICD-10-CM | POA: Diagnosis not present

## 2022-05-29 ENCOUNTER — Other Ambulatory Visit (HOSPITAL_BASED_OUTPATIENT_CLINIC_OR_DEPARTMENT_OTHER): Payer: Self-pay

## 2022-05-29 DIAGNOSIS — S32009S Unspecified fracture of unspecified lumbar vertebra, sequela: Secondary | ICD-10-CM | POA: Diagnosis not present

## 2022-05-29 DIAGNOSIS — M4726 Other spondylosis with radiculopathy, lumbar region: Secondary | ICD-10-CM | POA: Diagnosis not present

## 2022-05-29 DIAGNOSIS — Z79891 Long term (current) use of opiate analgesic: Secondary | ICD-10-CM | POA: Diagnosis not present

## 2022-05-29 DIAGNOSIS — G894 Chronic pain syndrome: Secondary | ICD-10-CM | POA: Diagnosis not present

## 2022-05-29 MED ORDER — FENTANYL 50 MCG/HR TD PT72
1.0000 | MEDICATED_PATCH | TRANSDERMAL | 0 refills | Status: DC
  Filled 2022-06-08: qty 15, 30d supply, fill #0

## 2022-05-29 MED ORDER — OXYCODONE-ACETAMINOPHEN 10-325 MG PO TABS
1.0000 | ORAL_TABLET | ORAL | 0 refills | Status: DC | PRN
  Filled 2022-06-08: qty 180, 30d supply, fill #0

## 2022-06-06 DIAGNOSIS — M6281 Muscle weakness (generalized): Secondary | ICD-10-CM | POA: Diagnosis not present

## 2022-06-06 DIAGNOSIS — M546 Pain in thoracic spine: Secondary | ICD-10-CM | POA: Diagnosis not present

## 2022-06-06 DIAGNOSIS — M47816 Spondylosis without myelopathy or radiculopathy, lumbar region: Secondary | ICD-10-CM | POA: Diagnosis not present

## 2022-06-06 DIAGNOSIS — R260 Ataxic gait: Secondary | ICD-10-CM | POA: Diagnosis not present

## 2022-06-08 ENCOUNTER — Other Ambulatory Visit (HOSPITAL_BASED_OUTPATIENT_CLINIC_OR_DEPARTMENT_OTHER): Payer: Self-pay

## 2022-06-08 ENCOUNTER — Other Ambulatory Visit: Payer: Self-pay

## 2022-06-20 DIAGNOSIS — R260 Ataxic gait: Secondary | ICD-10-CM | POA: Diagnosis not present

## 2022-06-20 DIAGNOSIS — M6281 Muscle weakness (generalized): Secondary | ICD-10-CM | POA: Diagnosis not present

## 2022-06-20 DIAGNOSIS — M47816 Spondylosis without myelopathy or radiculopathy, lumbar region: Secondary | ICD-10-CM | POA: Diagnosis not present

## 2022-06-20 DIAGNOSIS — M546 Pain in thoracic spine: Secondary | ICD-10-CM | POA: Diagnosis not present

## 2022-06-22 DIAGNOSIS — M6281 Muscle weakness (generalized): Secondary | ICD-10-CM | POA: Diagnosis not present

## 2022-06-22 DIAGNOSIS — M47816 Spondylosis without myelopathy or radiculopathy, lumbar region: Secondary | ICD-10-CM | POA: Diagnosis not present

## 2022-06-22 DIAGNOSIS — M546 Pain in thoracic spine: Secondary | ICD-10-CM | POA: Diagnosis not present

## 2022-06-22 DIAGNOSIS — R260 Ataxic gait: Secondary | ICD-10-CM | POA: Diagnosis not present

## 2022-06-25 ENCOUNTER — Other Ambulatory Visit (HOSPITAL_BASED_OUTPATIENT_CLINIC_OR_DEPARTMENT_OTHER): Payer: Self-pay

## 2022-06-25 MED ORDER — TIZANIDINE HCL 4 MG PO TABS
4.0000 mg | ORAL_TABLET | Freq: Three times a day (TID) | ORAL | 0 refills | Status: DC
  Filled 2022-06-25: qty 270, 90d supply, fill #0

## 2022-06-26 DIAGNOSIS — R260 Ataxic gait: Secondary | ICD-10-CM | POA: Diagnosis not present

## 2022-06-26 DIAGNOSIS — M546 Pain in thoracic spine: Secondary | ICD-10-CM | POA: Diagnosis not present

## 2022-06-26 DIAGNOSIS — M47816 Spondylosis without myelopathy or radiculopathy, lumbar region: Secondary | ICD-10-CM | POA: Diagnosis not present

## 2022-06-26 DIAGNOSIS — M6281 Muscle weakness (generalized): Secondary | ICD-10-CM | POA: Diagnosis not present

## 2022-06-28 ENCOUNTER — Other Ambulatory Visit (HOSPITAL_BASED_OUTPATIENT_CLINIC_OR_DEPARTMENT_OTHER): Payer: Self-pay

## 2022-06-28 DIAGNOSIS — Z79891 Long term (current) use of opiate analgesic: Secondary | ICD-10-CM | POA: Diagnosis not present

## 2022-06-28 DIAGNOSIS — R7301 Impaired fasting glucose: Secondary | ICD-10-CM | POA: Diagnosis not present

## 2022-06-28 DIAGNOSIS — G894 Chronic pain syndrome: Secondary | ICD-10-CM | POA: Diagnosis not present

## 2022-06-28 DIAGNOSIS — N200 Calculus of kidney: Secondary | ICD-10-CM | POA: Diagnosis not present

## 2022-06-28 DIAGNOSIS — E039 Hypothyroidism, unspecified: Secondary | ICD-10-CM | POA: Diagnosis not present

## 2022-06-28 DIAGNOSIS — M4726 Other spondylosis with radiculopathy, lumbar region: Secondary | ICD-10-CM | POA: Diagnosis not present

## 2022-06-28 DIAGNOSIS — E7800 Pure hypercholesterolemia, unspecified: Secondary | ICD-10-CM | POA: Diagnosis not present

## 2022-06-28 DIAGNOSIS — S32009S Unspecified fracture of unspecified lumbar vertebra, sequela: Secondary | ICD-10-CM | POA: Diagnosis not present

## 2022-06-28 DIAGNOSIS — E559 Vitamin D deficiency, unspecified: Secondary | ICD-10-CM | POA: Diagnosis not present

## 2022-06-28 DIAGNOSIS — I10 Essential (primary) hypertension: Secondary | ICD-10-CM | POA: Diagnosis not present

## 2022-06-28 MED ORDER — OXYCODONE-ACETAMINOPHEN 10-325 MG PO TABS
1.0000 | ORAL_TABLET | ORAL | 0 refills | Status: DC | PRN
  Filled 2022-07-06: qty 180, 30d supply, fill #0

## 2022-06-28 MED ORDER — FENTANYL 50 MCG/HR TD PT72
1.0000 | MEDICATED_PATCH | TRANSDERMAL | 0 refills | Status: DC
  Filled 2022-07-06: qty 15, 30d supply, fill #0

## 2022-06-29 ENCOUNTER — Other Ambulatory Visit (HOSPITAL_BASED_OUTPATIENT_CLINIC_OR_DEPARTMENT_OTHER): Payer: Self-pay

## 2022-06-29 DIAGNOSIS — M546 Pain in thoracic spine: Secondary | ICD-10-CM | POA: Diagnosis not present

## 2022-06-29 DIAGNOSIS — M6281 Muscle weakness (generalized): Secondary | ICD-10-CM | POA: Diagnosis not present

## 2022-06-29 DIAGNOSIS — R260 Ataxic gait: Secondary | ICD-10-CM | POA: Diagnosis not present

## 2022-06-29 DIAGNOSIS — M47816 Spondylosis without myelopathy or radiculopathy, lumbar region: Secondary | ICD-10-CM | POA: Diagnosis not present

## 2022-06-29 LAB — LAB REPORT - SCANNED
A1c: 5.6
EGFR: 71

## 2022-06-29 MED ORDER — LEVOTHYROXINE SODIUM 175 MCG PO TABS
175.0000 ug | ORAL_TABLET | Freq: Every morning | ORAL | 0 refills | Status: DC
  Filled 2022-06-29: qty 30, 30d supply, fill #0

## 2022-07-03 DIAGNOSIS — M47816 Spondylosis without myelopathy or radiculopathy, lumbar region: Secondary | ICD-10-CM | POA: Diagnosis not present

## 2022-07-03 DIAGNOSIS — M546 Pain in thoracic spine: Secondary | ICD-10-CM | POA: Diagnosis not present

## 2022-07-03 DIAGNOSIS — M6281 Muscle weakness (generalized): Secondary | ICD-10-CM | POA: Diagnosis not present

## 2022-07-03 DIAGNOSIS — R260 Ataxic gait: Secondary | ICD-10-CM | POA: Diagnosis not present

## 2022-07-06 ENCOUNTER — Other Ambulatory Visit (HOSPITAL_BASED_OUTPATIENT_CLINIC_OR_DEPARTMENT_OTHER): Payer: Self-pay

## 2022-07-06 DIAGNOSIS — R260 Ataxic gait: Secondary | ICD-10-CM | POA: Diagnosis not present

## 2022-07-06 DIAGNOSIS — M47816 Spondylosis without myelopathy or radiculopathy, lumbar region: Secondary | ICD-10-CM | POA: Diagnosis not present

## 2022-07-06 DIAGNOSIS — M546 Pain in thoracic spine: Secondary | ICD-10-CM | POA: Diagnosis not present

## 2022-07-06 DIAGNOSIS — M6281 Muscle weakness (generalized): Secondary | ICD-10-CM | POA: Diagnosis not present

## 2022-07-10 DIAGNOSIS — M6281 Muscle weakness (generalized): Secondary | ICD-10-CM | POA: Diagnosis not present

## 2022-07-10 DIAGNOSIS — R260 Ataxic gait: Secondary | ICD-10-CM | POA: Diagnosis not present

## 2022-07-10 DIAGNOSIS — M47816 Spondylosis without myelopathy or radiculopathy, lumbar region: Secondary | ICD-10-CM | POA: Diagnosis not present

## 2022-07-10 DIAGNOSIS — M546 Pain in thoracic spine: Secondary | ICD-10-CM | POA: Diagnosis not present

## 2022-07-13 DIAGNOSIS — M47816 Spondylosis without myelopathy or radiculopathy, lumbar region: Secondary | ICD-10-CM | POA: Diagnosis not present

## 2022-07-13 DIAGNOSIS — M546 Pain in thoracic spine: Secondary | ICD-10-CM | POA: Diagnosis not present

## 2022-07-13 DIAGNOSIS — M6281 Muscle weakness (generalized): Secondary | ICD-10-CM | POA: Diagnosis not present

## 2022-07-13 DIAGNOSIS — R260 Ataxic gait: Secondary | ICD-10-CM | POA: Diagnosis not present

## 2022-07-20 DIAGNOSIS — R260 Ataxic gait: Secondary | ICD-10-CM | POA: Diagnosis not present

## 2022-07-20 DIAGNOSIS — M47816 Spondylosis without myelopathy or radiculopathy, lumbar region: Secondary | ICD-10-CM | POA: Diagnosis not present

## 2022-07-20 DIAGNOSIS — M546 Pain in thoracic spine: Secondary | ICD-10-CM | POA: Diagnosis not present

## 2022-07-20 DIAGNOSIS — M6281 Muscle weakness (generalized): Secondary | ICD-10-CM | POA: Diagnosis not present

## 2022-07-23 ENCOUNTER — Other Ambulatory Visit (HOSPITAL_BASED_OUTPATIENT_CLINIC_OR_DEPARTMENT_OTHER): Payer: Self-pay

## 2022-07-23 MED ORDER — LEVOTHYROXINE SODIUM 175 MCG PO TABS
175.0000 ug | ORAL_TABLET | Freq: Every morning | ORAL | 11 refills | Status: DC
  Filled 2022-07-23: qty 30, 30d supply, fill #0
  Filled 2022-08-24: qty 30, 30d supply, fill #1
  Filled 2022-09-21: qty 30, 30d supply, fill #2
  Filled 2022-10-22: qty 30, 30d supply, fill #3

## 2022-07-24 DIAGNOSIS — M546 Pain in thoracic spine: Secondary | ICD-10-CM | POA: Diagnosis not present

## 2022-07-24 DIAGNOSIS — M47816 Spondylosis without myelopathy or radiculopathy, lumbar region: Secondary | ICD-10-CM | POA: Diagnosis not present

## 2022-07-24 DIAGNOSIS — M6281 Muscle weakness (generalized): Secondary | ICD-10-CM | POA: Diagnosis not present

## 2022-07-24 DIAGNOSIS — R260 Ataxic gait: Secondary | ICD-10-CM | POA: Diagnosis not present

## 2022-07-26 ENCOUNTER — Other Ambulatory Visit (HOSPITAL_BASED_OUTPATIENT_CLINIC_OR_DEPARTMENT_OTHER): Payer: Self-pay

## 2022-07-26 DIAGNOSIS — Z79891 Long term (current) use of opiate analgesic: Secondary | ICD-10-CM | POA: Diagnosis not present

## 2022-07-26 DIAGNOSIS — G894 Chronic pain syndrome: Secondary | ICD-10-CM | POA: Diagnosis not present

## 2022-07-26 DIAGNOSIS — M4726 Other spondylosis with radiculopathy, lumbar region: Secondary | ICD-10-CM | POA: Diagnosis not present

## 2022-07-26 DIAGNOSIS — S32009S Unspecified fracture of unspecified lumbar vertebra, sequela: Secondary | ICD-10-CM | POA: Diagnosis not present

## 2022-07-26 MED ORDER — OXYCODONE-ACETAMINOPHEN 10-325 MG PO TABS
1.0000 | ORAL_TABLET | ORAL | 0 refills | Status: DC | PRN
  Filled 2022-08-06: qty 180, 30d supply, fill #0

## 2022-07-26 MED ORDER — FENTANYL 50 MCG/HR TD PT72
1.0000 | MEDICATED_PATCH | TRANSDERMAL | 0 refills | Status: DC
  Filled 2022-08-06: qty 15, 30d supply, fill #0

## 2022-07-27 DIAGNOSIS — M47816 Spondylosis without myelopathy or radiculopathy, lumbar region: Secondary | ICD-10-CM | POA: Diagnosis not present

## 2022-07-27 DIAGNOSIS — M6281 Muscle weakness (generalized): Secondary | ICD-10-CM | POA: Diagnosis not present

## 2022-07-27 DIAGNOSIS — R260 Ataxic gait: Secondary | ICD-10-CM | POA: Diagnosis not present

## 2022-07-27 DIAGNOSIS — M546 Pain in thoracic spine: Secondary | ICD-10-CM | POA: Diagnosis not present

## 2022-07-30 DIAGNOSIS — S32010S Wedge compression fracture of first lumbar vertebra, sequela: Secondary | ICD-10-CM | POA: Diagnosis not present

## 2022-07-30 DIAGNOSIS — M47816 Spondylosis without myelopathy or radiculopathy, lumbar region: Secondary | ICD-10-CM | POA: Diagnosis not present

## 2022-07-31 DIAGNOSIS — M6281 Muscle weakness (generalized): Secondary | ICD-10-CM | POA: Diagnosis not present

## 2022-07-31 DIAGNOSIS — M546 Pain in thoracic spine: Secondary | ICD-10-CM | POA: Diagnosis not present

## 2022-07-31 DIAGNOSIS — R260 Ataxic gait: Secondary | ICD-10-CM | POA: Diagnosis not present

## 2022-07-31 DIAGNOSIS — M47816 Spondylosis without myelopathy or radiculopathy, lumbar region: Secondary | ICD-10-CM | POA: Diagnosis not present

## 2022-08-06 ENCOUNTER — Other Ambulatory Visit (HOSPITAL_BASED_OUTPATIENT_CLINIC_OR_DEPARTMENT_OTHER): Payer: Self-pay

## 2022-08-07 DIAGNOSIS — M546 Pain in thoracic spine: Secondary | ICD-10-CM | POA: Diagnosis not present

## 2022-08-07 DIAGNOSIS — M47816 Spondylosis without myelopathy or radiculopathy, lumbar region: Secondary | ICD-10-CM | POA: Diagnosis not present

## 2022-08-07 DIAGNOSIS — R260 Ataxic gait: Secondary | ICD-10-CM | POA: Diagnosis not present

## 2022-08-07 DIAGNOSIS — M6281 Muscle weakness (generalized): Secondary | ICD-10-CM | POA: Diagnosis not present

## 2022-08-10 DIAGNOSIS — R260 Ataxic gait: Secondary | ICD-10-CM | POA: Diagnosis not present

## 2022-08-10 DIAGNOSIS — M47816 Spondylosis without myelopathy or radiculopathy, lumbar region: Secondary | ICD-10-CM | POA: Diagnosis not present

## 2022-08-10 DIAGNOSIS — M6281 Muscle weakness (generalized): Secondary | ICD-10-CM | POA: Diagnosis not present

## 2022-08-10 DIAGNOSIS — M546 Pain in thoracic spine: Secondary | ICD-10-CM | POA: Diagnosis not present

## 2022-08-14 DIAGNOSIS — M6281 Muscle weakness (generalized): Secondary | ICD-10-CM | POA: Diagnosis not present

## 2022-08-14 DIAGNOSIS — M47816 Spondylosis without myelopathy or radiculopathy, lumbar region: Secondary | ICD-10-CM | POA: Diagnosis not present

## 2022-08-14 DIAGNOSIS — M546 Pain in thoracic spine: Secondary | ICD-10-CM | POA: Diagnosis not present

## 2022-08-14 DIAGNOSIS — R260 Ataxic gait: Secondary | ICD-10-CM | POA: Diagnosis not present

## 2022-08-17 DIAGNOSIS — M47816 Spondylosis without myelopathy or radiculopathy, lumbar region: Secondary | ICD-10-CM | POA: Diagnosis not present

## 2022-08-17 DIAGNOSIS — R260 Ataxic gait: Secondary | ICD-10-CM | POA: Diagnosis not present

## 2022-08-17 DIAGNOSIS — M6281 Muscle weakness (generalized): Secondary | ICD-10-CM | POA: Diagnosis not present

## 2022-08-17 DIAGNOSIS — M546 Pain in thoracic spine: Secondary | ICD-10-CM | POA: Diagnosis not present

## 2022-08-21 ENCOUNTER — Other Ambulatory Visit: Payer: Self-pay | Admitting: Family Medicine

## 2022-08-21 DIAGNOSIS — M47816 Spondylosis without myelopathy or radiculopathy, lumbar region: Secondary | ICD-10-CM | POA: Diagnosis not present

## 2022-08-21 DIAGNOSIS — M546 Pain in thoracic spine: Secondary | ICD-10-CM | POA: Diagnosis not present

## 2022-08-21 DIAGNOSIS — Z1231 Encounter for screening mammogram for malignant neoplasm of breast: Secondary | ICD-10-CM

## 2022-08-21 DIAGNOSIS — R260 Ataxic gait: Secondary | ICD-10-CM | POA: Diagnosis not present

## 2022-08-21 DIAGNOSIS — M6281 Muscle weakness (generalized): Secondary | ICD-10-CM | POA: Diagnosis not present

## 2022-08-23 ENCOUNTER — Other Ambulatory Visit (HOSPITAL_BASED_OUTPATIENT_CLINIC_OR_DEPARTMENT_OTHER): Payer: Self-pay

## 2022-08-23 DIAGNOSIS — G43101 Migraine with aura, not intractable, with status migrainosus: Secondary | ICD-10-CM | POA: Diagnosis not present

## 2022-08-23 DIAGNOSIS — M4726 Other spondylosis with radiculopathy, lumbar region: Secondary | ICD-10-CM | POA: Diagnosis not present

## 2022-08-23 DIAGNOSIS — Z79891 Long term (current) use of opiate analgesic: Secondary | ICD-10-CM | POA: Diagnosis not present

## 2022-08-23 DIAGNOSIS — S32009S Unspecified fracture of unspecified lumbar vertebra, sequela: Secondary | ICD-10-CM | POA: Diagnosis not present

## 2022-08-23 DIAGNOSIS — M150 Primary generalized (osteo)arthritis: Secondary | ICD-10-CM | POA: Diagnosis not present

## 2022-08-23 DIAGNOSIS — M62838 Other muscle spasm: Secondary | ICD-10-CM | POA: Diagnosis not present

## 2022-08-23 DIAGNOSIS — G894 Chronic pain syndrome: Secondary | ICD-10-CM | POA: Diagnosis not present

## 2022-08-23 DIAGNOSIS — E039 Hypothyroidism, unspecified: Secondary | ICD-10-CM | POA: Diagnosis not present

## 2022-08-23 DIAGNOSIS — M170 Bilateral primary osteoarthritis of knee: Secondary | ICD-10-CM | POA: Diagnosis not present

## 2022-08-23 DIAGNOSIS — M4124 Other idiopathic scoliosis, thoracic region: Secondary | ICD-10-CM | POA: Diagnosis not present

## 2022-08-23 DIAGNOSIS — M47814 Spondylosis without myelopathy or radiculopathy, thoracic region: Secondary | ICD-10-CM | POA: Diagnosis not present

## 2022-08-23 MED ORDER — BUTALBITAL-APAP-CAFFEINE 50-325-40 MG PO TABS
1.0000 | ORAL_TABLET | Freq: Three times a day (TID) | ORAL | 2 refills | Status: DC | PRN
  Filled 2022-08-23: qty 30, 10d supply, fill #0
  Filled 2022-12-11: qty 30, 10d supply, fill #1
  Filled 2023-02-07: qty 30, 10d supply, fill #2

## 2022-08-23 MED ORDER — FENTANYL 50 MCG/HR TD PT72
1.0000 | MEDICATED_PATCH | TRANSDERMAL | 0 refills | Status: DC
  Filled 2022-09-05: qty 15, 30d supply, fill #0

## 2022-08-23 MED ORDER — OXYCODONE-ACETAMINOPHEN 10-325 MG PO TABS
1.0000 | ORAL_TABLET | ORAL | 0 refills | Status: DC | PRN
  Filled 2022-09-05: qty 180, 30d supply, fill #0

## 2022-08-28 DIAGNOSIS — R260 Ataxic gait: Secondary | ICD-10-CM | POA: Diagnosis not present

## 2022-08-28 DIAGNOSIS — M47816 Spondylosis without myelopathy or radiculopathy, lumbar region: Secondary | ICD-10-CM | POA: Diagnosis not present

## 2022-08-28 DIAGNOSIS — M6281 Muscle weakness (generalized): Secondary | ICD-10-CM | POA: Diagnosis not present

## 2022-08-28 DIAGNOSIS — M546 Pain in thoracic spine: Secondary | ICD-10-CM | POA: Diagnosis not present

## 2022-08-30 DIAGNOSIS — R260 Ataxic gait: Secondary | ICD-10-CM | POA: Diagnosis not present

## 2022-08-30 DIAGNOSIS — M6281 Muscle weakness (generalized): Secondary | ICD-10-CM | POA: Diagnosis not present

## 2022-08-30 DIAGNOSIS — M546 Pain in thoracic spine: Secondary | ICD-10-CM | POA: Diagnosis not present

## 2022-08-30 DIAGNOSIS — M47816 Spondylosis without myelopathy or radiculopathy, lumbar region: Secondary | ICD-10-CM | POA: Diagnosis not present

## 2022-09-03 ENCOUNTER — Other Ambulatory Visit (HOSPITAL_BASED_OUTPATIENT_CLINIC_OR_DEPARTMENT_OTHER): Payer: Self-pay

## 2022-09-04 DIAGNOSIS — M6281 Muscle weakness (generalized): Secondary | ICD-10-CM | POA: Diagnosis not present

## 2022-09-04 DIAGNOSIS — M47816 Spondylosis without myelopathy or radiculopathy, lumbar region: Secondary | ICD-10-CM | POA: Diagnosis not present

## 2022-09-04 DIAGNOSIS — M546 Pain in thoracic spine: Secondary | ICD-10-CM | POA: Diagnosis not present

## 2022-09-04 DIAGNOSIS — R260 Ataxic gait: Secondary | ICD-10-CM | POA: Diagnosis not present

## 2022-09-05 ENCOUNTER — Other Ambulatory Visit (HOSPITAL_BASED_OUTPATIENT_CLINIC_OR_DEPARTMENT_OTHER): Payer: Self-pay

## 2022-09-20 ENCOUNTER — Other Ambulatory Visit (HOSPITAL_BASED_OUTPATIENT_CLINIC_OR_DEPARTMENT_OTHER): Payer: Self-pay

## 2022-09-20 DIAGNOSIS — S32009S Unspecified fracture of unspecified lumbar vertebra, sequela: Secondary | ICD-10-CM | POA: Diagnosis not present

## 2022-09-20 DIAGNOSIS — Z79891 Long term (current) use of opiate analgesic: Secondary | ICD-10-CM | POA: Diagnosis not present

## 2022-09-20 DIAGNOSIS — M4726 Other spondylosis with radiculopathy, lumbar region: Secondary | ICD-10-CM | POA: Diagnosis not present

## 2022-09-20 DIAGNOSIS — G894 Chronic pain syndrome: Secondary | ICD-10-CM | POA: Diagnosis not present

## 2022-09-20 MED ORDER — TIZANIDINE HCL 4 MG PO TABS
4.0000 mg | ORAL_TABLET | Freq: Three times a day (TID) | ORAL | 0 refills | Status: DC | PRN
  Filled 2022-09-20: qty 270, 90d supply, fill #0

## 2022-09-20 MED ORDER — OXYCODONE-ACETAMINOPHEN 10-325 MG PO TABS
1.0000 | ORAL_TABLET | ORAL | 0 refills | Status: DC | PRN
  Filled 2022-10-05: qty 180, 30d supply, fill #0

## 2022-09-20 MED ORDER — FENTANYL 50 MCG/HR TD PT72
1.0000 | MEDICATED_PATCH | TRANSDERMAL | 0 refills | Status: DC
  Filled 2022-10-05: qty 15, 30d supply, fill #0

## 2022-09-25 DIAGNOSIS — M546 Pain in thoracic spine: Secondary | ICD-10-CM | POA: Diagnosis not present

## 2022-09-25 DIAGNOSIS — R260 Ataxic gait: Secondary | ICD-10-CM | POA: Diagnosis not present

## 2022-09-25 DIAGNOSIS — M6281 Muscle weakness (generalized): Secondary | ICD-10-CM | POA: Diagnosis not present

## 2022-09-25 DIAGNOSIS — M47816 Spondylosis without myelopathy or radiculopathy, lumbar region: Secondary | ICD-10-CM | POA: Diagnosis not present

## 2022-09-27 ENCOUNTER — Other Ambulatory Visit (HOSPITAL_BASED_OUTPATIENT_CLINIC_OR_DEPARTMENT_OTHER): Payer: Self-pay

## 2022-09-27 ENCOUNTER — Encounter: Payer: Self-pay | Admitting: Family Medicine

## 2022-09-27 ENCOUNTER — Ambulatory Visit (INDEPENDENT_AMBULATORY_CARE_PROVIDER_SITE_OTHER): Payer: Medicare Other | Admitting: Family Medicine

## 2022-09-27 VITALS — BP 128/76 | HR 75 | Ht 65.0 in | Wt 210.8 lb

## 2022-09-27 DIAGNOSIS — R197 Diarrhea, unspecified: Secondary | ICD-10-CM | POA: Insufficient documentation

## 2022-09-27 DIAGNOSIS — R300 Dysuria: Secondary | ICD-10-CM

## 2022-09-27 DIAGNOSIS — R399 Unspecified symptoms and signs involving the genitourinary system: Secondary | ICD-10-CM | POA: Diagnosis not present

## 2022-09-27 LAB — POC URINALSYSI DIPSTICK (AUTOMATED)
Bilirubin, UA: NEGATIVE
Glucose, UA: NEGATIVE
Ketones, UA: NEGATIVE
Nitrite, UA: NEGATIVE
Protein, UA: NEGATIVE
Spec Grav, UA: <=1.005 — AB (ref 1.010–1.025)
Urobilinogen, UA: 0.2 E.U./dL
pH, UA: 6 (ref 5.0–8.0)

## 2022-09-27 MED ORDER — NITROFURANTOIN MONOHYD MACRO 100 MG PO CAPS
100.0000 mg | ORAL_CAPSULE | Freq: Two times a day (BID) | ORAL | 0 refills | Status: DC
  Filled 2022-09-27: qty 14, 7d supply, fill #0

## 2022-09-27 NOTE — Assessment & Plan Note (Signed)
Culture pending Macrobid sent in

## 2022-09-27 NOTE — Assessment & Plan Note (Signed)
Check labs and stool culture

## 2022-09-27 NOTE — Progress Notes (Signed)
Subjective:   By signing my name below, I, [name], attest that this documentation has been prepared under the direction and in the presence of Donato Schultz, DO. 09/27/2022   Patient ID: Karen Brown, female    DOB: 1960-12-31, 62 y.o.   MRN: [id number]  Chief Complaint  Patient presents with   irregular bowel    Possible UTI cloudy urine frequency  Onset a couple of months  Diarrhea dark green      HPI Patient is in today for a office visit.   She complains of diarrhea for the past week. She has taken imodium reports having semi-formed, dark green bowel movements.  She also complains of cloudy urine, frequency.   Past Medical History:  Diagnosis Date   Anxiety    Bipolar 1 disorder    Chronic migraine    Chronically dry eyes    Colitis, ischemic    Complication of anesthesia    versed does not work as stated per pt    Depression    Diverticulosis    Dry mouth    Family history of adverse reaction to anesthesia    Fibromyalgia    GERD (gastroesophageal reflux disease)    Hemorrhoids    Hyperlipemia    Hypertension    Hypothyroidism    IBS (irritable bowel syndrome)    PVC (premature ventricular contraction)     Past Surgical History:  Procedure Laterality Date   ABDOMINAL HYSTERECTOMY     Partial    APPENDECTOMY     BACK SURGERY     Micro diskectomy   BACK SURGERY     L facet rhzotomy   CHOLECYSTECTOMY     EXTRACORPOREAL SHOCK WAVE LITHOTRIPSY Left 03/19/2019   Procedure: EXTRACORPOREAL SHOCK WAVE LITHOTRIPSY (ESWL);  Surgeon: Rene Paci, MD;  Location: [facility];  Service: Urology;  Laterality: Left;   TONSILLECTOMY      Family History  Problem Relation Age of Onset   Kidney disease Father    Coronary artery disease Father 24   COPD Father 2       copd   Heart disease Father    Colon polyps Mother    Osteoporosis Mother    Colon cancer Neg Hx     Social History   Socioeconomic History   Marital status: Married     Spouse name: Not on file   Number of children: 0   Years of education: Not on file   Highest education level: Not on file  Occupational History   Occupation: Disabled     Employer: [employer]  Tobacco Use   Smoking status: Never   Smokeless tobacco: Never  Vaping Use   Vaping Use: Never used  Substance and Sexual Activity   Alcohol use: No   Drug use: No   Sexual activity: Yes  Other Topics Concern   Not on file  Social History Narrative   Caffeine occ    Social Determinants of Health   Financial Resource Strain: Low Risk  (01/02/2022)   Overall Financial Resource Strain (CARDIA)    Difficulty of Paying Living Expenses: Not hard at all  Food Insecurity: No Food Insecurity (01/02/2022)   Hunger Vital Sign    Worried About Running Out of Food in the Last Year: Never true    Ran Out of Food in the Last Year: Never true  Transportation Needs: No Transportation Needs (01/02/2022)   PRAPARE - Administrator, Civil Service (Medical): No  Lack of Transportation (Non-Medical): No  Physical Activity: Inactive (01/02/2022)   Exercise Vital Sign    Days of Exercise per Week: 0 days    Minutes of Exercise per Session: 0 min  Stress: No Stress Concern Present (01/02/2022)   [employer] of Occupational Health - Occupational Stress Questionnaire    Feeling of Stress : Only a little  Social Connections: Moderately Integrated (01/02/2022)   Social Connection and Isolation Panel [NHANES]    Frequency of Communication with Friends and Family: More than three times a week    Frequency of Social Gatherings with Friends and Family: Once a week    Attends Religious Services: 1 to 4 times per year    Active Member of [employer] or Organizations: No    Attends Banker Meetings: Never    Marital Status: Married  Catering manager Violence: Not At Risk (01/02/2022)   Humiliation, Afraid, Rape, and Kick questionnaire    Fear of Current or Ex-Partner: No    Emotionally  Abused: No    Physically Abused: No    Sexually Abused: No    Outpatient Medications Prior to Visit  Medication Sig Dispense Refill   atenolol (TENORMIN) 25 MG tablet 1 tablet     AUVELITY 45-105 MG TBCR Take by mouth.     butalbital-acetaminophen-caffeine (BAC) 50-325-40 MG tablet Take 1 tablet by mouth 3 (three) times daily as needed. 30 tablet 2   Cholecalciferol 50 MCG (2000 UT) CAPS Vitamin D3 50 mcg (2,000 unit) capsule   1 capsule every day by oral route.     clonazePAM (KLONOPIN) 1 MG tablet Take 1 mg by mouth 3 (three) times daily as needed.     clotrimazole-betamethasone (LOTRISONE) cream Apply 1 Application topically 2 (two) times daily. 30 g 0   fentaNYL (DURAGESIC) 50 MCG/HR Apply 1 patch to skin every 48 hours as directed 15 patch 0   lamoTRIgine (LAMICTAL) 25 MG tablet Take 25 mg by mouth 2 (two) times daily. 1 in the morning and 1 in the evening     levothyroxine (SYNTHROID) 175 MCG tablet Take 1 tablet (175 mcg total) by mouth every morning on an empty stomach. 30 tablet 11   lisinopril (PRINIVIL,ZESTRIL) 20 MG tablet Take 1 tablet by mouth daily.     OVER THE COUNTER MEDICATION Take 3 tablets by mouth daily. cartilast     oxyCODONE-acetaminophen (PERCOCET) 10-325 MG tablet Take 1 tablet by mouth daily. Will take up to 6 tabs a day  0   promethazine (PHENERGAN) 25 MG tablet Take 1 tablet (25 mg total) by mouth as needed. 90 tablet 0   tiZANidine (ZANAFLEX) 4 MG capsule Take 4 mg by mouth 3 (three) times daily as needed for muscle spasms.     estradiol (ESTRACE) 0.1 MG/GM vaginal cream Place vaginally. (Patient not taking: Reported on 09/27/2022)     levothyroxine (SYNTHROID) 150 MCG tablet Take 1 tablet (150 mcg total) by mouth daily before breakfast. (Patient not taking: Reported on 09/27/2022) 30 tablet 2   butalbital-acetaminophen-caffeine (FIORICET) 50-325-40 MG tablet Take 1 tablet by mouth every 4 hours as needed (Patient not taking: Reported on 09/27/2022) 20 tablet 0    fentaNYL (DURAGESIC) 50 MCG/HR Place 1 patch onto the skin every other day (every 48 hours) as directed. (Patient not taking: Reported on 09/27/2022) 15 patch 0   oxyCODONE-acetaminophen (PERCOCET) 10-325 MG tablet Take 1 tablet by mouth every four hours as needed for pain (Patient not taking: Reported on 09/27/2022) 180 tablet  0   oxyCODONE-acetaminophen (PERCOCET) 10-325 MG tablet Take 1 tablet by mouth every 4 (four) hours as needed for pain. (Patient not taking: Reported on 09/27/2022) 180 tablet 0   oxyCODONE-acetaminophen (PERCOCET) 10-325 MG tablet Take 1 tablet by mouth every four hours as needed for pain 02/09/22 (Patient not taking: Reported on 09/27/2022) 180 tablet 0   oxyCODONE-acetaminophen (PERCOCET) 10-325 MG tablet Take 1 tablet by mouth every 4 (four) hours as needed for pain (Patient not taking: Reported on 09/27/2022) 180 tablet 0   oxyCODONE-acetaminophen (PERCOCET) 10-325 MG tablet Take 1 tablet by mouth every 4 (four) hours as needed for pain. (Patient not taking: Reported on 09/27/2022) 180 tablet 0   oxyCODONE-acetaminophen (PERCOCET) 10-325 MG tablet Take 1 tablet by mouth every 4 (four) hours as needed for pain. (Patient not taking: Reported on 09/27/2022) 180 tablet 0   tiZANidine (ZANAFLEX) 4 MG tablet Take 1 tablet (4 mg) by mouth 3 (three) times daily as needed (Patient not taking: Reported on 09/27/2022) 270 tablet 0   tiZANidine (ZANAFLEX) 4 MG tablet Take 1 tablet (4 mg total) by mouth 3 (three) times daily as needed. (Patient not taking: Reported on 09/27/2022) 270 tablet 0   No facility-administered medications prior to visit.    Allergies  Allergen Reactions   Other Other (See Comments)    Oral steroids; skin sensitivity   Biaxin [Clarithromycin] Other (See Comments)    Bad taste and ringing in ear with dec hearing   Clindamycin/Lincomycin Dermatitis   Ketorolac Tromethamine    Pb-Hyoscy-Atropine-Scopolamine    Penicillins     REACTION: HIVES   Phenobarbital     Sulfonamide Derivatives    Topiramate    Zaleplon     Review of Systems  Gastrointestinal:  Positive for diarrhea.       (+)dark green stools  Genitourinary:  Positive for frequency.       (+)cloudy urine       Objective:    Physical Exam Constitutional:      General: She is not in acute distress.    Appearance: Normal appearance. She is not ill-appearing.  HENT:     Head: Normocephalic and atraumatic.     Right Ear: External ear normal.     Left Ear: External ear normal.  Eyes:     Extraocular Movements: Extraocular movements intact.     Pupils: Pupils are equal, round, and reactive to light.  Cardiovascular:     Rate and Rhythm: Normal rate and regular rhythm.     Heart sounds: Normal heart sounds. No murmur heard.    No gallop.  Pulmonary:     Effort: Pulmonary effort is normal. No respiratory distress.     Breath sounds: Normal breath sounds. No wheezing or rales.  Genitourinary:    Rectum: Guaiac result negative.  Skin:    General: Skin is warm and dry.  Neurological:     Mental Status: She is alert and oriented to person, place, and time.  Psychiatric:        Judgment: Judgment normal.     BP 128/76 (BP Location: Left Arm, Patient Position: Sitting, Cuff Size: Large)   Pulse 75   Ht 5\' 5"  (1.651 m)   Wt 210 lb 12.8 oz (95.6 kg)   SpO2 97%   BMI 35.08 kg/m  Wt Readings from Last 3 Encounters:  09/27/22 210 lb 12.8 oz (95.6 kg)  05/17/22 203 lb 3.2 oz (92.2 kg)  11/02/21 208 lb 6.4 oz (94.5 kg)  Assessment & Plan:  Diarrhea, unspecified type Assessment & Plan: Check labs and stool culture    Orders: -     CBC with Differential/Platelet -     Comprehensive metabolic panel -     TSH -     C. difficile GDH and Toxin A/B -     Stool culture  Dysuria -     POCT Urinalysis Dipstick (Automated) -     CBC with Differential/Platelet -     Comprehensive metabolic panel -     TSH -     Urine Culture -     Nitrofurantoin Monohyd Macro; Take 1  capsule (100 mg total) by mouth 2 (two) times daily.  Dispense: 14 capsule; Refill: 0  UTI symptoms Assessment & Plan: Culture pending Macrobid sent in     I, Donato Schultz, DO, personally preformed the services described in this documentation.  All medical record entries made by the scribe were at my direction and in my presence.  I have reviewed the chart and discharge instructions (if applicable) and agree that the record reflects my personal performance and is accurate and complete. 09/27/2022   I,[name],acting as a scribe for Donato Schultz, DO.,have documented all relevant documentation on the behalf of Donato Schultz, DO,as directed by  Donato Schultz, DO while in the presence of Donato Schultz, DO.   Donato Schultz, DO

## 2022-09-28 LAB — CBC WITH DIFFERENTIAL/PLATELET
Basophils Absolute: 0.2 10*3/uL — ABNORMAL HIGH (ref 0.0–0.1)
Basophils Relative: 1.3 % (ref 0.0–3.0)
Eosinophils Absolute: 0.2 10*3/uL (ref 0.0–0.7)
Eosinophils Relative: 1.4 % (ref 0.0–5.0)
HCT: 44.4 % (ref 36.0–46.0)
Hemoglobin: 14.8 g/dL (ref 12.0–15.0)
Lymphocytes Relative: 17.9 % (ref 12.0–46.0)
Lymphs Abs: 2.1 10*3/uL (ref 0.7–4.0)
MCHC: 33.4 g/dL (ref 30.0–36.0)
MCV: 86 fl (ref 78.0–100.0)
Monocytes Absolute: 0.6 10*3/uL (ref 0.1–1.0)
Monocytes Relative: 4.8 % (ref 3.0–12.0)
Neutro Abs: 8.6 10*3/uL — ABNORMAL HIGH (ref 1.4–7.7)
Neutrophils Relative %: 74.6 % (ref 43.0–77.0)
Platelets: 276 10*3/uL (ref 150.0–400.0)
RBC: 5.16 Mil/uL — ABNORMAL HIGH (ref 3.87–5.11)
RDW: 13 % (ref 11.5–15.5)
WBC: 11.5 10*3/uL — ABNORMAL HIGH (ref 4.0–10.5)

## 2022-09-28 LAB — COMPREHENSIVE METABOLIC PANEL
ALT: 39 U/L — ABNORMAL HIGH (ref 0–35)
AST: 50 U/L — ABNORMAL HIGH (ref 0–37)
Albumin: 4.5 g/dL (ref 3.5–5.2)
Alkaline Phosphatase: 111 U/L (ref 39–117)
BUN: 12 mg/dL (ref 6–23)
CO2: 31 mEq/L (ref 19–32)
Calcium: 9.6 mg/dL (ref 8.4–10.5)
Chloride: 100 mEq/L (ref 96–112)
Creatinine, Ser: 1.16 mg/dL (ref 0.40–1.20)
GFR: 50.16 mL/min — ABNORMAL LOW (ref >60.00)
Glucose, Bld: 108 mg/dL — ABNORMAL HIGH (ref 70–99)
Potassium: 5.1 mEq/L (ref 3.5–5.1)
Sodium: 138 mEq/L (ref 135–145)
Total Bilirubin: 0.5 mg/dL (ref 0.2–1.2)
Total Protein: 7.1 g/dL (ref 6.0–8.3)

## 2022-09-28 LAB — TSH: TSH: 0.67 u[IU]/mL (ref 0.35–5.50)

## 2022-09-29 LAB — URINE CULTURE
MICRO NUMBER:: 14843206
SPECIMEN QUALITY:: ADEQUATE

## 2022-10-04 ENCOUNTER — Ambulatory Visit: Payer: Medicare Other

## 2022-10-05 ENCOUNTER — Encounter: Payer: Self-pay | Admitting: Family Medicine

## 2022-10-05 ENCOUNTER — Other Ambulatory Visit (HOSPITAL_BASED_OUTPATIENT_CLINIC_OR_DEPARTMENT_OTHER): Payer: Self-pay

## 2022-10-05 ENCOUNTER — Other Ambulatory Visit: Payer: Medicare Other

## 2022-10-05 DIAGNOSIS — R197 Diarrhea, unspecified: Secondary | ICD-10-CM | POA: Diagnosis not present

## 2022-10-05 NOTE — Addendum Note (Signed)
Addended by: Rosita Kea on: 10/05/2022 03:20 PM   Modules accepted: Orders

## 2022-10-05 NOTE — Telephone Encounter (Signed)
FYI

## 2022-10-08 ENCOUNTER — Other Ambulatory Visit (HOSPITAL_BASED_OUTPATIENT_CLINIC_OR_DEPARTMENT_OTHER): Payer: Self-pay

## 2022-10-09 ENCOUNTER — Other Ambulatory Visit (HOSPITAL_BASED_OUTPATIENT_CLINIC_OR_DEPARTMENT_OTHER): Payer: Self-pay

## 2022-10-09 LAB — STOOL CULTURE: E coli, Shiga toxin Assay: NEGATIVE

## 2022-10-09 MED ORDER — OXYCODONE-ACETAMINOPHEN 10-325 MG PO TABS
1.0000 | ORAL_TABLET | ORAL | 0 refills | Status: DC | PRN
  Filled 2022-10-09: qty 180, 30d supply, fill #0

## 2022-10-09 MED ORDER — FENTANYL 50 MCG/HR TD PT72
1.0000 | MEDICATED_PATCH | TRANSDERMAL | 0 refills | Status: DC
  Filled 2022-10-09: qty 15, 30d supply, fill #0

## 2022-10-11 DIAGNOSIS — N200 Calculus of kidney: Secondary | ICD-10-CM | POA: Diagnosis not present

## 2022-10-17 ENCOUNTER — Ambulatory Visit: Payer: Medicare Other

## 2022-10-18 ENCOUNTER — Other Ambulatory Visit (HOSPITAL_BASED_OUTPATIENT_CLINIC_OR_DEPARTMENT_OTHER): Payer: Self-pay

## 2022-10-18 DIAGNOSIS — Z79891 Long term (current) use of opiate analgesic: Secondary | ICD-10-CM | POA: Diagnosis not present

## 2022-10-18 DIAGNOSIS — G894 Chronic pain syndrome: Secondary | ICD-10-CM | POA: Diagnosis not present

## 2022-10-18 DIAGNOSIS — S32009S Unspecified fracture of unspecified lumbar vertebra, sequela: Secondary | ICD-10-CM | POA: Diagnosis not present

## 2022-10-18 DIAGNOSIS — M4726 Other spondylosis with radiculopathy, lumbar region: Secondary | ICD-10-CM | POA: Diagnosis not present

## 2022-10-18 MED ORDER — FENTANYL 50 MCG/HR TD PT72
1.0000 | MEDICATED_PATCH | TRANSDERMAL | 0 refills | Status: DC
  Filled 2022-11-07: qty 15, 30d supply, fill #0

## 2022-10-18 MED ORDER — OXYCODONE-ACETAMINOPHEN 7.5-325 MG PO TABS
1.0000 | ORAL_TABLET | ORAL | 0 refills | Status: DC | PRN
  Filled 2022-11-07: qty 180, 30d supply, fill #0

## 2022-10-31 ENCOUNTER — Ambulatory Visit
Admission: RE | Admit: 2022-10-31 | Discharge: 2022-10-31 | Disposition: A | Discharge status: Home / Self Care | Payer: Medicare Other | Source: Ambulatory Visit | Attending: Family Medicine | Admitting: Family Medicine

## 2022-10-31 DIAGNOSIS — Z1231 Encounter for screening mammogram for malignant neoplasm of breast: Secondary | ICD-10-CM | POA: Diagnosis not present

## 2022-11-07 ENCOUNTER — Other Ambulatory Visit: Payer: Self-pay

## 2022-11-07 ENCOUNTER — Other Ambulatory Visit (HOSPITAL_BASED_OUTPATIENT_CLINIC_OR_DEPARTMENT_OTHER): Payer: Self-pay

## 2022-11-08 ENCOUNTER — Encounter: Payer: Self-pay | Admitting: Family Medicine

## 2022-11-08 ENCOUNTER — Ambulatory Visit (INDEPENDENT_AMBULATORY_CARE_PROVIDER_SITE_OTHER): Payer: Medicare Other | Admitting: Family Medicine

## 2022-11-08 VITALS — BP 138/90 | HR 62 | Temp 98.2°F | Resp 20 | Ht 65.0 in | Wt 213.2 lb

## 2022-11-08 DIAGNOSIS — I10 Essential (primary) hypertension: Secondary | ICD-10-CM

## 2022-11-08 DIAGNOSIS — I159 Secondary hypertension, unspecified: Secondary | ICD-10-CM | POA: Diagnosis not present

## 2022-11-08 DIAGNOSIS — Z0000 Encounter for general adult medical examination without abnormal findings: Secondary | ICD-10-CM | POA: Diagnosis not present

## 2022-11-08 DIAGNOSIS — E039 Hypothyroidism, unspecified: Secondary | ICD-10-CM | POA: Diagnosis not present

## 2022-11-08 NOTE — Progress Notes (Signed)
Subjective:   By signing my name below, I, Doylene Bode, attest that this documentation has been prepared under the direction and in the presence of Donato Schultz, DO 11/08/22   Patient ID: Karen Brown, female    DOB: 08-28-1960, 62 y.o.   MRN: [id number]  Chief Complaint  Patient presents with   Annual Exam    Pt states not fasting     HPI Patient is in today for a comprehensive physical exam.  Hypertension Treated with atenolol 25 mg daily, lisinopril 20 mg daily.  BP Readings from Last 3 Encounters:  11/08/22 (!) 138/90  09/27/22 128/76  05/17/22 128/70   Nausea Stable with promethazine 25 mg as needed.  Hypothyroidism  Treated with levothyroxine 175 mcg daily in the morning on an empty stomach.  Colitis Believes additive in drinks was causing flare-ups and symptoms are better after cutting it out.  Anxiety and Bipolar Disorder Stable with lamotrigine 25 mg twice daily morning and evening, clonazepam 1 mg three times daily as needed.  Spondylosis Taking Percocet 7.5-325 mg every 4 hours as needed for pain. Did PT for three months earlier this year and felt that it made symptoms worse. Mobility is reduced.  She denies having any fever, new moles, congestion, sore throat, new muscle pain, new joint pain, chest pain, cough, SOB, wheezing, n/v/d, constipation, blood in stool, dysuria, frequency, hematuria, or headaches at this time.   Social History: Denies change in surgical history, family medical history. Mammogram: last completed 11/02/22. No mammographic evidence of malignancy. Recommended repeat in 2024. Colonoscopy: last completed 2011. Recommended repeat in 2021. Vaccine counseling: UTD on flu, tetanus vaccines.  Past Medical History:  Diagnosis Date   Anxiety    Bipolar 1 disorder (HCC)    Chronic migraine    Chronically dry eyes    Colitis, ischemic (HCC)    Complication of anesthesia    versed does not work as stated per pt    Depression     Diverticulosis    Dry mouth    Family history of adverse reaction to anesthesia    Fibromyalgia    GERD (gastroesophageal reflux disease)    Hemorrhoids    Hyperlipemia    Hypertension    Hypothyroidism    IBS (irritable bowel syndrome)    PVC (premature ventricular contraction)     Past Surgical History:  Procedure Laterality Date   ABDOMINAL HYSTERECTOMY     Partial    APPENDECTOMY     BACK SURGERY     Micro diskectomy   BACK SURGERY     L facet rhzotomy   CHOLECYSTECTOMY     EXTRACORPOREAL SHOCK WAVE LITHOTRIPSY Left 03/19/2019   Procedure: EXTRACORPOREAL SHOCK WAVE LITHOTRIPSY (ESWL);  Surgeon: Rene Paci, MD;  Location: [facility];  Service: Urology;  Laterality: Left;   TONSILLECTOMY      Family History  Problem Relation Age of Onset   Kidney disease Father    Coronary artery disease Father 69   COPD Father 52       copd   Heart disease Father    Colon polyps Mother    Osteoporosis Mother    Colon cancer Neg Hx     Social History   Socioeconomic History   Marital status: Married    Spouse name: Not on file   Number of children: 0   Years of education: Not on file   Highest education level: Not on file  Occupational History   Occupation:  Disabled     Employer: [employer]  Tobacco Use   Smoking status: Never   Smokeless tobacco: Never  Vaping Use   Vaping Use: Never used  Substance and Sexual Activity   Alcohol use: No   Drug use: No   Sexual activity: Yes  Other Topics Concern   Not on file  Social History Narrative   Caffeine occ    Social Determinants of Health   Financial Resource Strain: Low Risk  (01/02/2022)   Overall Financial Resource Strain (CARDIA)    Difficulty of Paying Living Expenses: Not hard at all  Food Insecurity: No Food Insecurity (01/02/2022)   Hunger Vital Sign    Worried About Running Out of Food in the Last Year: Never true    Ran Out of Food in the Last Year: Never true  Transportation Needs: No  Transportation Needs (01/02/2022)   PRAPARE - Administrator, Civil Service (Medical): No    Lack of Transportation (Non-Medical): No  Physical Activity: Inactive (01/02/2022)   Exercise Vital Sign    Days of Exercise per Week: 0 days    Minutes of Exercise per Session: 0 min  Stress: No Stress Concern Present (01/02/2022)   [employer] of Occupational Health - Occupational Stress Questionnaire    Feeling of Stress : Only a little  Social Connections: Moderately Integrated (01/02/2022)   Social Connection and Isolation Panel [NHANES]    Frequency of Communication with Friends and Family: More than three times a week    Frequency of Social Gatherings with Friends and Family: Once a week    Attends Religious Services: 1 to 4 times per year    Active Member of [employer] or Organizations: No    Attends Banker Meetings: Never    Marital Status: Married  Catering manager Violence: Not At Risk (01/02/2022)   Humiliation, Afraid, Rape, and Kick questionnaire    Fear of Current or Ex-Partner: No    Emotionally Abused: No    Physically Abused: No    Sexually Abused: No    Outpatient Medications Prior to Visit  Medication Sig Dispense Refill   atenolol (TENORMIN) 25 MG tablet 1 tablet     AUVELITY 45-105 MG TBCR Take by mouth.     butalbital-acetaminophen-caffeine (BAC) 50-325-40 MG tablet Take 1 tablet by mouth 3 (three) times daily as needed. 30 tablet 2   Cholecalciferol 50 MCG (2000 UT) CAPS Vitamin D3 50 mcg (2,000 unit) capsule   1 capsule every day by oral route.     clonazePAM (KLONOPIN) 1 MG tablet Take 1 mg by mouth 3 (three) times daily as needed.     clotrimazole-betamethasone (LOTRISONE) cream Apply 1 Application topically 2 (two) times daily. 30 g 0   fentaNYL (DURAGESIC) 50 MCG/HR Apply 1 patch to skin every 48 hours as directed 15 patch 0   fentaNYL (DURAGESIC) 50 MCG/HR Place 1 patch onto the skin every other day (every 48 hours) as directed. 15 patch  0   lamoTRIgine (LAMICTAL) 25 MG tablet Take 25 mg by mouth 2 (two) times daily. 1 in the morning and 1 in the evening     levothyroxine (SYNTHROID) 175 MCG tablet Take 1 tablet (175 mcg total) by mouth every morning on an empty stomach. 30 tablet 11   lisinopril (PRINIVIL,ZESTRIL) 20 MG tablet Take 1 tablet by mouth daily.     OVER THE COUNTER MEDICATION Take 3 tablets by mouth daily. cartilast     oxyCODONE-acetaminophen (PERCOCET) 10-325 MG  tablet Take 1 tablet by mouth daily. Will take up to 6 tabs a day  0   oxyCODONE-acetaminophen (PERCOCET) 10-325 MG tablet Take 1 tablet by mouth every 4 (four) hours as needed for pain. 180 tablet 0   oxyCODONE-acetaminophen (PERCOCET) 7.5-325 MG tablet Take 1 tablet by mouth every 4 (four) hours as needed for pain. 180 tablet 0   promethazine (PHENERGAN) 25 MG tablet Take 1 tablet (25 mg total) by mouth as needed. 90 tablet 0   tiZANidine (ZANAFLEX) 4 MG capsule Take 4 mg by mouth 3 (three) times daily as needed for muscle spasms.     estradiol (ESTRACE) 0.1 MG/GM vaginal cream Place vaginally. (Patient not taking: Reported on 09/27/2022)     levothyroxine (SYNTHROID) 150 MCG tablet Take 1 tablet (150 mcg total) by mouth daily before breakfast. (Patient not taking: Reported on 09/27/2022) 30 tablet 2   nitrofurantoin, macrocrystal-monohydrate, (MACROBID) 100 MG capsule Take 1 capsule (100 mg total) by mouth 2 (two) times daily. 14 capsule 0   No facility-administered medications prior to visit.    Allergies  Allergen Reactions   Other Other (See Comments)    Oral steroids; skin sensitivity   Biaxin [Clarithromycin] Other (See Comments)    Bad taste and ringing in ear with dec hearing   Clindamycin/Lincomycin Dermatitis   Ketorolac Tromethamine    Pb-Hyoscy-Atropine-Scopolamine    Penicillins     REACTION: HIVES   Phenobarbital    Sulfonamide Derivatives    Topiramate    Zaleplon     Review of Systems  Constitutional:  Negative for chills,  fever, malaise/fatigue and weight loss.  HENT:  Negative for congestion, hearing loss, sinus pain and sore throat.   Eyes:  Negative for blurred vision.  Respiratory:  Negative for cough, hemoptysis and shortness of breath.   Cardiovascular:  Negative for chest pain, palpitations, leg swelling and PND.  Gastrointestinal:  Negative for abdominal pain, constipation, diarrhea, heartburn, nausea and vomiting.  Genitourinary:  Negative for dysuria, frequency and urgency.  Musculoskeletal:  Positive for back pain. Negative for myalgias and neck pain.  Skin:  Negative for itching and rash.  Neurological:  Negative for dizziness, tingling, seizures, loss of consciousness and headaches.  Endo/Heme/Allergies:  Negative for polydipsia.  Psychiatric/Behavioral:  Negative for depression. The patient is not nervous/anxious.        Objective:    Physical Exam Vitals and nursing note reviewed.  Constitutional:      General: She is not in acute distress.    Appearance: Normal appearance. She is not ill-appearing.  HENT:     Head: Normocephalic and atraumatic.     Right Ear: Tympanic membrane, ear canal and external ear normal.     Left Ear: Tympanic membrane, ear canal and external ear normal.  Eyes:     Extraocular Movements: Extraocular movements intact.     Pupils: Pupils are equal, round, and reactive to light.  Cardiovascular:     Rate and Rhythm: Normal rate and regular rhythm.     Heart sounds: Normal heart sounds. No murmur heard.    No gallop.  Pulmonary:     Effort: Pulmonary effort is normal. No respiratory distress.     Breath sounds: Normal breath sounds. No wheezing or rales.  Abdominal:     General: Bowel sounds are normal. There is no distension.     Palpations: Abdomen is soft.     Tenderness: There is no abdominal tenderness. There is no right CVA tenderness or guarding.  Skin:    General: Skin is warm and dry.  Neurological:     Mental Status: She is alert and oriented to  person, place, and time.  Psychiatric:        Judgment: Judgment normal.     BP (!) 138/90 (BP Location: Left Arm, Patient Position: Sitting, Cuff Size: Large)   Pulse 62   Temp 98.2 F (36.8 C) (Oral)   Resp 20   Ht 5\' 5"  (1.651 m)   Wt 213 lb 3.2 oz (96.7 kg)   SpO2 96%   BMI 35.48 kg/m  Wt Readings from Last 3 Encounters:  11/08/22 213 lb 3.2 oz (96.7 kg)  09/27/22 210 lb 12.8 oz (95.6 kg)  05/17/22 203 lb 3.2 oz (92.2 kg)       Assessment & Plan:  Preventative health care Assessment & Plan: Ghm utd Check labs  See AVS  Health Maintenance  Topic Date Due   COVID-19 Vaccine (11 - 2023-24 season) 02/09/2022   Medicare Annual Wellness (AWV)  01/03/2023   Colonoscopy  01/03/2023 (Originally 12/10/2019)   Hepatitis C Screening  07/05/2023 (Originally 03/11/1977)   HIV Screening  07/05/2023 (Originally 03/11/1974)   INFLUENZA VACCINE  01/10/2023   MAMMOGRAM  10/31/2023   DTaP/Tdap/Td (3 - Td or Tdap) 07/05/2027   Zoster Vaccines- Shingrix  Completed   HPV VACCINES  Aged Out      Hypothyroidism, unspecified type Assessment & Plan: Check labs  Lab Results  Component Value Date   TSH 0.67 09/27/2022     Orders: -     TSH  Secondary hypertension  Primary hypertension Assessment & Plan: Well controlled, no changes to meds. Encouraged heart healthy diet such as the DASH diet and exercise as tolerated.    Orders: -     CBC with Differential/Platelet -     Comprehensive metabolic panel -     Lipid panel    I, Donato Schultz, DO, have reviewed all documentation for this visit. The documentation on 11/08/22 for the exam, diagnosis, procedures, and orders are all accurate and complete.   I,Alexander Ruley,acting as a Neurosurgeon for [employer], DO.,have documented all relevant documentation on the behalf of Donato Schultz, DO,as directed by  Donato Schultz, DO while in the presence of Donato Schultz, DO.

## 2022-11-08 NOTE — Patient Instructions (Signed)
Preventive Care 40-64 Years Old, Female Preventive care refers to lifestyle choices and visits with your health care provider that can promote health and wellness. Preventive care visits are also called wellness exams. What can I expect for my preventive care visit? Counseling Your health care provider may ask you questions about your: Medical history, including: Past medical problems. Family medical history. Pregnancy history. Current health, including: Menstrual cycle. Method of birth control. Emotional well-being. Home life and relationship well-being. Sexual activity and sexual health. Lifestyle, including: Alcohol, nicotine or tobacco, and drug use. Access to firearms. Diet, exercise, and sleep habits. Work and work environment. Sunscreen use. Safety issues such as seatbelt and bike helmet use. Physical exam Your health care provider will check your: Height and weight. These may be used to calculate your BMI (body mass index). BMI is a measurement that tells if you are at a healthy weight. Waist circumference. This measures the distance around your waistline. This measurement also tells if you are at a healthy weight and may help predict your risk of certain diseases, such as type 2 diabetes and high blood pressure. Heart rate and blood pressure. Body temperature. Skin for abnormal spots. What immunizations do I need?  Vaccines are usually given at various ages, according to a schedule. Your health care provider will recommend vaccines for you based on your age, medical history, and lifestyle or other factors, such as travel or where you work. What tests do I need? Screening Your health care provider may recommend screening tests for certain conditions. This may include: Lipid and cholesterol levels. Diabetes screening. This is done by checking your blood sugar (glucose) after you have not eaten for a while (fasting). Pelvic exam and Pap test. Hepatitis B test. Hepatitis C  test. HIV (human immunodeficiency virus) test. STI (sexually transmitted infection) testing, if you are at risk. Lung cancer screening. Colorectal cancer screening. Mammogram. Talk with your health care provider about when you should start having regular mammograms. This may depend on whether you have a family history of breast cancer. BRCA-related cancer screening. This may be done if you have a family history of breast, ovarian, tubal, or peritoneal cancers. Bone density scan. This is done to screen for osteoporosis. Talk with your health care provider about your test results, treatment options, and if necessary, the need for more tests. Follow these instructions at home: Eating and drinking  Eat a diet that includes fresh fruits and vegetables, whole grains, lean protein, and low-fat dairy products. Take vitamin and mineral supplements as recommended by your health care provider. Do not drink alcohol if: Your health care provider tells you not to drink. You are pregnant, may be pregnant, or are planning to become pregnant. If you drink alcohol: Limit how much you have to 0-1 drink a day. Know how much alcohol is in your drink. In the [place] one drink equals one 12 oz bottle of beer (355 mL), one 5 oz glass of wine (148 mL), or one 1 oz glass of hard liquor (44 mL). Lifestyle Brush your teeth every morning and night with fluoride toothpaste. Floss one time each day. Exercise for at least 30 minutes 5 or more days each week. Do not use any products that contain nicotine or tobacco. These products include cigarettes, chewing tobacco, and vaping devices, such as e-cigarettes. If you need help quitting, ask your health care provider. Do not use drugs. If you are sexually active, practice safe sex. Use a condom or other form of protection to   prevent STIs. If you do not wish to become pregnant, use a form of birth control. If you plan to become pregnant, see your health care provider for a  prepregnancy visit. Take aspirin only as told by your health care provider. Make sure that you understand how much to take and what form to take. Work with your health care provider to find out whether it is safe and beneficial for you to take aspirin daily. Find healthy ways to manage stress, such as: Meditation, yoga, or listening to music. Journaling. Talking to a trusted person. Spending time with friends and family. Minimize exposure to UV radiation to reduce your risk of skin cancer. Safety Always wear your seat belt while driving or riding in a vehicle. Do not drive: If you have been drinking alcohol. Do not ride with someone who has been drinking. When you are tired or distracted. While texting. If you have been using any mind-altering substances or drugs. Wear a helmet and other protective equipment during sports activities. If you have firearms in your house, make sure you follow all gun safety procedures. Seek help if you have been physically or sexually abused. What's next? Visit your health care provider once a year for an annual wellness visit. Ask your health care provider how often you should have your eyes and teeth checked. Stay up to date on all vaccines. This information is not intended to replace advice given to you by your health care provider. Make sure you discuss any questions you have with your health care provider. Document Revised: 11/23/2020 Document Reviewed: 11/23/2020 Elsevier Patient Education  2024 Elsevier Inc.  

## 2022-11-08 NOTE — Assessment & Plan Note (Signed)
Check labs  Lab Results  Component Value Date   TSH 0.67 09/27/2022

## 2022-11-08 NOTE — Assessment & Plan Note (Signed)
Ghm utd Check labs  See AVS  Health Maintenance  Topic Date Due   COVID-19 Vaccine (11 - 2023-24 season) 02/09/2022   Medicare Annual Wellness (AWV)  01/03/2023   Colonoscopy  01/03/2023 (Originally 12/10/2019)   Hepatitis C Screening  07/05/2023 (Originally 03/11/1979)   HIV Screening  07/05/2023 (Originally [birth year]/11/1974)   INFLUENZA VACCINE  01/10/2023   MAMMOGRAM  10/31/2023   DTaP/Tdap/Td (3 - Td or Tdap) 07/05/2027   Zoster Vaccines- Shingrix  Completed   HPV VACCINES  Aged Out

## 2022-11-08 NOTE — Assessment & Plan Note (Signed)
Well controlled, no changes to meds. Encouraged heart healthy diet such as the DASH diet and exercise as tolerated.  °

## 2022-11-09 LAB — COMPREHENSIVE METABOLIC PANEL
ALT: 20 U/L (ref 0–35)
AST: 21 U/L (ref 0–37)
Albumin: 4.5 g/dL (ref 3.5–5.2)
Alkaline Phosphatase: 115 U/L (ref 39–117)
BUN: 19 mg/dL (ref 6–23)
CO2: 30 mEq/L (ref 19–32)
Calcium: 9.9 mg/dL (ref 8.4–10.5)
Chloride: 99 mEq/L (ref 96–112)
Creatinine, Ser: 1.19 mg/dL (ref 0.40–1.20)
GFR: 48.61 mL/min — ABNORMAL LOW (ref >60.00)
Glucose, Bld: 93 mg/dL (ref 70–99)
Potassium: 4.4 mEq/L (ref 3.5–5.1)
Sodium: 139 mEq/L (ref 135–145)
Total Bilirubin: 0.3 mg/dL (ref 0.2–1.2)
Total Protein: 7.6 g/dL (ref 6.0–8.3)

## 2022-11-09 LAB — LIPID PANEL
Cholesterol: 306 mg/dL — ABNORMAL HIGH (ref 0–200)
HDL: 49.8 mg/dL (ref >39.00)
NonHDL: 256.18
Total CHOL/HDL Ratio: 6
Triglycerides: 339 mg/dL — ABNORMAL HIGH (ref 0.0–149.0)
VLDL: 67.8 mg/dL — ABNORMAL HIGH (ref 0.0–40.0)

## 2022-11-09 LAB — CBC WITH DIFFERENTIAL/PLATELET
Basophils Absolute: 0.1 10*3/uL (ref 0.0–0.1)
Basophils Relative: 0.8 % (ref 0.0–3.0)
Eosinophils Absolute: 0.2 10*3/uL (ref 0.0–0.7)
Eosinophils Relative: 1.8 % (ref 0.0–5.0)
HCT: 45.2 % (ref 36.0–46.0)
Hemoglobin: 14.7 g/dL (ref 12.0–15.0)
Lymphocytes Relative: 24.8 % (ref 12.0–46.0)
Lymphs Abs: 2.3 10*3/uL (ref 0.7–4.0)
MCHC: 32.4 g/dL (ref 30.0–36.0)
MCV: 86.6 fl (ref 78.0–100.0)
Monocytes Absolute: 0.6 10*3/uL (ref 0.1–1.0)
Monocytes Relative: 6.2 % (ref 3.0–12.0)
Neutro Abs: 6.2 10*3/uL (ref 1.4–7.7)
Neutrophils Relative %: 66.4 % (ref 43.0–77.0)
Platelets: 326 10*3/uL (ref 150.0–400.0)
RBC: 5.23 Mil/uL — ABNORMAL HIGH (ref 3.87–5.11)
RDW: 12.9 % (ref 11.5–15.5)
WBC: 9.3 10*3/uL (ref 4.0–10.5)

## 2022-11-09 LAB — LDL CHOLESTEROL, DIRECT: Direct LDL: 213 mg/dL

## 2022-11-09 LAB — TSH: TSH: 0.34 u[IU]/mL — ABNORMAL LOW (ref 0.35–5.50)

## 2022-11-14 ENCOUNTER — Other Ambulatory Visit: Payer: Self-pay | Admitting: *Deleted

## 2022-11-14 ENCOUNTER — Other Ambulatory Visit: Payer: Self-pay | Admitting: Family Medicine

## 2022-11-14 DIAGNOSIS — E039 Hypothyroidism, unspecified: Secondary | ICD-10-CM

## 2022-11-14 DIAGNOSIS — E781 Pure hyperglyceridemia: Secondary | ICD-10-CM

## 2022-11-15 ENCOUNTER — Other Ambulatory Visit (HOSPITAL_BASED_OUTPATIENT_CLINIC_OR_DEPARTMENT_OTHER): Payer: Self-pay

## 2022-11-15 DIAGNOSIS — Z79891 Long term (current) use of opiate analgesic: Secondary | ICD-10-CM | POA: Diagnosis not present

## 2022-11-15 DIAGNOSIS — G894 Chronic pain syndrome: Secondary | ICD-10-CM | POA: Diagnosis not present

## 2022-11-15 DIAGNOSIS — M62838 Other muscle spasm: Secondary | ICD-10-CM | POA: Diagnosis not present

## 2022-11-15 DIAGNOSIS — M4726 Other spondylosis with radiculopathy, lumbar region: Secondary | ICD-10-CM | POA: Diagnosis not present

## 2022-11-15 MED ORDER — FENTANYL 50 MCG/HR TD PT72
1.0000 | MEDICATED_PATCH | TRANSDERMAL | 0 refills | Status: DC
  Filled 2022-12-11: qty 15, 30d supply, fill #0

## 2022-11-15 MED ORDER — OXYCODONE-ACETAMINOPHEN 7.5-325 MG PO TABS
1.0000 | ORAL_TABLET | ORAL | 0 refills | Status: DC | PRN
  Filled 2022-12-11: qty 180, 30d supply, fill #0

## 2022-12-11 ENCOUNTER — Other Ambulatory Visit (HOSPITAL_BASED_OUTPATIENT_CLINIC_OR_DEPARTMENT_OTHER): Payer: Self-pay

## 2022-12-11 ENCOUNTER — Other Ambulatory Visit: Payer: Self-pay | Admitting: Family Medicine

## 2022-12-11 DIAGNOSIS — R110 Nausea: Secondary | ICD-10-CM

## 2022-12-11 MED ORDER — PROMETHAZINE HCL 25 MG PO TABS: 25.0000 mg | ORAL_TABLET | ORAL | 0 refills | Status: DC | PRN

## 2022-12-11 MED ORDER — LEVOTHYROXINE SODIUM 175 MCG PO TABS
175.0000 ug | ORAL_TABLET | Freq: Every morning | ORAL | 11 refills | Status: DC
  Filled 2022-12-11: qty 30, 30d supply, fill #0
  Filled 2023-01-06: qty 30, 30d supply, fill #1

## 2022-12-12 DIAGNOSIS — M62838 Other muscle spasm: Secondary | ICD-10-CM | POA: Diagnosis not present

## 2022-12-12 DIAGNOSIS — G894 Chronic pain syndrome: Secondary | ICD-10-CM | POA: Diagnosis not present

## 2022-12-12 DIAGNOSIS — Z79891 Long term (current) use of opiate analgesic: Secondary | ICD-10-CM | POA: Diagnosis not present

## 2022-12-12 DIAGNOSIS — M4726 Other spondylosis with radiculopathy, lumbar region: Secondary | ICD-10-CM | POA: Diagnosis not present

## 2022-12-27 ENCOUNTER — Other Ambulatory Visit (HOSPITAL_BASED_OUTPATIENT_CLINIC_OR_DEPARTMENT_OTHER): Payer: Self-pay

## 2022-12-27 DIAGNOSIS — N200 Calculus of kidney: Secondary | ICD-10-CM | POA: Diagnosis not present

## 2022-12-27 DIAGNOSIS — R7301 Impaired fasting glucose: Secondary | ICD-10-CM | POA: Diagnosis not present

## 2022-12-27 DIAGNOSIS — I10 Essential (primary) hypertension: Secondary | ICD-10-CM | POA: Diagnosis not present

## 2022-12-27 DIAGNOSIS — E7800 Pure hypercholesterolemia, unspecified: Secondary | ICD-10-CM | POA: Diagnosis not present

## 2022-12-27 DIAGNOSIS — E559 Vitamin D deficiency, unspecified: Secondary | ICD-10-CM | POA: Diagnosis not present

## 2022-12-27 DIAGNOSIS — E039 Hypothyroidism, unspecified: Secondary | ICD-10-CM | POA: Diagnosis not present

## 2022-12-27 MED ORDER — LEVOTHYROXINE SODIUM 175 MCG PO TABS
175.0000 ug | ORAL_TABLET | Freq: Every morning | ORAL | 11 refills | Status: DC
  Filled 2022-12-27: qty 26, 30d supply, fill #0
  Filled 2023-01-03: qty 26, 26d supply, fill #0

## 2023-01-03 ENCOUNTER — Other Ambulatory Visit (HOSPITAL_BASED_OUTPATIENT_CLINIC_OR_DEPARTMENT_OTHER): Payer: Self-pay

## 2023-01-09 ENCOUNTER — Ambulatory Visit: Payer: Medicare Other | Admitting: Emergency Medicine

## 2023-01-09 VITALS — Ht 65.0 in | Wt 213.0 lb

## 2023-01-09 DIAGNOSIS — Z0000 Encounter for general adult medical examination without abnormal findings: Secondary | ICD-10-CM | POA: Diagnosis not present

## 2023-01-09 DIAGNOSIS — Z1159 Encounter for screening for other viral diseases: Secondary | ICD-10-CM

## 2023-01-09 NOTE — Patient Instructions (Signed)
Ms. Sittler , Thank you for taking time to come for your Medicare Wellness Visit. I appreciate your ongoing commitment to your health goals. Please review the following plan we discussed and let me know if I can assist you in the future.   Referrals/Orders/Follow-Ups/Clinician Recommendations: Order placed for Hepatitis C screening to be done with your next lab work.  This is a list of the screening recommended for you and due dates:  Health Maintenance  Topic Date Due   Colon Cancer Screening  06/12/2023*   Hepatitis C Screening  07/05/2023*   HIV Screening  07/05/2023*   Flu Shot  01/10/2023   Mammogram  10/31/2023   DEXA scan (bone density measurement)  11/10/2023   Medicare Annual Wellness Visit  01/09/2024   DTaP/Tdap/Td vaccine (3 - Td or Tdap) 07/05/2027   Zoster (Shingles) Vaccine  Completed   HPV Vaccine  Aged Out   COVID-19 Vaccine  Discontinued  *Topic was postponed. The date shown is not the original due date.    Advanced directives: Information on Advanced Care Planning can be found at [facility] Advance Health Care Directives Advance Health Care Directives ([email or web address]    Next Medicare Annual Wellness Visit scheduled for next year: Yes, 01/14/24 @ 1:40pm  Preventive Care 65 Years and Older, Female Preventive care refers to lifestyle choices and visits with your health care provider that can promote health and wellness. What does preventive care include? A yearly physical exam. This is also called an annual well check. Dental exams once or twice a year. Routine eye exams. Ask your health care provider how often you should have your eyes checked. Personal lifestyle choices, including: Daily care of your teeth and gums. Regular physical activity. Eating a healthy diet. Avoiding tobacco and drug use. Limiting alcohol use. Practicing safe sex. Taking low-dose aspirin every day. Taking vitamin and mineral supplements as recommended by your health care  provider. What happens during an annual well check? The services and screenings done by your health care provider during your annual well check will depend on your age, overall health, lifestyle risk factors, and family history of disease. Counseling  Your health care provider may ask you questions about your: Alcohol use. Tobacco use. Drug use. Emotional well-being. Home and relationship well-being. Sexual activity. Eating habits. History of falls. Memory and ability to understand (cognition). Work and work Astronomer. Reproductive health. Screening  You may have the following tests or measurements: Height, weight, and BMI. Blood pressure. Lipid and cholesterol levels. These may be checked every 5 years, or more frequently if you are over 2 years old. Skin check. Lung cancer screening. You may have this screening every year starting at age 76 if you have a 30-pack-year history of smoking and currently smoke or have quit within the past 15 years. Fecal occult blood test (FOBT) of the stool. You may have this test every year starting at age 32. Flexible sigmoidoscopy or colonoscopy. You may have a sigmoidoscopy every 5 years or a colonoscopy every 10 years starting at age 47. Hepatitis C blood test. Hepatitis B blood test. Sexually transmitted disease (STD) testing. Diabetes screening. This is done by checking your blood sugar (glucose) after you have not eaten for a while (fasting). You may have this done every 1-3 years. Bone density scan. This is done to screen for osteoporosis. You may have this done starting at age 63. Mammogram. This may be done every 1-2 years. Talk to your health care provider about how  often you should have regular mammograms. Talk with your health care provider about your test results, treatment options, and if necessary, the need for more tests. Vaccines  Your health care provider may recommend certain vaccines, such as: Influenza vaccine. This is  recommended every year. Tetanus, diphtheria, and acellular pertussis (Tdap, Td) vaccine. You may need a Td booster every 10 years. Zoster vaccine. You may need this after age 51. Pneumococcal 13-valent conjugate (PCV13) vaccine. One dose is recommended after age 71. Pneumococcal polysaccharide (PPSV23) vaccine. One dose is recommended after age 30. Talk to your health care provider about which screenings and vaccines you need and how often you need them. This information is not intended to replace advice given to you by your health care provider. Make sure you discuss any questions you have with your health care provider. Document Released: 06/24/2015 Document Revised: 02/15/2016 Document Reviewed: 03/29/2015 Elsevier Interactive Patient Education  2017 [employer].  Fall Prevention in the Home Falls can cause injuries. They can happen to people of all ages. There are many things you can do to make your home safe and to help prevent falls. What can I do on the outside of my home? Regularly fix the edges of walkways and driveways and fix any cracks. Remove anything that might make you trip as you walk through a door, such as a raised step or threshold. Trim any bushes or trees on the path to your home. Use bright outdoor lighting. Clear any walking paths of anything that might make someone trip, such as rocks or tools. Regularly check to see if handrails are loose or broken. Make sure that both sides of any steps have handrails. Any raised decks and porches should have guardrails on the edges. Have any leaves, snow, or ice cleared regularly. Use sand or salt on walking paths during winter. Clean up any spills in your garage right away. This includes oil or grease spills. What can I do in the bathroom? Use night lights. Install grab bars by the toilet and in the tub and shower. Do not use towel bars as grab bars. Use non-skid mats or decals in the tub or shower. If you need to sit down in  the shower, use a plastic, non-slip stool. Keep the floor dry. Clean up any water that spills on the floor as soon as it happens. Remove soap buildup in the tub or shower regularly. Attach bath mats securely with double-sided non-slip rug tape. Do not have throw rugs and other things on the floor that can make you trip. What can I do in the bedroom? Use night lights. Make sure that you have a light by your bed that is easy to reach. Do not use any sheets or blankets that are too big for your bed. They should not hang down onto the floor. Have a firm chair that has side arms. You can use this for support while you get dressed. Do not have throw rugs and other things on the floor that can make you trip. What can I do in the kitchen? Clean up any spills right away. Avoid walking on wet floors. Keep items that you use a lot in easy-to-reach places. If you need to reach something above you, use a strong step stool that has a grab bar. Keep electrical cords out of the way. Do not use floor polish or wax that makes floors slippery. If you must use wax, use non-skid floor wax. Do not have throw rugs and other things  on the floor that can make you trip. What can I do with my stairs? Do not leave any items on the stairs. Make sure that there are handrails on both sides of the stairs and use them. Fix handrails that are broken or loose. Make sure that handrails are as long as the stairways. Check any carpeting to make sure that it is firmly attached to the stairs. Fix any carpet that is loose or worn. Avoid having throw rugs at the top or bottom of the stairs. If you do have throw rugs, attach them to the floor with carpet tape. Make sure that you have a light switch at the top of the stairs and the bottom of the stairs. If you do not have them, ask someone to add them for you. What else can I do to help prevent falls? Wear shoes that: Do not have high heels. Have rubber bottoms. Are comfortable  and fit you well. Are closed at the toe. Do not wear sandals. If you use a stepladder: Make sure that it is fully opened. Do not climb a closed stepladder. Make sure that both sides of the stepladder are locked into place. Ask someone to hold it for you, if possible. Clearly mark and make sure that you can see: Any grab bars or handrails. First and last steps. Where the edge of each step is. Use tools that help you move around (mobility aids) if they are needed. These include: Canes. Walkers. Scooters. Crutches. Turn on the lights when you go into a dark area. Replace any light bulbs as soon as they burn out. Set up your furniture so you have a clear path. Avoid moving your furniture around. If any of your floors are uneven, fix them. If there are any pets around you, be aware of where they are. Review your medicines with your doctor. Some medicines can make you feel dizzy. This can increase your chance of falling. Ask your doctor what other things that you can do to help prevent falls. This information is not intended to replace advice given to you by your health care provider. Make sure you discuss any questions you have with your health care provider. Document Released: 03/24/2009 Document Revised: 11/03/2015 Document Reviewed: 07/02/2014 Elsevier Interactive Patient Education  2017 Elsevier Inc. Managing Pain Without Opioids Opioids are strong medicines used to treat moderate to severe pain. For some people, especially those who have long-term (chronic) pain, opioids may not be the best choice for pain management due to: Side effects like nausea, constipation, and sleepiness. The risk of addiction (opioid use disorder). The longer you take opioids, the greater your risk of addiction. Pain that lasts for more than 3 months is called chronic pain. Managing chronic pain usually requires more than one approach and is often provided by a team of health care providers working together  (multidisciplinary approach). Pain management may be done at a pain management center or pain clinic. How to manage pain without the use of opioids Use non-opioid medicines Non-opioid medicines for pain may include: Over-the-counter or prescription non-steroidal anti-inflammatory drugs (NSAIDs). These may be the first medicines used for pain. They work well for muscle and bone pain, and they reduce swelling. Acetaminophen. This over-the-counter medicine may work well for milder pain but not swelling. Antidepressants. These may be used to treat chronic pain. A certain type of antidepressant (tricyclics) is often used. These medicines are given in lower doses for pain than when used for depression. Anticonvulsants. These are usually  used to treat seizures but may also reduce nerve (neuropathic) pain. Muscle relaxants. These relieve pain caused by sudden muscle tightening (spasms). You may also use a pain medicine that is applied to the skin as a patch, cream, or gel (topical analgesic), such as a numbing medicine. These may cause fewer side effects than medicines taken by mouth. Do certain therapies as directed Some therapies can help with pain management. They include: Physical therapy. You will do exercises to gain strength and flexibility. A physical therapist may teach you exercises to move and stretch parts of your body that are weak, stiff, or painful. You can learn these exercises at physical therapy visits and practice them at home. Physical therapy may also involve: Massage. Heat wraps or applying heat or cold to affected areas. Electrical signals that interrupt pain signals (transcutaneous electrical nerve stimulation, TENS). Weak lasers that reduce pain and swelling (low-level laser therapy). Signals from your body that help you learn to regulate pain (biofeedback). Occupational therapy. This helps you to learn ways to function at home and work with less pain. Recreational therapy. This  involves trying new activities or hobbies, such as a physical activity or drawing. Mental health therapy, including: Cognitive behavioral therapy (CBT). This helps you learn coping skills for dealing with pain. Acceptance and commitment therapy (ACT) to change the way you think and react to pain. Relaxation therapies, including muscle relaxation exercises and mindfulness-based stress reduction. Pain management counseling. This may be individual, family, or group counseling.  Receive medical treatments Medical treatments for pain management include: Nerve block injections. These may include a pain blocker and anti-inflammatory medicines. You may have injections: Near the spine to relieve chronic back or neck pain. Into joints to relieve back or joint pain. Into nerve areas that supply a painful area to relieve body pain. Into muscles (trigger point injections) to relieve some painful muscle conditions. A medical device placed near your spine to help block pain signals and relieve nerve pain or chronic back pain (spinal cord stimulation device). Acupuncture. Follow these instructions at home Medicines Take over-the-counter and prescription medicines only as told by your health care provider. If you are taking pain medicine, ask your health care providers about possible side effects to watch out for. Do not drive or use heavy machinery while taking prescription opioid pain medicine. Lifestyle  Do not use drugs or alcohol to reduce pain. If you drink alcohol, limit how much you have to: 0-1 drink a day for women who are not pregnant. 0-2 drinks a day for men. Know how much alcohol is in a drink. In the [place] one drink equals one 12 oz bottle of beer (355 mL), one 5 oz glass of wine (148 mL), or one 1 oz glass of hard liquor (44 mL). Do not use any products that contain nicotine or tobacco. These products include cigarettes, chewing tobacco, and vaping devices, such as e-cigarettes. If you  need help quitting, ask your health care provider. Eat a healthy diet and maintain a healthy weight. Poor diet and excess weight may make pain worse. Eat foods that are high in fiber. These include fresh fruits and vegetables, whole grains, and beans. Limit foods that are high in fat and processed sugars, such as fried and sweet foods. Exercise regularly. Exercise lowers stress and may help relieve pain. Ask your health care provider what activities and exercises are safe for you. If your health care provider approves, join an exercise class that combines movement and stress reduction. Examples  include yoga and tai chi. Get enough sleep. Lack of sleep may make pain worse. Lower stress as much as possible. Practice stress reduction techniques as told by your therapist.  General instructions Work with all your pain management providers to find the treatments that work best for you. You are an important member of your pain management team. There are many things you can do to reduce pain on your own. Consider joining an online or in-person support group for people who have chronic pain. Keep all follow-up visits. This is important. Where to find more information You can find more information about managing pain without opioids from: American Academy of Pain Medicine: painmed.org Institute for Chronic Pain: instituteforchronicpain.org American Chronic Pain Association: theacpa.org Contact a health care provider if: You have side effects from pain medicine. Your pain gets worse or does not get better with treatments or home therapy. You are struggling with anxiety or depression. Summary Many types of pain can be managed without opioids. Chronic pain may respond better to pain management without opioids. Pain is best managed when you and a team of health care providers work together. Pain management without opioids may include non-opioid medicines, medical treatments, physical therapy, mental  health therapy, and lifestyle changes. Tell your health care providers if your pain gets worse or is not being managed well enough. This information is not intended to replace advice given to you by your health care provider. Make sure you discuss any questions you have with your health care provider. Document Revised: 09/07/2020 Document Reviewed: 09/07/2020 Elsevier Patient Education  2024 [employer].

## 2023-01-09 NOTE — Progress Notes (Signed)
Subjective:   Karen Brown is a 62 y.o. female who presents for Medicare Annual (Subsequent) preventive examination.  Visit Complete: Virtual  I connected with  Karen Brown on 01/09/23 by a audio enabled telemedicine application and verified that I am speaking with the correct person using two identifiers.  Patient Location: Home  Provider Location: Home Office  I discussed the limitations of evaluation and management by telemedicine. The patient expressed understanding and agreed to proceed.  Patient Medicare AWV questionnaire was completed by the patient on 01/08/23; I have confirmed that all information answered by patient is correct and no changes since this date.  Vital Signs: Unable to obtain new vitals due to this being a telehealth visit.   Review of Systems     Cardiac Risk Factors include: hypertension;obesity (BMI >30kg/m2);sedentary lifestyle     Objective:    Today's Vitals   01/08/23 1656 01/09/23 1357  Weight:  213 lb (96.6 kg)  Height:  5\' 5"  (1.651 m)  PainSc: 5     Body mass index is 35.45 kg/m.     01/09/2023    2:38 PM 01/02/2022    1:17 PM 12/13/2020    2:28 PM 07/24/2019    3:20 PM 03/19/2019    7:41 AM 07/22/2018    3:34 PM 07/04/2017    2:50 PM  Advanced Directives  Does Patient Have a Medical Advance Directive? No Yes No No No No No  Type of Energy manager of Healthcare Power of Attorney in Chart?  No - copy requested       Would patient like information on creating a medical advance directive? Yes (MAU/Ambulatory/Procedural Areas - Information given)  No - Patient declined No - Patient declined No - Patient declined Yes (MAU/Ambulatory/Procedural Areas - Information given) Yes (MAU/Ambulatory/Procedural Areas - Information given)    Current Medications (verified) Outpatient Encounter Medications as of 01/09/2023  Medication Sig   atenolol (TENORMIN) 25 MG tablet 1 tablet   AUVELITY 45-105 MG TBCR  Take by mouth.   butalbital-acetaminophen-caffeine (BAC) 50-325-40 MG tablet Take 1 tablet by mouth 3 (three) times daily as needed.   Cholecalciferol 50 MCG (2000 UT) CAPS Vitamin D3 50 mcg (2,000 unit) capsule   1 capsule every day by oral route.   clonazePAM (KLONOPIN) 1 MG tablet Take 1 mg by mouth 3 (three) times daily as needed.   clotrimazole-betamethasone (LOTRISONE) cream Apply 1 Application topically 2 (two) times daily.   fentaNYL (DURAGESIC) 50 MCG/HR Place 1 patch onto the skin every other day (every 48 hours).   lamoTRIgine (LAMICTAL) 25 MG tablet Take 25 mg by mouth 2 (two) times daily. 1 in the morning and 1 in the evening   levothyroxine (SYNTHROID) 175 MCG tablet Take 1 tablet (175 mcg total) by mouth in the morning on an empty stomach.   lisinopril (PRINIVIL,ZESTRIL) 20 MG tablet Take 1 tablet by mouth daily.   OVER THE COUNTER MEDICATION Take 3 tablets by mouth daily. cartilast   oxyCODONE-acetaminophen (PERCOCET) 7.5-325 MG tablet Take 1 tablet by mouth every 4 (four) hours as needed for pain.   promethazine (PHENERGAN) 25 MG tablet Take 1 tablet (25 mg total) by mouth as needed.   tiZANidine (ZANAFLEX) 4 MG capsule Take 4 mg by mouth 3 (three) times daily as needed for muscle spasms.   fentaNYL (DURAGESIC) 50 MCG/HR Apply 1 patch to skin every 48 hours as directed (Patient not taking:  Reported on 01/09/2023)   levothyroxine (SYNTHROID) 150 MCG tablet Take 150 mcg by mouth daily before breakfast. (Patient not taking: Reported on 01/09/2023)   levothyroxine (SYNTHROID) 175 MCG tablet Take 1 tablet (175 mcg total) by mouth in the morning on an empty stomach, 6 days/week for 30 days. (Patient not taking: Reported on 01/09/2023)   oxyCODONE-acetaminophen (PERCOCET) 10-325 MG tablet Take 1 tablet by mouth daily. Will take up to 6 tabs a day (Patient not taking: Reported on 01/09/2023)   oxyCODONE-acetaminophen (PERCOCET) 10-325 MG tablet Take 1 tablet by mouth every 4 (four) hours as  needed for pain. (Patient not taking: Reported on 01/09/2023)   No facility-administered encounter medications on file as of 01/09/2023.    Allergies (verified) Other, Sulindac, Biaxin [clarithromycin], Calcium-containing compounds, Clindamycin/lincomycin, Ketorolac tromethamine, Metoprolol tartrate, Pb-hyoscy-atropine-scopolamine, Penicillins, Phenobarbital, Sulfonamide derivatives, Topiramate, Vortioxetine, and Zaleplon   History: Past Medical History:  Diagnosis Date   Anxiety    Bipolar 1 disorder (HCC)    Chronic migraine    Chronically dry eyes    Colitis, ischemic (HCC)    Complication of anesthesia    versed does not work as stated per pt    Depression    Diverticulosis    Dry mouth    Family history of adverse reaction to anesthesia    Fibromyalgia    GERD (gastroesophageal reflux disease)    Hemorrhoids    Hyperlipemia    Hypertension    Hypothyroidism    IBS (irritable bowel syndrome)    PVC (premature ventricular contraction)    Past Surgical History:  Procedure Laterality Date   ABDOMINAL HYSTERECTOMY     Partial    APPENDECTOMY     BACK SURGERY     Micro diskectomy   BACK SURGERY     L facet rhzotomy   CHOLECYSTECTOMY     EXTRACORPOREAL SHOCK WAVE LITHOTRIPSY Left 03/19/2019   Procedure: EXTRACORPOREAL SHOCK WAVE LITHOTRIPSY (ESWL);  Surgeon: Rene Paci, MD;  Location: [facility];  Service: Urology;  Laterality: Left;   TONSILLECTOMY     Family History  Problem Relation Age of Onset   Kidney disease Father    Coronary artery disease Father 4   COPD Father 71       copd   Heart disease Father    Colon polyps Mother    Osteoporosis Mother    Colon cancer Neg Hx    Social History   Socioeconomic History   Marital status: Married    Spouse name: Acupuncturist   Number of children: 0   Years of education: Not on file   Highest education level: Not on file  Occupational History   Occupation: Disabled     Employer: [employer]  Tobacco Use    Smoking status: Never   Smokeless tobacco: Never  Vaping Use   Vaping status: Never Used  Substance and Sexual Activity   Alcohol use: No   Drug use: No   Sexual activity: Yes  Other Topics Concern   Not on file  Social History Narrative   Married, disabled, unable to have children   Social Determinants of Health   Financial Resource Strain: Low Risk  (01/08/2023)   Overall Financial Resource Strain (CARDIA)    Difficulty of Paying Living Expenses: Not hard at all  Food Insecurity: No Food Insecurity (01/08/2023)   Hunger Vital Sign    Worried About Running Out of Food in the Last Year: Never true    Ran Out of Food in the Last  Year: Never true  Transportation Needs: No Transportation Needs (01/08/2023)   PRAPARE - Administrator, Civil Service (Medical): No    Lack of Transportation (Non-Medical): No  Physical Activity: Inactive (01/08/2023)   Exercise Vital Sign    Days of Exercise per Week: 0 days    Minutes of Exercise per Session: 0 min  Stress: Stress Concern Present (01/08/2023)   [employer] of Occupational Health - Occupational Stress Questionnaire    Feeling of Stress : Rather much  Social Connections: Moderately Integrated (01/08/2023)   Social Connection and Isolation Panel [NHANES]    Frequency of Communication with Friends and Family: More than three times a week    Frequency of Social Gatherings with Friends and Family: Once a week    Attends Religious Services: Never    Database administrator or Organizations: Yes    Attends Banker Meetings: Never    Marital Status: Married    Tobacco Counseling Counseling given: Not Answered   Clinical Intake:  Pre-visit preparation completed: Yes  Pain : 0-10 Pain Score: 5  Pain Type: Chronic pain Pain Location: Back Pain Descriptors / Indicators: Aching     BMI - recorded: 35.45 Nutritional Status: BMI > 30  Obese Nutritional Risks: None Diabetes: No  How often do you need to  have someone help you when you read instructions, pamphlets, or other written materials from your doctor or pharmacy?: 1 - Never  Interpreter Needed?: No  Information entered by :: Tora Kindred, CMA   Activities of Daily Living    01/08/2023    4:56 PM  In your present state of health, do you have any difficulty performing the following activities:  Hearing? 0  Vision? 0  Difficulty concentrating or making decisions? 0  Walking or climbing stairs? 1  Comment chronic back issues  Dressing or bathing? 1  Comment chronic back pain  Doing errands, shopping? 1  Comment chronic back pain  Preparing Food and eating ? Y  Comment husband prepares all food  Using the Toilet? N  In the past six months, have you accidently leaked urine? Y  Comment once  Do you have problems with loss of bowel control? N  Managing your Medications? N  Managing your Finances? N  Housekeeping or managing your Housekeeping? Y  Comment husband does all housekeeping    Patient Care Team: Zola Button, Grayling Congress, DO as PCP - General Thyra Breed, MD as Consulting Physician (Anesthesiology) Dorisann Frames, MD as Consulting Physician (Endocrinology) Estill Bamberg, MD as Consulting Physician (Orthopedic Surgery) Milagros Evener, MD as Consulting Physician (Psychiatry)  Indicate any recent Medical Services you may have received from other than [facility] providers in the past year (date may be approximate).     Assessment:   This is a routine wellness examination for Stevi.  Hearing/Vision screen Hearing Screening - Comments:: Denies hearing loss  Dietary issues and exercise activities discussed:     Goals Addressed               This Visit's Progress     DIET - EAT MORE FRUITS AND VEGETABLES (pt-stated)        Better eating habits      Patient Stated   Not on track     Losing weight and change eating habits       Depression Screen    01/09/2023    2:34 PM 09/27/2022    2:53 PM 01/02/2022     1:15 PM  12/13/2020    2:31 PM 07/24/2019    3:27 PM 07/22/2018    5:57 PM 07/22/2018    3:35 PM  PHQ 2/9 Scores  PHQ - 2 Score 2 0 1 1 0 4 1  PHQ- 9 Score 3  4   11      Fall Risk    01/08/2023    4:56 PM 09/27/2022    2:52 PM 01/02/2022    1:17 PM 12/13/2020    2:30 PM 07/24/2019    3:26 PM  Fall Risk   Falls in the past year? 1 1 0 0 0  Number falls in past yr: 0 1 0 0 0  Injury with Fall? 1 1 0 0 0  Risk for fall due to : History of fall(s);Impaired mobility;Medication side effect;Orthopedic patient History of fall(s) Impaired vision    Follow up Falls prevention discussed;Falls evaluation completed Falls evaluation completed Falls prevention discussed Falls prevention discussed Education provided;Falls prevention discussed    MEDICARE RISK AT HOME:  Medicare Risk at Home - 01/08/23 1656     If so, are there any without handrails? No    Elevated toilet seat or a handicapped toilet? Yes             TIMED UP AND GO:  Was the test performed?  No    Cognitive Function:    07/02/2016    1:20 PM  MMSE - Mini Mental State Exam  Orientation to time 5  Orientation to Place 5  Registration 3  Attention/ Calculation 5  Recall 3  Language- name 2 objects 2  Language- repeat 1  Language- follow 3 step command 3  Language- read & follow direction 1  Write a sentence 1  Copy design 1  Total score 30        01/09/2023    2:41 PM 01/02/2022    1:19 PM  6CIT Screen  What Year? 0 points 0 points  What month? 0 points 0 points  What time? 0 points 0 points  Count back from 20 0 points 0 points  Months in reverse 0 points 0 points  Repeat phrase 0 points 0 points  Total Score 0 points 0 points    Immunizations Immunization History  Administered Date(s) Administered   Influenza Inj Mdck Quad Pf 04/04/2017   Influenza Split 04/09/2011, 04/03/2012   Influenza Whole 03/15/2010   Influenza,inj,Quad PF,6+ Mos 04/07/2013, 03/26/2014, 03/22/2015, 04/13/2016, 02/26/2019,  03/06/2022   Influenza-Unspecified 04/04/2017, 04/09/2018, 03/23/2021   PFIZER(Purple Top)SARS-COV-2 Vaccination 09/07/2019, 09/28/2019, 04/11/2020, 10/21/2020   Pfizer Covid-19 Vaccine Bivalent Booster 89yrs & up 04/22/2021   Td 01/23/2007   Tdap 07/04/2017   Unspecified SARS-COV-2 Vaccination 09/07/2019, 09/28/2019, 04/11/2020, 10/21/2020, 04/22/2021   Zoster Recombinant(Shingrix) 08/12/2021, 12/15/2021    TDAP status: Up to date  Flu Vaccine status: Up to date  Pneumococcal vaccine status: Up to date  Covid-19 vaccine status: Declined, Education has been provided regarding the importance of this vaccine but patient still declined. Advised may receive this vaccine at local pharmacy or Health Dept.or vaccine clinic. Aware to provide a copy of the vaccination record if obtained from local pharmacy or Health Dept. Verbalized acceptance and understanding.  Qualifies for Shingles Vaccine? Yes   Zostavax completed No   Shingrix Completed?: Yes  Screening Tests Health Maintenance  Topic Date Due   Colonoscopy  12/10/2019   COVID-19 Vaccine (11 - 2023-24 season) 02/09/2022   Hepatitis C Screening  07/05/2023 (Originally 03/11/1979)   HIV Screening  07/05/2023 (Originally  03/11/1976)   INFLUENZA VACCINE  01/10/2023   MAMMOGRAM  10/31/2023   DEXA SCAN  11/10/2023   Medicare Annual Wellness (AWV)  01/09/2024   DTaP/Tdap/Td (3 - Td or Tdap) 07/05/2027   Zoster Vaccines- Shingrix  Completed   HPV VACCINES  Aged Out    Health Maintenance  Health Maintenance Due  Topic Date Due   Colonoscopy  12/10/2019   COVID-19 Vaccine (11 - 2023-24 season) 02/09/2022    Colorectal cancer screening: Type of screening: Colonoscopy. Completed 12/09/09. Repeat every 10 years Patient declined colonoscopy and cologuard.  Mammogram status: Completed 10/31/22. Repeat every year  Bone Density status: Completed 11/09/21. Results reflect: Bone density results: OSTEOPENIA. Repeat every 2 years.  Lung Cancer  Screening: (Low Dose CT Chest recommended if Age 20-80 years, 20 pack-year currently smoking OR have quit w/in 15years.) does not qualify.   Lung Cancer Screening Referral: n/a  Additional Screening:  Hepatitis C Screening: does qualify; Completed - order placed  Vision Screening: Recommended annual ophthalmology exams for early detection of glaucoma and other disorders of the eye.  Dental Screening: Recommended annual dental exams for proper oral hygiene  Community Resource Referral / Chronic Care Management: CRR required this visit?  No   CCM required this visit?  No     Plan:     I have personally reviewed and noted the following in the patient's chart:   Medical and social history Use of alcohol, tobacco or illicit drugs  Current medications and supplements including opioid prescriptions. Patient is currently taking opioid prescriptions. Information provided to patient regarding non-opioid alternatives. Patient advised to discuss non-opioid treatment plan with their provider. Functional ability and status Nutritional status Physical activity Advanced directives List of other physicians Hospitalizations, surgeries, and ER visits in previous 12 months Vitals Screenings to include cognitive, depression, and falls Referrals and appointments  In addition, I have reviewed and discussed with patient certain preventive protocols, quality metrics, and best practice recommendations. A written personalized care plan for preventive services as well as general preventive health recommendations were provided to patient.     Tora Kindred, CMA   01/09/2023   After Visit Summary: (MyChart) Due to this being a telephonic visit, the after visit summary with patients personalized plan was offered to patient via MyChart   Nurse Notes:  Patient declined colonoscopy or cologuard. Patient declined further covid vaccines. Order placed for Hep C screening to be done with next lab  work.

## 2023-01-10 ENCOUNTER — Other Ambulatory Visit (HOSPITAL_BASED_OUTPATIENT_CLINIC_OR_DEPARTMENT_OTHER): Payer: Self-pay

## 2023-01-10 DIAGNOSIS — Z79891 Long term (current) use of opiate analgesic: Secondary | ICD-10-CM | POA: Diagnosis not present

## 2023-01-10 DIAGNOSIS — M4726 Other spondylosis with radiculopathy, lumbar region: Secondary | ICD-10-CM | POA: Diagnosis not present

## 2023-01-10 DIAGNOSIS — M62838 Other muscle spasm: Secondary | ICD-10-CM | POA: Diagnosis not present

## 2023-01-10 DIAGNOSIS — G894 Chronic pain syndrome: Secondary | ICD-10-CM | POA: Diagnosis not present

## 2023-01-10 MED ORDER — FENTANYL 25 MCG/HR TD PT72
1.0000 | MEDICATED_PATCH | TRANSDERMAL | 0 refills | Status: DC
  Filled 2023-01-10: qty 15, 30d supply, fill #0

## 2023-01-10 MED ORDER — OXYCODONE-ACETAMINOPHEN 10-325 MG PO TABS
1.0000 | ORAL_TABLET | ORAL | 0 refills | Status: DC | PRN
  Filled 2023-01-10: qty 180, 30d supply, fill #0

## 2023-01-15 ENCOUNTER — Other Ambulatory Visit (HOSPITAL_BASED_OUTPATIENT_CLINIC_OR_DEPARTMENT_OTHER): Payer: Self-pay

## 2023-01-17 ENCOUNTER — Other Ambulatory Visit: Payer: Self-pay

## 2023-01-17 ENCOUNTER — Other Ambulatory Visit (HOSPITAL_BASED_OUTPATIENT_CLINIC_OR_DEPARTMENT_OTHER): Payer: Self-pay

## 2023-01-17 MED ORDER — TIZANIDINE HCL 4 MG PO TABS
4.0000 mg | ORAL_TABLET | Freq: Three times a day (TID) | ORAL | 1 refills | Status: DC | PRN
  Filled 2023-01-17: qty 270, 90d supply, fill #0
  Filled 2023-04-16: qty 270, 90d supply, fill #1

## 2023-01-18 ENCOUNTER — Other Ambulatory Visit (HOSPITAL_BASED_OUTPATIENT_CLINIC_OR_DEPARTMENT_OTHER): Payer: Self-pay

## 2023-01-31 DIAGNOSIS — N200 Calculus of kidney: Secondary | ICD-10-CM | POA: Diagnosis not present

## 2023-02-07 ENCOUNTER — Other Ambulatory Visit (HOSPITAL_BASED_OUTPATIENT_CLINIC_OR_DEPARTMENT_OTHER): Payer: Self-pay

## 2023-02-07 DIAGNOSIS — M47814 Spondylosis without myelopathy or radiculopathy, thoracic region: Secondary | ICD-10-CM | POA: Diagnosis not present

## 2023-02-07 DIAGNOSIS — G894 Chronic pain syndrome: Secondary | ICD-10-CM | POA: Diagnosis not present

## 2023-02-07 DIAGNOSIS — M62838 Other muscle spasm: Secondary | ICD-10-CM | POA: Diagnosis not present

## 2023-02-07 DIAGNOSIS — E039 Hypothyroidism, unspecified: Secondary | ICD-10-CM | POA: Diagnosis not present

## 2023-02-07 DIAGNOSIS — Z79891 Long term (current) use of opiate analgesic: Secondary | ICD-10-CM | POA: Diagnosis not present

## 2023-02-07 MED ORDER — FENTANYL 50 MCG/HR TD PT72
1.0000 | MEDICATED_PATCH | TRANSDERMAL | 0 refills | Status: DC
  Filled 2023-02-07: qty 15, 30d supply, fill #0

## 2023-02-07 MED ORDER — OXYCODONE-ACETAMINOPHEN 5-325 MG PO TABS
1.0000 | ORAL_TABLET | ORAL | 0 refills | Status: DC | PRN
  Filled 2023-02-07: qty 180, 30d supply, fill #0

## 2023-02-08 ENCOUNTER — Other Ambulatory Visit (HOSPITAL_BASED_OUTPATIENT_CLINIC_OR_DEPARTMENT_OTHER): Payer: Self-pay

## 2023-02-08 MED ORDER — LEVOTHYROXINE SODIUM 175 MCG PO TABS
175.0000 ug | ORAL_TABLET | Freq: Every morning | ORAL | 11 refills | Status: AC
  Filled 2023-02-08: qty 26, 30d supply, fill #0
  Filled 2023-03-24: qty 26, 30d supply, fill #1
  Filled 2023-04-18: qty 26, 30d supply, fill #2
  Filled 2023-05-31: qty 26, 30d supply, fill #3
  Filled 2023-07-01: qty 26, 30d supply, fill #4
  Filled 2023-07-30: qty 26, 30d supply, fill #5
  Filled 2023-08-27: qty 26, 30d supply, fill #6
  Filled 2023-09-30: qty 26, 30d supply, fill #7
  Filled 2023-10-31: qty 26, 30d supply, fill #8
  Filled 2023-12-07: qty 26, 30d supply, fill #9
  Filled 2024-01-03: qty 26, 30d supply, fill #10

## 2023-02-08 MED ORDER — LEVOTHYROXINE SODIUM 50 MCG PO TABS
50.0000 ug | ORAL_TABLET | Freq: Every morning | ORAL | 5 refills | Status: AC
  Filled 2023-02-08: qty 12, 90d supply, fill #0
  Filled 2023-04-18: qty 12, 90d supply, fill #1
  Filled 2023-07-30: qty 12, 90d supply, fill #2
  Filled 2023-10-31: qty 12, 90d supply, fill #3

## 2023-03-07 ENCOUNTER — Other Ambulatory Visit (HOSPITAL_BASED_OUTPATIENT_CLINIC_OR_DEPARTMENT_OTHER): Payer: Self-pay

## 2023-03-07 DIAGNOSIS — Z79891 Long term (current) use of opiate analgesic: Secondary | ICD-10-CM | POA: Diagnosis not present

## 2023-03-07 DIAGNOSIS — G894 Chronic pain syndrome: Secondary | ICD-10-CM | POA: Diagnosis not present

## 2023-03-07 DIAGNOSIS — M47814 Spondylosis without myelopathy or radiculopathy, thoracic region: Secondary | ICD-10-CM | POA: Diagnosis not present

## 2023-03-07 DIAGNOSIS — M62838 Other muscle spasm: Secondary | ICD-10-CM | POA: Diagnosis not present

## 2023-03-07 MED ORDER — OXYCODONE-ACETAMINOPHEN 5-325 MG PO TABS
1.0000 | ORAL_TABLET | ORAL | 0 refills | Status: DC | PRN
  Filled 2023-03-07 – 2023-03-08 (×2): qty 180, 30d supply, fill #0

## 2023-03-07 MED ORDER — FENTANYL 50 MCG/HR TD PT72
MEDICATED_PATCH | TRANSDERMAL | 0 refills | Status: DC
  Filled 2023-03-07: qty 15, 30d supply, fill #0

## 2023-03-08 ENCOUNTER — Other Ambulatory Visit (HOSPITAL_BASED_OUTPATIENT_CLINIC_OR_DEPARTMENT_OTHER): Payer: Self-pay

## 2023-04-04 ENCOUNTER — Other Ambulatory Visit (HOSPITAL_BASED_OUTPATIENT_CLINIC_OR_DEPARTMENT_OTHER): Payer: Self-pay

## 2023-04-04 DIAGNOSIS — G894 Chronic pain syndrome: Secondary | ICD-10-CM | POA: Diagnosis not present

## 2023-04-04 DIAGNOSIS — Z79891 Long term (current) use of opiate analgesic: Secondary | ICD-10-CM | POA: Diagnosis not present

## 2023-04-04 DIAGNOSIS — M47814 Spondylosis without myelopathy or radiculopathy, thoracic region: Secondary | ICD-10-CM | POA: Diagnosis not present

## 2023-04-04 DIAGNOSIS — E039 Hypothyroidism, unspecified: Secondary | ICD-10-CM | POA: Diagnosis not present

## 2023-04-04 DIAGNOSIS — M62838 Other muscle spasm: Secondary | ICD-10-CM | POA: Diagnosis not present

## 2023-04-04 MED ORDER — OXYCODONE-ACETAMINOPHEN 5-325 MG PO TABS
1.0000 | ORAL_TABLET | ORAL | 0 refills | Status: DC | PRN
  Filled 2023-04-05: qty 180, 30d supply, fill #0

## 2023-04-04 MED ORDER — FENTANYL 50 MCG/HR TD PT72
1.0000 | MEDICATED_PATCH | TRANSDERMAL | 0 refills | Status: DC
  Filled 2023-04-05: qty 15, 30d supply, fill #0
  Filled ????-??-??: fill #0

## 2023-04-05 ENCOUNTER — Other Ambulatory Visit (HOSPITAL_BASED_OUTPATIENT_CLINIC_OR_DEPARTMENT_OTHER): Payer: Self-pay

## 2023-04-18 ENCOUNTER — Other Ambulatory Visit (HOSPITAL_BASED_OUTPATIENT_CLINIC_OR_DEPARTMENT_OTHER): Payer: Self-pay

## 2023-05-02 ENCOUNTER — Other Ambulatory Visit (HOSPITAL_BASED_OUTPATIENT_CLINIC_OR_DEPARTMENT_OTHER): Payer: Self-pay

## 2023-05-02 DIAGNOSIS — G894 Chronic pain syndrome: Secondary | ICD-10-CM | POA: Diagnosis not present

## 2023-05-02 DIAGNOSIS — M62838 Other muscle spasm: Secondary | ICD-10-CM | POA: Diagnosis not present

## 2023-05-02 DIAGNOSIS — Z79891 Long term (current) use of opiate analgesic: Secondary | ICD-10-CM | POA: Diagnosis not present

## 2023-05-02 DIAGNOSIS — M47814 Spondylosis without myelopathy or radiculopathy, thoracic region: Secondary | ICD-10-CM | POA: Diagnosis not present

## 2023-05-02 MED ORDER — OXYCODONE-ACETAMINOPHEN 5-325 MG PO TABS
1.0000 | ORAL_TABLET | Freq: Every day | ORAL | 0 refills | Status: DC
  Filled 2023-05-06: qty 150, 30d supply, fill #0

## 2023-05-02 MED ORDER — FENTANYL 50 MCG/HR TD PT72
1.0000 | MEDICATED_PATCH | TRANSDERMAL | 0 refills | Status: DC
Start: 2023-05-02 — End: 2023-05-31
  Filled 2023-05-06: qty 15, 30d supply, fill #0

## 2023-05-06 ENCOUNTER — Other Ambulatory Visit (HOSPITAL_BASED_OUTPATIENT_CLINIC_OR_DEPARTMENT_OTHER): Payer: Self-pay

## 2023-05-16 ENCOUNTER — Ambulatory Visit: Payer: Medicare Other | Admitting: Family Medicine

## 2023-05-22 ENCOUNTER — Other Ambulatory Visit (HOSPITAL_BASED_OUTPATIENT_CLINIC_OR_DEPARTMENT_OTHER): Payer: Self-pay

## 2023-05-22 ENCOUNTER — Telehealth: Payer: Medicare Other | Admitting: Medical

## 2023-05-22 ENCOUNTER — Ambulatory Visit (HOSPITAL_BASED_OUTPATIENT_CLINIC_OR_DEPARTMENT_OTHER)
Admission: RE | Admit: 2023-05-22 | Discharge: 2023-05-22 | Disposition: A | Discharge status: Home / Self Care | Payer: Medicare Other | Source: Ambulatory Visit | Attending: Medical | Admitting: Medical

## 2023-05-22 ENCOUNTER — Encounter: Payer: Self-pay | Admitting: Medical

## 2023-05-22 VITALS — HR 70

## 2023-05-22 DIAGNOSIS — B349 Viral infection, unspecified: Secondary | ICD-10-CM | POA: Diagnosis not present

## 2023-05-22 DIAGNOSIS — U071 COVID-19: Secondary | ICD-10-CM

## 2023-05-22 DIAGNOSIS — R509 Fever, unspecified: Secondary | ICD-10-CM | POA: Diagnosis not present

## 2023-05-22 DIAGNOSIS — R059 Cough, unspecified: Secondary | ICD-10-CM | POA: Insufficient documentation

## 2023-05-22 MED ORDER — BENZONATATE 100 MG PO CAPS
100.0000 mg | ORAL_CAPSULE | Freq: Three times a day (TID) | ORAL | 0 refills | Status: DC | PRN
  Filled 2023-05-22: qty 30, 10d supply, fill #0

## 2023-05-22 MED ORDER — FLUTICASONE PROPIONATE 50 MCG/ACT NA SUSP
2.0000 | Freq: Every day | NASAL | 1 refills | Status: AC
  Filled 2023-05-22: qty 16, 30d supply, fill #0

## 2023-05-22 MED ORDER — ALBUTEROL SULFATE HFA 108 (90 BASE) MCG/ACT IN AERS
2.0000 | INHALATION_SPRAY | Freq: Four times a day (QID) | RESPIRATORY_TRACT | 0 refills | Status: DC | PRN
  Filled 2023-05-22: qty 18, 25d supply, fill #0

## 2023-05-22 NOTE — Progress Notes (Signed)
Subjective:    Patient ID: Karen Brown, female    DOB: 01-Aug-1960, 62 y.o.   MRN: [id number]  HPI Virtual Visit via Video Note  I connected with Karen Brown on 05/22/23 at  9:20 AM EST by a video enabled telemedicine application and verified that I am speaking with the correct person using two identifiers.  Location: Patient: home Provider: office   I discussed the limitations of evaluation and management by telemedicine and the availability of in person appointments. The patient expressed understanding and agreed to proceed.  History of Present Illness: Discussed the use of AI scribe software for clinical note transcription with the patient, who gave verbal consent to proceed.  History of Present Illness   The patient, with a history of chronic back pain managed with oxycodone, tested positive for COVID-19 four days ago. This is her first experience with the virus. The initial symptom was a bout of diarrhea on saturday, which she attributed to dietary factors. The following day, she experienced generalized body aches, cough, and a fever of 101 degrees Fahrenheit, which was significantly higher than her usual temperature. The cough was initially dry but became productive with the expulsion of a large chunk of mucus.  The patient also reported chest tightness, describing it as a feeling of constriction in her breathing capacity. She describes rattling sound she heard during breathing was from one or both sides of the chest. The patient's oxygen saturation fluctuated between 94% and 98% during the conversation.  The patient has a history of frequent bronchitis episodes, and she noted that her current symptoms often precede a bronchitis episode. She has been managing her cough with plain Mucinex, as she cannot take the DM variant due to an interaction with her antidepressant. She also reported nasal congestion, which has made it difficult for her to breathe through her nose. The cough has  worsened, particularly at night, disrupting her sleep.  The patient's kidney function is noted to be suboptimal, with a GFR of 48. She expressed concerns about potential interactions between her current medications and any new treatments, as she has a history of adverse reactions to various classes of medications. She has been vaccinated against COVID-19 but did not receive the most recent dose.                 Observations/Objective:  General-no acute distress, pleasant, oriented. Lungs- on inspection lungs appear unlabored. Neck- no tracheal deviation or jvd on inspection. Neuro- gross motor function appears intact.   Assessment and Plan: Assessment and Plan    COVID-19 Positive test with symptoms starting 4 days ago. Symptoms include fever, body aches, cough, and chest tightness. Oxygen saturation is 97% on room air. Patient declined Paxlovid due to potential interaction with oxycodone. -Order chest x-ray to rule out pneumonia. -Continue monitoring symptoms and oxygen saturation at home. -Consider antibiotics if symptoms worsen or persist, suggesting possible secondary bacterial infection. -benzonatate for cough. -flonase for nasal congestion  Chronic Pain Patient is on oxycodone 5-325mg  five times daily for chronic pain management. -Continue current regimen.  Nasal Congestion Likely related to COVID-19 infection. -Prescribe Flonase for nasal congestion.  Cough Likely related to COVID-19 infection. -Prescribe benzonatate for cough suppression.  Follow-up Monitor symptoms and oxygen saturation at home. Notify the office if symptoms worsen or persist. Follow-up after chest x-ray results are available.        Esperanza Richters, PA-C   Follow Up Instructions:    I discussed the assessment and treatment  plan with the patient. The patient was provided an opportunity to ask questions and all were answered. The patient agreed with the plan and demonstrated an understanding of  the instructions.   The patient was advised to call back or seek an in-person evaluation if the symptoms worsen or if the condition fails to improve as anticipated.     Esperanza Richters, PA-C    Review of Systems     Objective:   Physical Exam        Assessment & Plan:

## 2023-05-22 NOTE — Patient Instructions (Signed)
COVID-19 Positive test with symptoms starting 4 days ago. Symptoms include fever, body aches, cough, and chest tightness. Oxygen saturation is 97% on room air. Patient declined Paxlovid due to potential interaction with oxycodone.(Long discussion benefits vs risk of taking and not taking med discussed) -Order chest x-ray to rule out pneumonia. -Continue monitoring symptoms and oxygen saturation at home. -Consider antibiotics if symptoms worsen or persist, suggesting possible secondary bacterial infection. -benzonatate for cough. -flonase for nasal congestion  Chronic Pain Patient is on oxycodone 5-325mg  five times daily for chronic pain management. -Continue current regimen.  Nasal Congestion Likely related to COVID-19 infection. -Prescribe Flonase for nasal congestion.  Cough Likely related to COVID-19 infection. -Prescribe benzonatate for cough suppression.  Follow-up Monitor symptoms and oxygen saturation at home. Notify the office if symptoms worsen or persist. Follow-up after chest x-ray results are available.

## 2023-05-22 NOTE — Addendum Note (Signed)
Addended by: Gwenevere Abbot on: 05/22/2023 01:14 PM   Modules accepted: Orders

## 2023-05-23 ENCOUNTER — Telehealth: Payer: Self-pay | Admitting: Family Medicine

## 2023-05-23 NOTE — Telephone Encounter (Signed)
Pt called asking for a general timeframe as to when to follow up if she needs alternative treatment for her COVID Symptoms. Please Advise.

## 2023-05-24 NOTE — Telephone Encounter (Signed)
Pt called made aware of note below, offered an appt for next week , she stated she wasn't near her schedule and would call us back

## 2023-05-31 ENCOUNTER — Other Ambulatory Visit: Payer: Self-pay

## 2023-05-31 ENCOUNTER — Other Ambulatory Visit (HOSPITAL_BASED_OUTPATIENT_CLINIC_OR_DEPARTMENT_OTHER): Payer: Self-pay

## 2023-05-31 ENCOUNTER — Encounter: Payer: Self-pay | Admitting: Family Medicine

## 2023-05-31 ENCOUNTER — Ambulatory Visit (INDEPENDENT_AMBULATORY_CARE_PROVIDER_SITE_OTHER): Payer: Medicare Other | Admitting: Family Medicine

## 2023-05-31 VITALS — BP 160/98 | HR 68 | Temp 99.1°F | Resp 18 | Ht 65.0 in | Wt 218.4 lb

## 2023-05-31 DIAGNOSIS — H6691 Otitis media, unspecified, right ear: Secondary | ICD-10-CM | POA: Diagnosis not present

## 2023-05-31 MED ORDER — FENTANYL 50 MCG/HR TD PT72
1.0000 | MEDICATED_PATCH | TRANSDERMAL | 0 refills | Status: DC
  Filled 2023-05-31 – 2023-06-03 (×2): qty 15, 30d supply, fill #0

## 2023-05-31 MED ORDER — OXYCODONE-ACETAMINOPHEN 5-325 MG PO TABS
1.0000 | ORAL_TABLET | Freq: Every day | ORAL | 0 refills | Status: DC | PRN
  Filled 2023-05-31: qty 150, 32d supply, fill #0
  Filled 2023-06-03: qty 150, 30d supply, fill #0

## 2023-05-31 MED ORDER — AZITHROMYCIN 250 MG PO TABS
ORAL_TABLET | ORAL | 0 refills | Status: AC
  Filled 2023-05-31: qty 6, 5d supply, fill #0

## 2023-05-31 NOTE — Progress Notes (Signed)
Established Patient Office Visit  Subjective   Patient ID: Karen Brown, female    DOB: 05-Apr-1961  Age: 62 y.o. MRN: [id number]  Chief Complaint  Patient presents with   Covid Positive    Pt states still having sxs. Pt states right ear feels clogged   Follow-up    HPI Discussed the use of AI scribe software for clinical note transcription with the patient, who gave verbal consent to proceed.  History of Present Illness   The patient, with a recent diagnosis of COVID-19, presents with symptoms that began approximately two weeks ago. She initially experienced a high fever of over 101 degrees, which is unusual for her. The patient describes the illness as akin to a severe sinus infection, strep throat, and bronchitis combined. She also reports a secondary infection in the ear, which was confirmed during a video visit with another healthcare provider.  The patient's symptoms have significantly impacted her emotional well-being, leading to increased sensitivity and tearfulness. Despite this, she reports feeling better on the day of the consultation. She has been managing her symptoms with Mucinex and Flonase, and she also takes an antidepressant, Auvelity.  The patient has a history of being stubborn about her health, often resisting treatments such as inhalers. She has not picked up a prescribed albuterol inhaler, expressing a dislike for this type of medication.  The patient's recent illness has not deterred her from social activities, as she attended a family Christmas party where several relatives also fell ill. She also has plans to travel to [place] in the summer.  The patient's blood pressure was noted to be high during the consultation, which she attributes to the stress and fatigue from her illness.      Patient Active Problem List   Diagnosis Date Noted   Diarrhea 09/27/2022   Closed fracture of first lumbar vertebra (HCC) 01/23/2022   Tinea cruris 01/23/2022   Estrogen  deficiency 11/02/2021   Nausea without vomiting 11/02/2021   Irritable bowel syndrome 06/30/2021   Right flank pain 02/17/2019   Preventative health care 07/02/2016   Obesity (BMI 30-39.9) 02/25/2013   Migraine 07/07/2009   NEVI, MULTIPLE 02/07/2009   PREMATURE VENTRICULAR CONTRACTIONS 02/10/2008   DIVERTICULOSIS OF COLON 02/10/2008   ENDOMETRIOSIS, SITE UNSPECIFIED 02/10/2008   BACK PAIN, LUMBAR 02/06/2008   MURMUR 07/08/2007   Primary hypertension 05/30/2007   Hypothyroidism 07/29/2006   DEPRESSION 07/29/2006   ROSACEA 07/29/2006   FIBROMYALGIA 07/29/2006   HEADACHE 07/29/2006   Personal history of other diseases of digestive system 07/29/2006   HX, PERSONAL, URINARY CALCULI 07/29/2006   Past Medical History:  Diagnosis Date   Anxiety    Bipolar 1 disorder (HCC)    Chronic migraine    Chronically dry eyes    Colitis, ischemic (HCC)    Complication of anesthesia    versed does not work as stated per pt    Depression    Diverticulosis    Dry mouth    Family history of adverse reaction to anesthesia    Fibromyalgia    GERD (gastroesophageal reflux disease)    Hemorrhoids    Hyperlipemia    Hypertension    Hypothyroidism    IBS (irritable bowel syndrome)    PVC (premature ventricular contraction)    Past Surgical History:  Procedure Laterality Date   ABDOMINAL HYSTERECTOMY     Partial    APPENDECTOMY     BACK SURGERY     Micro diskectomy   BACK SURGERY  L facet rhzotomy   CHOLECYSTECTOMY     EXTRACORPOREAL SHOCK WAVE LITHOTRIPSY Left 03/19/2019   Procedure: EXTRACORPOREAL SHOCK WAVE LITHOTRIPSY (ESWL);  Surgeon: Rene Paci, MD;  Location: [facility];  Service: Urology;  Laterality: Left;   TONSILLECTOMY     Social History   Tobacco Use   Smoking status: Never   Smokeless tobacco: Never  Vaping Use   Vaping status: Never Used  Substance Use Topics   Alcohol use: No   Drug use: No   Social History   Socioeconomic History   Marital  status: Married    Spouse name: Acupuncturist   Number of children: 0   Years of education: Not on file   Highest education level: Not on file  Occupational History   Occupation: Disabled     Employer: [employer]  Tobacco Use   Smoking status: Never   Smokeless tobacco: Never  Vaping Use   Vaping status: Never Used  Substance and Sexual Activity   Alcohol use: No   Drug use: No   Sexual activity: Yes  Other Topics Concern   Not on file  Social History Narrative   Married, disabled, unable to have children   Social Drivers of Corporate investment banker Strain: Low Risk  (01/08/2023)   Overall Financial Resource Strain (CARDIA)    Difficulty of Paying Living Expenses: Not hard at all  Food Insecurity: No Food Insecurity (01/08/2023)   Hunger Vital Sign    Worried About Running Out of Food in the Last Year: Never true    Ran Out of Food in the Last Year: Never true  Transportation Needs: No Transportation Needs (01/08/2023)   PRAPARE - Administrator, Civil Service (Medical): No    Lack of Transportation (Non-Medical): No  Physical Activity: Inactive (01/08/2023)   Exercise Vital Sign    Days of Exercise per Week: 0 days    Minutes of Exercise per Session: 0 min  Stress: Stress Concern Present (01/08/2023)   [employer] of Occupational Health - Occupational Stress Questionnaire    Feeling of Stress : Rather much  Social Connections: Moderately Integrated (01/08/2023)   Social Connection and Isolation Panel [NHANES]    Frequency of Communication with Friends and Family: More than three times a week    Frequency of Social Gatherings with Friends and Family: Once a week    Attends Religious Services: Never    Database administrator or Organizations: Yes    Attends Banker Meetings: Never    Marital Status: Married  Catering manager Violence: Not At Risk (01/09/2023)   Humiliation, Afraid, Rape, and Kick questionnaire    Fear of Current or Ex-Partner: No     Emotionally Abused: No    Physically Abused: No    Sexually Abused: No   Family Status  Relation Name Status   Father  Deceased at age 31       copd   Mother  Alive   Neg Hx  (Not Specified)  No partnership data on file   Family History  Problem Relation Age of Onset   Kidney disease Father    Coronary artery disease Father 36   COPD Father 36       copd   Heart disease Father    Colon polyps Mother    Osteoporosis Mother    Colon cancer Neg Hx    Allergies  Allergen Reactions   Other Other (See Comments)    Oral steroids; skin  sensitivity   Sulindac Diarrhea and Other (See Comments)   Biaxin [Clarithromycin] Other (See Comments)    Bad taste and ringing in ear with dec hearing   Calcium-Containing Compounds Diarrhea   Clindamycin/Lincomycin Dermatitis   Ketorolac Tromethamine Other (See Comments)    migraine   Metoprolol Tartrate Other (See Comments)    syncope   Pb-Hyoscy-Atropine-Scopolamine Hives   Penicillins     REACTION: HIVES   Phenobarbital Hives   Sulfonamide Derivatives    Topiramate Other (See Comments)    Kidney stones   Vortioxetine Diarrhea and Other (See Comments)   Zaleplon Other (See Comments)    Muscle weakness      Review of Systems  Constitutional:  Negative for fever and malaise/fatigue.  HENT:  Positive for congestion, ear pain and sinus pain.   Eyes:  Negative for blurred vision.  Respiratory:  Negative for cough and shortness of breath.   Cardiovascular:  Negative for chest pain, palpitations and leg swelling.  Gastrointestinal:  Negative for vomiting.  Musculoskeletal:  Negative for back pain.  Skin:  Negative for rash.  Neurological:  Negative for loss of consciousness and headaches.      Objective:     BP (!) 160/98 (BP Location: Left Arm, Patient Position: Sitting, Cuff Size: Large)   Pulse 68   Temp 99.1 F (37.3 C) (Oral)   Resp 18   Ht 5\' 5"  (1.651 m)   Wt 218 lb 6.4 oz (99.1 kg)   SpO2 96%   BMI 36.34 kg/m   BP Readings from Last 3 Encounters:  05/31/23 (!) 160/98  11/08/22 (!) 138/90  09/27/22 128/76   Wt Readings from Last 3 Encounters:  05/31/23 218 lb 6.4 oz (99.1 kg)  01/09/23 213 lb (96.6 kg)  11/08/22 213 lb 3.2 oz (96.7 kg)   SpO2 Readings from Last 3 Encounters:  05/31/23 96%  05/22/23 97%  11/08/22 96%      Physical Exam Vitals and nursing note reviewed.  Constitutional:      General: She is not in acute distress.    Appearance: Normal appearance. She is well-developed.  HENT:     Head: Normocephalic and atraumatic.     Nose:     Right Sinus: Frontal sinus tenderness present.     Left Sinus: Frontal sinus tenderness present.  Eyes:     General: No scleral icterus.       Right eye: No discharge.        Left eye: No discharge.  Cardiovascular:     Rate and Rhythm: Normal rate and regular rhythm.     Heart sounds: No murmur heard. Pulmonary:     Effort: Pulmonary effort is normal. No respiratory distress.     Breath sounds: Normal breath sounds.  Musculoskeletal:        General: Normal range of motion.     Cervical back: Normal range of motion and neck supple.     Right lower leg: No edema.     Left lower leg: No edema.  Lymphadenopathy:     Cervical: No cervical adenopathy.  Skin:    General: Skin is warm and dry.  Neurological:     Mental Status: She is alert and oriented to person, place, and time.  Psychiatric:        Mood and Affect: Mood normal.        Behavior: Behavior normal.        Thought Content: Thought content normal.  Judgment: Judgment normal.      No results found for any visits on 05/31/23.  Last CBC Lab Results  Component Value Date   WBC 9.3 11/08/2022   HGB 14.7 11/08/2022   HCT 45.2 11/08/2022   MCV 86.6 11/08/2022   RDW 12.9 11/08/2022   PLT 326.0 11/08/2022   Last metabolic panel Lab Results  Component Value Date   GLUCOSE 93 11/08/2022   NA 139 11/08/2022   K 4.4 11/08/2022   CL 99 11/08/2022   CO2 30  11/08/2022   BUN 19 11/08/2022   CREATININE 1.19 11/08/2022   GFR 48.61 (L) 11/08/2022   CALCIUM 9.9 11/08/2022   PROT 7.6 11/08/2022   ALBUMIN 4.5 11/08/2022   BILITOT 0.3 11/08/2022   ALKPHOS 115 11/08/2022   AST 21 11/08/2022   ALT 20 11/08/2022   Last lipids Lab Results  Component Value Date   CHOL 306 (H) 11/08/2022   HDL 49.80 11/08/2022   LDLCALC 190 (H) 06/27/2015   LDLDIRECT 213.0 11/08/2022   TRIG 339.0 (H) 11/08/2022   CHOLHDL 6 11/08/2022   Last hemoglobin A1c Lab Results  Component Value Date   HGBA1C 5.4 08/15/2007   Last thyroid functions Lab Results  Component Value Date   TSH 0.34 (L) 11/08/2022   Last vitamin D Lab Results  Component Value Date   VD25OH 23 (L) 02/10/2010   Last vitamin B12 and Folate Lab Results  Component Value Date   VITAMINB12 544 02/07/2009   FOLATE 11.4 02/07/2009      The 10-year ASCVD risk score (Arnett DK, et al., 2019) is: 13.7%    Assessment & Plan:   Problem List Items Addressed This Visit   None Visit Diagnoses       Right otitis media, unspecified otitis media type    -  Primary   Relevant Medications   azithromycin (ZITHROMAX Z-PAK) 250 MG tablet     Assessment and Plan    COVID-19 Tested positive for COVID-19 on May 21, 2023, with symptoms starting May 20, 2023, including fever >101F, sore throat, anosmia, ageusia, and fatigue. Symptoms have improved, but a secondary ear infection was noted. She is no longer contagious, has not taken Paxlovid, nor picked up the albuterol inhaler. We discussed the importance of receiving the latest COVID-19 vaccine, which she has not yet received. We will prescribe Z-Pak (azithromycin) for 5 days, recommend using Flonase as needed, taking an antihistamine to reduce drainage, and continuing Mucinex to thin mucus. She should monitor symptoms and return if not improving or worsening. Improvement should be noted by Monday; otherwise, she should  return.  Hypertension Elevated blood pressure was noted, likely due to prolonged illness and fatigue. She was advised to avoid Sudafed due to its potential to increase blood pressure. Blood pressure should be monitored.  General Health Maintenance The importance of receiving the latest COVID-19 vaccine was discussed.  Follow-up She should return for a follow-up if symptoms do not improve by Monday.        No follow-ups on file.    Donato Schultz, DO

## 2023-06-03 ENCOUNTER — Other Ambulatory Visit (HOSPITAL_BASED_OUTPATIENT_CLINIC_OR_DEPARTMENT_OTHER): Payer: Self-pay

## 2023-06-27 ENCOUNTER — Other Ambulatory Visit (HOSPITAL_BASED_OUTPATIENT_CLINIC_OR_DEPARTMENT_OTHER): Payer: Self-pay

## 2023-06-27 DIAGNOSIS — M47814 Spondylosis without myelopathy or radiculopathy, thoracic region: Secondary | ICD-10-CM | POA: Diagnosis not present

## 2023-06-27 DIAGNOSIS — M62838 Other muscle spasm: Secondary | ICD-10-CM | POA: Diagnosis not present

## 2023-06-27 DIAGNOSIS — Z79891 Long term (current) use of opiate analgesic: Secondary | ICD-10-CM | POA: Diagnosis not present

## 2023-06-27 DIAGNOSIS — G894 Chronic pain syndrome: Secondary | ICD-10-CM | POA: Diagnosis not present

## 2023-06-27 MED ORDER — OXYCODONE-ACETAMINOPHEN 5-325 MG PO TABS
1.0000 | ORAL_TABLET | Freq: Every day | ORAL | 0 refills | Status: DC | PRN
  Filled 2023-07-01: qty 150, 30d supply, fill #0

## 2023-06-27 MED ORDER — BUTALBITAL-APAP-CAFFEINE 50-325-40 MG PO TABS
1.0000 | ORAL_TABLET | Freq: Three times a day (TID) | ORAL | 2 refills | Status: AC | PRN
  Filled 2023-06-27: qty 30, 10d supply, fill #0
  Filled 2023-09-19: qty 30, 10d supply, fill #1
  Filled 2023-12-07: qty 30, 10d supply, fill #2

## 2023-06-27 MED ORDER — FENTANYL 50 MCG/HR TD PT72
1.0000 | MEDICATED_PATCH | TRANSDERMAL | 0 refills | Status: DC
  Filled 2023-07-01: qty 15, 30d supply, fill #0

## 2023-07-01 ENCOUNTER — Other Ambulatory Visit (HOSPITAL_BASED_OUTPATIENT_CLINIC_OR_DEPARTMENT_OTHER): Payer: Self-pay

## 2023-07-14 ENCOUNTER — Other Ambulatory Visit (HOSPITAL_BASED_OUTPATIENT_CLINIC_OR_DEPARTMENT_OTHER): Payer: Self-pay

## 2023-07-19 ENCOUNTER — Other Ambulatory Visit (HOSPITAL_BASED_OUTPATIENT_CLINIC_OR_DEPARTMENT_OTHER): Payer: Self-pay

## 2023-07-22 ENCOUNTER — Other Ambulatory Visit (HOSPITAL_BASED_OUTPATIENT_CLINIC_OR_DEPARTMENT_OTHER): Payer: Self-pay

## 2023-07-22 MED ORDER — TIZANIDINE HCL 4 MG PO TABS
4.0000 mg | ORAL_TABLET | Freq: Three times a day (TID) | ORAL | 1 refills | Status: DC | PRN
Start: 2023-07-22 — End: 2023-12-18
  Filled 2023-07-22: qty 270, 90d supply, fill #0
  Filled 2023-10-17: qty 270, 90d supply, fill #1

## 2023-07-23 ENCOUNTER — Other Ambulatory Visit (HOSPITAL_COMMUNITY)
Admission: RE | Admit: 2023-07-23 | Discharge: 2023-07-23 | Disposition: A | Discharge status: Home / Self Care | Payer: Medicare Other | Source: Ambulatory Visit | Attending: Family Medicine | Admitting: Family Medicine

## 2023-07-23 ENCOUNTER — Ambulatory Visit (INDEPENDENT_AMBULATORY_CARE_PROVIDER_SITE_OTHER): Payer: Medicare Other | Admitting: Family Medicine

## 2023-07-23 ENCOUNTER — Other Ambulatory Visit (HOSPITAL_BASED_OUTPATIENT_CLINIC_OR_DEPARTMENT_OTHER): Payer: Self-pay

## 2023-07-23 VITALS — BP 150/90 | HR 82 | Temp 98.7°F | Resp 18 | Ht 65.0 in | Wt 218.4 lb

## 2023-07-23 DIAGNOSIS — R300 Dysuria: Secondary | ICD-10-CM

## 2023-07-23 DIAGNOSIS — R82998 Other abnormal findings in urine: Secondary | ICD-10-CM | POA: Diagnosis not present

## 2023-07-23 DIAGNOSIS — N898 Other specified noninflammatory disorders of vagina: Secondary | ICD-10-CM | POA: Diagnosis present

## 2023-07-23 LAB — POC URINALSYSI DIPSTICK (AUTOMATED)
Bilirubin, UA: NEGATIVE
Blood, UA: NEGATIVE
Glucose, UA: NEGATIVE
Ketones, UA: NEGATIVE
Nitrite, UA: NEGATIVE
Protein, UA: NEGATIVE
Spec Grav, UA: <=1.005 — AB (ref 1.010–1.025)
Urobilinogen, UA: 0.2 U/dL
pH, UA: 6 (ref 5.0–8.0)

## 2023-07-23 MED ORDER — FLUCONAZOLE 150 MG PO TABS
150.0000 mg | ORAL_TABLET | Freq: Every day | ORAL | 0 refills | Status: DC
  Filled 2023-07-23: qty 2, 2d supply, fill #0

## 2023-07-24 ENCOUNTER — Encounter: Payer: Self-pay | Admitting: Family Medicine

## 2023-07-24 ENCOUNTER — Other Ambulatory Visit (HOSPITAL_BASED_OUTPATIENT_CLINIC_OR_DEPARTMENT_OTHER): Payer: Self-pay

## 2023-07-24 DIAGNOSIS — R300 Dysuria: Secondary | ICD-10-CM | POA: Insufficient documentation

## 2023-07-24 DIAGNOSIS — Z79891 Long term (current) use of opiate analgesic: Secondary | ICD-10-CM | POA: Diagnosis not present

## 2023-07-24 DIAGNOSIS — G894 Chronic pain syndrome: Secondary | ICD-10-CM | POA: Diagnosis not present

## 2023-07-24 DIAGNOSIS — M62838 Other muscle spasm: Secondary | ICD-10-CM | POA: Diagnosis not present

## 2023-07-24 DIAGNOSIS — M47814 Spondylosis without myelopathy or radiculopathy, thoracic region: Secondary | ICD-10-CM | POA: Diagnosis not present

## 2023-07-24 DIAGNOSIS — N898 Other specified noninflammatory disorders of vagina: Secondary | ICD-10-CM | POA: Insufficient documentation

## 2023-07-24 LAB — URINE CULTURE
MICRO NUMBER:: 16069614
Result:: NO GROWTH
SPECIMEN QUALITY:: ADEQUATE

## 2023-07-24 MED ORDER — FENTANYL 50 MCG/HR TD PT72
1.0000 | MEDICATED_PATCH | TRANSDERMAL | 0 refills | Status: DC
Start: 2023-07-30 — End: 2024-01-14
  Filled 2023-07-30: qty 15, 30d supply, fill #0

## 2023-07-24 MED ORDER — OXYCODONE-ACETAMINOPHEN 5-325 MG PO TABS
1.0000 | ORAL_TABLET | Freq: Every day | ORAL | 0 refills | Status: DC | PRN
Start: 2023-07-30 — End: 2024-01-15
  Filled 2023-07-30: qty 150, 30d supply, fill #0

## 2023-07-24 NOTE — Assessment & Plan Note (Signed)
Self swab sent out Urine culture pending

## 2023-07-24 NOTE — Patient Instructions (Signed)

## 2023-07-24 NOTE — Progress Notes (Signed)
Established Patient Office Visit  Subjective   Patient ID: Karen Brown, female    DOB: March 29, 1961  Age: 63 y.o. MRN: [id number]  Chief Complaint  Patient presents with   Vaginal Itching    Sxs started Saturday night, pt states having itching, burning, and swelling     HPI Discussed the use of AI scribe software for clinical note transcription with the patient, who gave verbal consent to proceed.  History of Present Illness   Karen Brown is a 63 year old female who presents with vaginal discomfort and possible yeast infection.  She experiences vaginal discomfort that began with a general feeling of discomfort and progressed to feeling raw and swollen by Sunday. There is a small amount of blood from the labia on toilet paper. She has not had a yeast infection in approximately 30 years and is unsure if this is the current issue. No burning during urination, except when urine contacts the raw area, and no increased frequency of urination. She has not attempted any over-the-counter treatments and is seeking a prescription for a medication that resolves the issue overnight.  She has a history of vaginal dryness and has previously used estradiol cream prescribed by her OB-GYN, Dr. Legrand Pitts. She started to see results but discontinued use after a fall. She has not had sexual intercourse in three years due to dryness and discomfort.  She experienced sudden onset diarrhea, likely related to food consumption from a [place] [employer]. She does not report any other symptoms suggestive of a viral infection such as COVID-19.      Patient Active Problem List   Diagnosis Date Noted   Vaginal discharge 07/24/2023   Dysuria 07/24/2023   Diarrhea 09/27/2022   Closed fracture of first lumbar vertebra (HCC) 01/23/2022   Tinea cruris 01/23/2022   Estrogen deficiency 11/02/2021   Nausea without vomiting 11/02/2021   Irritable bowel syndrome 06/30/2021   Right flank pain 02/17/2019   Preventative  health care 07/02/2016   Obesity (BMI 30-39.9) 02/25/2013   Migraine 07/07/2009   NEVI, MULTIPLE 02/07/2009   PREMATURE VENTRICULAR CONTRACTIONS 02/10/2008   DIVERTICULOSIS OF COLON 02/10/2008   ENDOMETRIOSIS, SITE UNSPECIFIED 02/10/2008   BACK PAIN, LUMBAR 02/06/2008   MURMUR 07/08/2007   Primary hypertension 05/30/2007   Hypothyroidism 07/29/2006   DEPRESSION 07/29/2006   ROSACEA 07/29/2006   FIBROMYALGIA 07/29/2006   HEADACHE 07/29/2006   Personal history of other diseases of digestive system 07/29/2006   HX, PERSONAL, URINARY CALCULI 07/29/2006   Past Medical History:  Diagnosis Date   Anxiety    Bipolar 1 disorder (HCC)    Chronic migraine    Chronically dry eyes    Colitis, ischemic (HCC)    Complication of anesthesia    versed does not work as stated per pt    Depression    Diverticulosis    Dry mouth    Family history of adverse reaction to anesthesia    Fibromyalgia    GERD (gastroesophageal reflux disease)    Hemorrhoids    Hyperlipemia    Hypertension    Hypothyroidism    IBS (irritable bowel syndrome)    PVC (premature ventricular contraction)    Past Surgical History:  Procedure Laterality Date   ABDOMINAL HYSTERECTOMY     Partial    APPENDECTOMY     BACK SURGERY     Micro diskectomy   BACK SURGERY     L facet rhzotomy   CHOLECYSTECTOMY     EXTRACORPOREAL SHOCK WAVE LITHOTRIPSY Left  03/19/2019   Procedure: EXTRACORPOREAL SHOCK WAVE LITHOTRIPSY (ESWL);  Surgeon: Rene Paci, MD;  Location: [facility];  Service: Urology;  Laterality: Left;   TONSILLECTOMY     Social History   Tobacco Use   Smoking status: Never   Smokeless tobacco: Never  Vaping Use   Vaping status: Never Used  Substance Use Topics   Alcohol use: No   Drug use: No   Social History   Socioeconomic History   Marital status: Married    Spouse name: Acupuncturist   Number of children: 0   Years of education: Not on file   Highest education level: Not on file   Occupational History   Occupation: Disabled     Employer: [employer]  Tobacco Use   Smoking status: Never   Smokeless tobacco: Never  Vaping Use   Vaping status: Never Used  Substance and Sexual Activity   Alcohol use: No   Drug use: No   Sexual activity: Yes  Other Topics Concern   Not on file  Social History Narrative   Married, disabled, unable to have children   Social Drivers of Corporate investment banker Strain: Low Risk  (01/08/2023)   Overall Financial Resource Strain (CARDIA)    Difficulty of Paying Living Expenses: Not hard at all  Food Insecurity: No Food Insecurity (01/08/2023)   Hunger Vital Sign    Worried About Running Out of Food in the Last Year: Never true    Ran Out of Food in the Last Year: Never true  Transportation Needs: No Transportation Needs (01/08/2023)   PRAPARE - Administrator, Civil Service (Medical): No    Lack of Transportation (Non-Medical): No  Physical Activity: Inactive (01/08/2023)   Exercise Vital Sign    Days of Exercise per Week: 0 days    Minutes of Exercise per Session: 0 min  Stress: Stress Concern Present (01/08/2023)   [employer] of Occupational Health - Occupational Stress Questionnaire    Feeling of Stress : Rather much  Social Connections: Moderately Integrated (01/08/2023)   Social Connection and Isolation Panel [NHANES]    Frequency of Communication with Friends and Family: More than three times a week    Frequency of Social Gatherings with Friends and Family: Once a week    Attends Religious Services: Never    Database administrator or Organizations: Yes    Attends Banker Meetings: Never    Marital Status: Married  Catering manager Violence: Not At Risk (01/09/2023)   Humiliation, Afraid, Rape, and Kick questionnaire    Fear of Current or Ex-Partner: No    Emotionally Abused: No    Physically Abused: No    Sexually Abused: No   Family Status  Relation Name Status   Father  Deceased at  age 46       copd   Mother  Alive   Neg Hx  (Not Specified)  No partnership data on file   Family History  Problem Relation Age of Onset   Kidney disease Father    Coronary artery disease Father 42   COPD Father 11       copd   Heart disease Father    Colon polyps Mother    Osteoporosis Mother    Colon cancer Neg Hx    Allergies  Allergen Reactions   Other Other (See Comments)    Oral steroids; skin sensitivity   Sulindac Diarrhea and Other (See Comments)   Biaxin [Clarithromycin] Other (See  Comments)    Bad taste and ringing in ear with dec hearing   Calcium-Containing Compounds Diarrhea   Clindamycin/Lincomycin Dermatitis   Ketorolac Tromethamine Other (See Comments)    migraine   Metoprolol Tartrate Other (See Comments)    syncope   Pb-Hyoscy-Atropine-Scopolamine Hives   Penicillins     REACTION: HIVES   Phenobarbital Hives   Sulfonamide Derivatives    Topiramate Other (See Comments)    Kidney stones   Vortioxetine Diarrhea and Other (See Comments)   Zaleplon Other (See Comments)    Muscle weakness      Review of Systems  Constitutional:  Negative for chills, fever and malaise/fatigue.  HENT:  Negative for congestion and hearing loss.   Eyes:  Negative for blurred vision and discharge.  Respiratory:  Negative for cough, sputum production and shortness of breath.   Cardiovascular:  Negative for chest pain, palpitations and leg swelling.  Gastrointestinal:  Negative for abdominal pain, blood in stool, constipation, diarrhea, heartburn, nausea and vomiting.  Genitourinary:  Negative for dysuria, frequency, hematuria and urgency.  Musculoskeletal:  Negative for back pain, falls and myalgias.  Skin:  Negative for rash.  Neurological:  Negative for dizziness, sensory change, loss of consciousness, weakness and headaches.  Endo/Heme/Allergies:  Negative for environmental allergies. Does not bruise/bleed easily.  Psychiatric/Behavioral:  Negative for depression and  suicidal ideas. The patient is not nervous/anxious and does not have insomnia.       Objective:     BP (!) 150/90 (BP Location: Right Arm, Patient Position: Sitting, Cuff Size: Large)   Pulse 82   Temp 98.7 F (37.1 C) (Oral)   Resp 18   Ht 5\' 5"  (1.651 m)   Wt 218 lb 6.4 oz (99.1 kg)   SpO2 95%   BMI 36.34 kg/m  BP Readings from Last 3 Encounters:  07/23/23 (!) 150/90  05/31/23 (!) 160/98  11/08/22 (!) 138/90   Wt Readings from Last 3 Encounters:  07/23/23 218 lb 6.4 oz (99.1 kg)  05/31/23 218 lb 6.4 oz (99.1 kg)  01/09/23 213 lb (96.6 kg)   SpO2 Readings from Last 3 Encounters:  07/23/23 95%  05/31/23 96%  05/22/23 97%      Physical Exam Vitals and nursing note reviewed.  Constitutional:      General: She is not in acute distress.    Appearance: Normal appearance. She is well-developed.  HENT:     Head: Normocephalic and atraumatic.  Eyes:     General: No scleral icterus.       Right eye: No discharge.        Left eye: No discharge.  Cardiovascular:     Rate and Rhythm: Normal rate and regular rhythm.     Heart sounds: No murmur heard. Pulmonary:     Effort: Pulmonary effort is normal. No respiratory distress.     Breath sounds: Normal breath sounds.  Musculoskeletal:        General: Normal range of motion.     Cervical back: Normal range of motion and neck supple.     Right lower leg: No edema.     Left lower leg: No edema.  Skin:    General: Skin is warm and dry.  Neurological:     Mental Status: She is alert and oriented to person, place, and time.  Psychiatric:        Mood and Affect: Mood normal.        Behavior: Behavior normal.  Thought Content: Thought content normal.        Judgment: Judgment normal.      Results for orders placed or performed in visit on 07/23/23  POCT Urinalysis Dipstick (Automated)  Result Value Ref Range   Color, UA yellow    Clarity, UA clear    Glucose, UA Negative Negative   Bilirubin, UA Negative     Ketones, UA Negative    Spec Grav, UA <=1.005 (A) 1.010 - 1.025   Blood, UA Negative    pH, UA 6.0 5.0 - 8.0   Protein, UA Negative Negative   Urobilinogen, UA 0.2 0.2 or 1.0 E.U./dL   Nitrite, UA Negative    Leukocytes, UA Small (1+) (A) Negative    Last CBC Lab Results  Component Value Date   WBC 9.3 11/08/2022   HGB 14.7 11/08/2022   HCT 45.2 11/08/2022   MCV 86.6 11/08/2022   RDW 12.9 11/08/2022   PLT 326.0 11/08/2022   Last metabolic panel Lab Results  Component Value Date   GLUCOSE 93 11/08/2022   NA 139 11/08/2022   K 4.4 11/08/2022   CL 99 11/08/2022   CO2 30 11/08/2022   BUN 19 11/08/2022   CREATININE 1.19 11/08/2022   GFR 48.61 (L) 11/08/2022   CALCIUM 9.9 11/08/2022   PROT 7.6 11/08/2022   ALBUMIN 4.5 11/08/2022   BILITOT 0.3 11/08/2022   ALKPHOS 115 11/08/2022   AST 21 11/08/2022   ALT 20 11/08/2022   Last lipids Lab Results  Component Value Date   CHOL 306 (H) 11/08/2022   HDL 49.80 11/08/2022   LDLCALC 190 (H) 06/27/2015   LDLDIRECT 213.0 11/08/2022   TRIG 339.0 (H) 11/08/2022   CHOLHDL 6 11/08/2022   Last hemoglobin A1c Lab Results  Component Value Date   HGBA1C 5.4 08/15/2007   Last thyroid functions Lab Results  Component Value Date   TSH 0.34 (L) 11/08/2022   Last vitamin D Lab Results  Component Value Date   VD25OH 23 (L) 02/10/2010   Last vitamin B12 and Folate Lab Results  Component Value Date   VITAMINB12 544 02/07/2009   FOLATE 11.4 02/07/2009    The 10-year ASCVD risk score (Arnett DK, et al., 2019) is: 12.1%    Assessment & Plan:   Problem List Items Addressed This Visit       Unprioritized   Vaginal discharge - Primary   Self swab sent out Urine culture pending        Relevant Medications   fluconazole (DIFLUCAN) 150 MG tablet   Other Relevant Orders   Cervicovaginal ancillary only( [place])   Dysuria   Relevant Orders   POCT Urinalysis Dipstick (Automated) (Completed)   Urine Culture   Other  Visit Diagnoses       Leukocytes in urine       Relevant Orders   Urine Culture     Assessment and Plan    Vulvovaginal Discomfort Experiencing vulvovaginal discomfort with rawness, swelling, and blood on toilet paper from the labia. There is no dysuria, but a burning sensation occurs when urine contacts the raw area. No sexual activity for three years. Differential diagnosis includes yeast infection, bacterial infection, and UTI. A self-swab will be performed to check for yeast, bacteria, and STDs. A urine sample will be checked for UTI. A prescription for appropriate medication will be sent.  Vaginal Dryness Significant vaginal dryness causes discomfort and has led to avoidance of sexual activity for three years. Previously used estradiol cream with  some improvement but discontinued after a fall. Concerned about long-term hormone use. Estradiol cream can be restarted daily for a few weeks and then weaned to twice a week, with fewer risks compared to other hormone therapies. Consult OB-GYN if symptoms persist.  Diarrhea Experienced severe diarrhea for over half an hour, likely due to dietary causes from a [place] [employer]. No indication of COVID-19 or other systemic illness. Monitor symptoms and consider dietary causes.        Return if symptoms worsen or fail to improve.    Donato Schultz, DO

## 2023-07-25 ENCOUNTER — Encounter: Payer: Self-pay | Admitting: Family Medicine

## 2023-07-25 DIAGNOSIS — Z79891 Long term (current) use of opiate analgesic: Secondary | ICD-10-CM | POA: Diagnosis not present

## 2023-07-25 DIAGNOSIS — G894 Chronic pain syndrome: Secondary | ICD-10-CM | POA: Diagnosis not present

## 2023-07-25 LAB — CERVICOVAGINAL ANCILLARY ONLY
Bacterial Vaginitis (gardnerella): NEGATIVE
Candida Glabrata: NEGATIVE
Candida Vaginitis: NEGATIVE
Chlamydia: NEGATIVE
Comment: NEGATIVE
Comment: NEGATIVE
Comment: NEGATIVE
Comment: NEGATIVE
Comment: NEGATIVE
Comment: NORMAL
Neisseria Gonorrhea: NEGATIVE
Trichomonas: NEGATIVE

## 2023-07-30 ENCOUNTER — Other Ambulatory Visit (HOSPITAL_BASED_OUTPATIENT_CLINIC_OR_DEPARTMENT_OTHER): Payer: Self-pay

## 2023-07-30 ENCOUNTER — Other Ambulatory Visit: Payer: Self-pay

## 2023-07-31 DIAGNOSIS — E559 Vitamin D deficiency, unspecified: Secondary | ICD-10-CM | POA: Diagnosis not present

## 2023-07-31 DIAGNOSIS — I10 Essential (primary) hypertension: Secondary | ICD-10-CM | POA: Diagnosis not present

## 2023-07-31 DIAGNOSIS — E039 Hypothyroidism, unspecified: Secondary | ICD-10-CM | POA: Diagnosis not present

## 2023-07-31 DIAGNOSIS — E7800 Pure hypercholesterolemia, unspecified: Secondary | ICD-10-CM | POA: Diagnosis not present

## 2023-07-31 DIAGNOSIS — R7301 Impaired fasting glucose: Secondary | ICD-10-CM | POA: Diagnosis not present

## 2023-07-31 DIAGNOSIS — N200 Calculus of kidney: Secondary | ICD-10-CM | POA: Diagnosis not present

## 2023-08-02 ENCOUNTER — Other Ambulatory Visit (HOSPITAL_BASED_OUTPATIENT_CLINIC_OR_DEPARTMENT_OTHER): Payer: Self-pay

## 2023-08-15 DIAGNOSIS — L578 Other skin changes due to chronic exposure to nonionizing radiation: Secondary | ICD-10-CM | POA: Diagnosis not present

## 2023-08-15 DIAGNOSIS — L821 Other seborrheic keratosis: Secondary | ICD-10-CM | POA: Diagnosis not present

## 2023-08-15 DIAGNOSIS — L814 Other melanin hyperpigmentation: Secondary | ICD-10-CM | POA: Diagnosis not present

## 2023-08-15 DIAGNOSIS — D2372 Other benign neoplasm of skin of left lower limb, including hip: Secondary | ICD-10-CM | POA: Diagnosis not present

## 2023-08-15 DIAGNOSIS — D235 Other benign neoplasm of skin of trunk: Secondary | ICD-10-CM | POA: Diagnosis not present

## 2023-08-15 DIAGNOSIS — L720 Epidermal cyst: Secondary | ICD-10-CM | POA: Diagnosis not present

## 2023-08-15 DIAGNOSIS — D229 Melanocytic nevi, unspecified: Secondary | ICD-10-CM | POA: Diagnosis not present

## 2023-08-21 ENCOUNTER — Other Ambulatory Visit (HOSPITAL_BASED_OUTPATIENT_CLINIC_OR_DEPARTMENT_OTHER): Payer: Self-pay

## 2023-08-21 DIAGNOSIS — M47814 Spondylosis without myelopathy or radiculopathy, thoracic region: Secondary | ICD-10-CM | POA: Diagnosis not present

## 2023-08-21 DIAGNOSIS — M62838 Other muscle spasm: Secondary | ICD-10-CM | POA: Diagnosis not present

## 2023-08-21 DIAGNOSIS — G894 Chronic pain syndrome: Secondary | ICD-10-CM | POA: Diagnosis not present

## 2023-08-21 DIAGNOSIS — Z79891 Long term (current) use of opiate analgesic: Secondary | ICD-10-CM | POA: Diagnosis not present

## 2023-08-21 MED ORDER — OXYCODONE-ACETAMINOPHEN 5-325 MG PO TABS
1.0000 | ORAL_TABLET | ORAL | 0 refills | Status: DC
  Filled 2023-09-02: qty 150, 30d supply, fill #0

## 2023-08-21 MED ORDER — FENTANYL 50 MCG/HR TD PT72
1.0000 | MEDICATED_PATCH | TRANSDERMAL | 0 refills | Status: DC
  Filled 2023-09-02: qty 15, 30d supply, fill #0

## 2023-09-02 ENCOUNTER — Other Ambulatory Visit (HOSPITAL_BASED_OUTPATIENT_CLINIC_OR_DEPARTMENT_OTHER): Payer: Self-pay

## 2023-09-18 ENCOUNTER — Other Ambulatory Visit (HOSPITAL_BASED_OUTPATIENT_CLINIC_OR_DEPARTMENT_OTHER): Payer: Self-pay

## 2023-09-18 MED ORDER — OXYCODONE-ACETAMINOPHEN 5-325 MG PO TABS
1.0000 | ORAL_TABLET | Freq: Every day | ORAL | 0 refills | Status: DC | PRN
  Filled 2023-09-18: qty 150, 32d supply, fill #0
  Filled 2023-10-02: qty 150, 30d supply, fill #0

## 2023-09-18 MED ORDER — FENTANYL 50 MCG/HR TD PT72
1.0000 | MEDICATED_PATCH | TRANSDERMAL | 0 refills | Status: DC
Start: 2023-09-18 — End: 2023-10-22
  Filled 2023-09-18 – 2023-10-02 (×2): qty 15, 30d supply, fill #0

## 2023-10-02 ENCOUNTER — Other Ambulatory Visit (HOSPITAL_BASED_OUTPATIENT_CLINIC_OR_DEPARTMENT_OTHER): Payer: Self-pay

## 2023-10-17 ENCOUNTER — Other Ambulatory Visit: Payer: Self-pay | Admitting: Family Medicine

## 2023-10-17 DIAGNOSIS — R110 Nausea: Secondary | ICD-10-CM

## 2023-10-18 MED ORDER — PROMETHAZINE HCL 25 MG PO TABS: 25.0000 mg | ORAL_TABLET | ORAL | 0 refills | Status: AC | PRN

## 2023-10-22 ENCOUNTER — Other Ambulatory Visit (HOSPITAL_BASED_OUTPATIENT_CLINIC_OR_DEPARTMENT_OTHER): Payer: Self-pay

## 2023-10-22 DIAGNOSIS — M47814 Spondylosis without myelopathy or radiculopathy, thoracic region: Secondary | ICD-10-CM | POA: Diagnosis not present

## 2023-10-22 DIAGNOSIS — Z79891 Long term (current) use of opiate analgesic: Secondary | ICD-10-CM | POA: Diagnosis not present

## 2023-10-22 DIAGNOSIS — M62838 Other muscle spasm: Secondary | ICD-10-CM | POA: Diagnosis not present

## 2023-10-22 DIAGNOSIS — G894 Chronic pain syndrome: Secondary | ICD-10-CM | POA: Diagnosis not present

## 2023-10-22 MED ORDER — OXYCODONE-ACETAMINOPHEN 5-325 MG PO TABS
1.0000 | ORAL_TABLET | Freq: Every day | ORAL | 0 refills | Status: DC | PRN
  Filled 2023-10-22 – 2023-10-31 (×2): qty 150, 30d supply, fill #0

## 2023-10-22 MED ORDER — FENTANYL 50 MCG/HR TD PT72
1.0000 | MEDICATED_PATCH | TRANSDERMAL | 0 refills | Status: DC
  Filled 2023-10-22 – 2023-10-31 (×2): qty 15, 30d supply, fill #0

## 2023-10-31 ENCOUNTER — Other Ambulatory Visit (HOSPITAL_BASED_OUTPATIENT_CLINIC_OR_DEPARTMENT_OTHER): Payer: Self-pay

## 2023-11-20 ENCOUNTER — Other Ambulatory Visit (HOSPITAL_BASED_OUTPATIENT_CLINIC_OR_DEPARTMENT_OTHER): Payer: Self-pay

## 2023-11-20 DIAGNOSIS — G894 Chronic pain syndrome: Secondary | ICD-10-CM | POA: Diagnosis not present

## 2023-11-20 DIAGNOSIS — M47814 Spondylosis without myelopathy or radiculopathy, thoracic region: Secondary | ICD-10-CM | POA: Diagnosis not present

## 2023-11-20 DIAGNOSIS — Z79891 Long term (current) use of opiate analgesic: Secondary | ICD-10-CM | POA: Diagnosis not present

## 2023-11-20 DIAGNOSIS — M62838 Other muscle spasm: Secondary | ICD-10-CM | POA: Diagnosis not present

## 2023-11-20 MED ORDER — FENTANYL 50 MCG/HR TD PT72
1.0000 | MEDICATED_PATCH | TRANSDERMAL | 0 refills | Status: DC
  Filled 2023-11-29: qty 15, 30d supply, fill #0

## 2023-11-20 MED ORDER — OXYCODONE-ACETAMINOPHEN 5-325 MG PO TABS
1.0000 | ORAL_TABLET | Freq: Every day | ORAL | 0 refills | Status: DC
  Filled 2023-11-29: qty 150, 30d supply, fill #0

## 2023-11-28 ENCOUNTER — Encounter: Payer: Self-pay | Admitting: Family Medicine

## 2023-11-29 ENCOUNTER — Other Ambulatory Visit (HOSPITAL_BASED_OUTPATIENT_CLINIC_OR_DEPARTMENT_OTHER): Payer: Self-pay

## 2023-12-03 ENCOUNTER — Other Ambulatory Visit: Payer: Self-pay | Admitting: Family Medicine

## 2023-12-03 DIAGNOSIS — Z1231 Encounter for screening mammogram for malignant neoplasm of breast: Secondary | ICD-10-CM

## 2023-12-18 ENCOUNTER — Other Ambulatory Visit (HOSPITAL_BASED_OUTPATIENT_CLINIC_OR_DEPARTMENT_OTHER): Payer: Self-pay

## 2023-12-18 DIAGNOSIS — M47814 Spondylosis without myelopathy or radiculopathy, thoracic region: Secondary | ICD-10-CM | POA: Diagnosis not present

## 2023-12-18 DIAGNOSIS — M62838 Other muscle spasm: Secondary | ICD-10-CM | POA: Diagnosis not present

## 2023-12-18 DIAGNOSIS — Z79891 Long term (current) use of opiate analgesic: Secondary | ICD-10-CM | POA: Diagnosis not present

## 2023-12-18 DIAGNOSIS — G894 Chronic pain syndrome: Secondary | ICD-10-CM | POA: Diagnosis not present

## 2023-12-18 MED ORDER — TIZANIDINE HCL 4 MG PO TABS
4.0000 mg | ORAL_TABLET | Freq: Three times a day (TID) | ORAL | 1 refills | Status: AC | PRN
  Filled 2024-01-15: qty 270, 90d supply, fill #0

## 2023-12-18 MED ORDER — OXYCODONE-ACETAMINOPHEN 5-325 MG PO TABS
ORAL_TABLET | ORAL | 0 refills | Status: AC
  Filled 2023-12-30: qty 150, 30d supply, fill #0

## 2023-12-18 MED ORDER — FENTANYL 50 MCG/HR TD PT72
MEDICATED_PATCH | TRANSDERMAL | 0 refills | Status: DC
  Filled 2023-12-30: qty 15, 30d supply, fill #0

## 2023-12-24 ENCOUNTER — Ambulatory Visit

## 2023-12-30 ENCOUNTER — Other Ambulatory Visit (HOSPITAL_BASED_OUTPATIENT_CLINIC_OR_DEPARTMENT_OTHER): Payer: Self-pay

## 2024-01-01 ENCOUNTER — Ambulatory Visit
Admission: RE | Admit: 2024-01-01 | Discharge: 2024-01-01 | Disposition: A | Discharge status: Home / Self Care | Source: Ambulatory Visit | Attending: Family Medicine | Admitting: Family Medicine

## 2024-01-01 DIAGNOSIS — Z1231 Encounter for screening mammogram for malignant neoplasm of breast: Secondary | ICD-10-CM | POA: Diagnosis not present

## 2024-01-03 ENCOUNTER — Other Ambulatory Visit (HOSPITAL_BASED_OUTPATIENT_CLINIC_OR_DEPARTMENT_OTHER): Payer: Self-pay

## 2024-01-14 ENCOUNTER — Ambulatory Visit (INDEPENDENT_AMBULATORY_CARE_PROVIDER_SITE_OTHER): Payer: Medicare Other | Admitting: *Deleted

## 2024-01-14 VITALS — Ht 65.0 in | Wt 211.0 lb

## 2024-01-14 DIAGNOSIS — M8580 Other specified disorders of bone density and structure, unspecified site: Secondary | ICD-10-CM

## 2024-01-14 DIAGNOSIS — Z0000 Encounter for general adult medical examination without abnormal findings: Secondary | ICD-10-CM | POA: Diagnosis not present

## 2024-01-14 NOTE — Progress Notes (Unsigned)
 Subjective:   Karen Brown is a 63 y.o. who presents for a Medicare Wellness preventive visit.  As a reminder, Annual Wellness Visits don't include a physical exam, and some assessments may be limited, especially if this visit is performed virtually. We may recommend an in-person follow-up visit with your provider if needed.  Visit Complete: Virtual I connected with  Shareta C Peralta on 01/14/24 by a audio enabled telemedicine application and verified that I am speaking with the correct person using two identifiers.  Patient Location: Home  Provider Location: Office/Clinic  I discussed the limitations of evaluation and management by telemedicine. The patient expressed understanding and agreed to proceed.  Vital Signs: Because this visit was a virtual/telehealth visit, some criteria may be missing or patient reported. Any vitals not documented were not able to be obtained and vitals that have been documented are patient reported.  VideoDeclined- This patient declined Librarian, academic. Therefore the visit was completed with audio only.  Persons Participating in Visit: Patient.  AWV Questionnaire: No: Patient Medicare AWV questionnaire was not completed prior to this visit.  Cardiac Risk Factors include: hypertension;obesity (BMI >30kg/m2);Other (see comment), Risk factor comments: PVCs     Objective:    Today's Vitals   01/14/24 1340  Weight: 211 lb (95.7 kg)  Height: 5' 5 (1.651 m)   Body mass index is 35.11 kg/m.     01/14/2024    2:16 PM 01/09/2023    2:38 PM 01/02/2022    1:17 PM 12/13/2020    2:28 PM 07/24/2019    3:20 PM 03/19/2019    7:41 AM 07/22/2018    3:34 PM  Advanced Directives  Does Patient Have a Medical Advance Directive? No No Yes No No No No   Type of Publishing rights manager of Healthcare Power of Attorney in Chart?   No - copy requested      Would patient like information on creating a medical  advance directive? No - Patient declined Yes (MAU/Ambulatory/Procedural Areas - Information given)  No - Patient declined No - Patient declined No - Patient declined Yes (MAU/Ambulatory/Procedural Areas - Information given)      Data saved with a previous flowsheet row definition    Current Medications (verified) Outpatient Encounter Medications as of 01/14/2024  Medication Sig   atenolol (TENORMIN) 25 MG tablet 1 tablet   AUVELITY 45-105 MG TBCR Take by mouth.   butalbital -acetaminophen -caffeine  (BAC) 50-325-40 MG tablet Take 1 tablet by mouth 3 (three) times daily as needed.   Cholecalciferol 50 MCG (2000 UT) CAPS Vitamin D3 50 mcg (2,000 unit) capsule   1 capsule every day by oral route.   clonazePAM (KLONOPIN) 1 MG tablet Take 1 mg by mouth 3 (three) times daily as needed.   clotrimazole -betamethasone  (LOTRISONE ) cream Apply 1 Application topically 2 (two) times daily.   fentaNYL  (DURAGESIC ) 50 MCG/HR Apply 1 patch to skin every 48 hours as directed   fluticasone  (FLONASE ) 50 MCG/ACT nasal spray Place 2 sprays into both nostrils daily.   lamoTRIgine (LAMICTAL) 25 MG tablet Take 25 mg by mouth 2 (two) times daily. 1 in the morning and 1 in the evening   OVER THE COUNTER MEDICATION Take 1 capsule by mouth in the morning and at bedtime. AVOVIDA   levothyroxine  (SYNTHROID ) 175 MCG tablet Take 1 tablet (175 mcg total) by mouth every morning on an empty stomach 6 days/week.   levothyroxine  (SYNTHROID ) 50  MCG tablet Take 1 tablet (50 mcg total) by mouth every morning on an empty stomach one day/week 90 days.   lisinopril (PRINIVIL,ZESTRIL) 20 MG tablet Take 1 tablet by mouth daily.   oxyCODONE -acetaminophen  (PERCOCET/ROXICET) 5-325 MG tablet 1 tablet by mouth five times a day as needed for pain stop percocet 10/325   promethazine  (PHENERGAN ) 25 MG tablet Take 1 tablet (25 mg total) by mouth as needed.   tiZANidine  (ZANAFLEX ) 4 MG tablet Take 1 tablet (4 mg total) by mouth 3 (three) times daily as  needed.   [DISCONTINUED] albuterol  (VENTOLIN  HFA) 108 (90 Base) MCG/ACT inhaler Inhale 2 puffs into the lungs every 6 (six) hours as needed. (Patient not taking: Reported on 01/14/2024)   [DISCONTINUED] benzonatate  (TESSALON ) 100 MG capsule Take 1 capsule (100 mg total) by mouth 3 (three) times daily as needed for cough.   [DISCONTINUED] butalbital -acetaminophen -caffeine  (BAC) 50-325-40 MG tablet Take 1 tablet by mouth 3 (three) times daily as needed.   [DISCONTINUED] fentaNYL  (DURAGESIC ) 25 MCG/HR Place 1 patch onto the skin every other day (every 48 hours) as directed. Stop fentanyl  50 mcg/hr.   [DISCONTINUED] fentaNYL  (DURAGESIC ) 50 MCG/HR Apply 1 patch to skin every 48 hours as directed   [DISCONTINUED] fentaNYL  (DURAGESIC ) 50 MCG/HR Place 1 patch onto the skin every other day (every 48 hours) as directed.   [DISCONTINUED] fentaNYL  (DURAGESIC ) 50 MCG/HR Place 1 patch onto the skin every other day as directed   [DISCONTINUED] fluconazole  (DIFLUCAN ) 150 MG tablet Take 1 tablet (150 mg total) by mouth daily. May repeat in 3 days if needed.   [DISCONTINUED] levothyroxine  (SYNTHROID ) 150 MCG tablet Take 150 mcg by mouth daily before breakfast.   [DISCONTINUED] levothyroxine  (SYNTHROID ) 175 MCG tablet Take 1 tablet (175 mcg total) by mouth in the morning on an empty stomach.   [DISCONTINUED] levothyroxine  (SYNTHROID ) 175 MCG tablet Take 1 tablet (175 mcg total) by mouth in the morning on an empty stomach, 6 days/week for 30 days.   [DISCONTINUED] OVER THE COUNTER MEDICATION Take 3 tablets by mouth daily. cartilast   [DISCONTINUED] oxyCODONE -acetaminophen  (PERCOCET) 10-325 MG tablet Take 1 tablet by mouth daily. Will take up to 6 tabs a day   [DISCONTINUED] oxyCODONE -acetaminophen  (PERCOCET) 10-325 MG tablet Take 1 tablet by mouth every 4 (four) hours as needed for pain.   [DISCONTINUED] oxyCODONE -acetaminophen  (PERCOCET) 10-325 MG tablet Take 1 tablet by mouth every 4 (four) hours as needed for pain. Stop  percocet 7.5/325.   [DISCONTINUED] oxyCODONE -acetaminophen  (PERCOCET) 7.5-325 MG tablet Take 1 tablet by mouth every 4 (four) hours as needed for pain.   [DISCONTINUED] oxyCODONE -acetaminophen  (PERCOCET/ROXICET) 5-325 MG tablet Take 1 tablet by mouth every 4 (four) hours as needed for pain stop percocet 10/325.   [DISCONTINUED] oxyCODONE -acetaminophen  (PERCOCET/ROXICET) 5-325 MG tablet Take 1 tablet by mouth every 4 (four) hours as needed for pain stop percocet 10/325   [DISCONTINUED] oxyCODONE -acetaminophen  (PERCOCET/ROXICET) 5-325 MG tablet Take 1 tablet by mouth every 4 (four) hours as needed for pain. *Stop percocet 10/325.*   [DISCONTINUED] oxyCODONE -acetaminophen  (PERCOCET/ROXICET) 5-325 MG tablet Take 1 tablet by mouth five times a day as needed for pain *stop percocet 10/325*   [DISCONTINUED] oxyCODONE -acetaminophen  (PERCOCET/ROXICET) 5-325 MG tablet Take 1 tablet by mouth 5 (five) times daily as needed for pain. *Stop percocet 10/325.*   [DISCONTINUED] oxyCODONE -acetaminophen  (PERCOCET/ROXICET) 5-325 MG tablet Take 1 tablet by mouth 5 (five) times daily as needed for pain   [DISCONTINUED] oxyCODONE -acetaminophen  (PERCOCET/ROXICET) 5-325 MG tablet Take 1 tablet by mouth 5 (five) times daily as  needed for pain. Stop percocet 10/325.   [DISCONTINUED] oxyCODONE -acetaminophen  (PERCOCET/ROXICET) 5-325 MG tablet Take 1 tablet by mouth 5 (five) times daily as needed for pain. Stop percocet 10/325.   [DISCONTINUED] tiZANidine  (ZANAFLEX ) 4 MG capsule Take 4 mg by mouth 3 (three) times daily as needed for muscle spasms.   No facility-administered encounter medications on file as of 01/14/2024.    Allergies (verified) Other, Sulindac , Biaxin  [clarithromycin ], Calcium -containing compounds, Clindamycin/lincomycin, Ketorolac tromethamine, Metoprolol tartrate, Pb-hyoscy-atropine-scopolamine, Penicillins, Phenobarbital, Sulfonamide derivatives, Topiramate, Vortioxetine, and Zaleplon   History: Past Medical  History:  Diagnosis Date   Anxiety    Bipolar 1 disorder (HCC)    Chronic migraine    Chronically dry eyes    Colitis, ischemic (HCC)    Complication of anesthesia    versed  does not work as stated per pt    Depression    Diverticulosis    Dry mouth    Family history of adverse reaction to anesthesia    Fibromyalgia    GERD (gastroesophageal reflux disease)    Hemorrhoids    Hyperlipemia    Hypertension    Hypothyroidism    IBS (irritable bowel syndrome)    PVC (premature ventricular contraction)    Past Surgical History:  Procedure Laterality Date   ABDOMINAL HYSTERECTOMY     Partial    APPENDECTOMY     BACK SURGERY     Micro diskectomy   BACK SURGERY     L facet rhzotomy   CHOLECYSTECTOMY     EXTRACORPOREAL SHOCK WAVE LITHOTRIPSY Left 03/19/2019   Procedure: EXTRACORPOREAL SHOCK WAVE LITHOTRIPSY (ESWL);  Surgeon: Devere Lonni Righter, MD;  Location: [facility];  Service: Urology;  Laterality: Left;   TONSILLECTOMY     Family History  Problem Relation Age of Onset   Kidney disease Father    Coronary artery disease Father 59   COPD Father 71       copd   Heart disease Father    Colon polyps Mother    Osteoporosis Mother    Colon cancer Neg Hx    Social History   Socioeconomic History   Marital status: Married    Spouse name: Acupuncturist   Number of children: 0   Years of education: Not on file   Highest education level: Some college, no degree  Occupational History   Occupation: Disabled     Employer: [employer]  Tobacco Use   Smoking status: Never   Smokeless tobacco: Never  Vaping Use   Vaping status: Never Used  Substance and Sexual Activity   Alcohol use: No   Drug use: No   Sexual activity: Yes  Other Topics Concern   Not on file  Social History Narrative   Married, disabled, unable to have children   Social Drivers of Corporate investment banker Strain: Low Risk  (01/14/2024)   Overall Financial Resource Strain (CARDIA)    Difficulty of Paying  Living Expenses: Not hard at all  Food Insecurity: No Food Insecurity (01/14/2024)   Hunger Vital Sign    Worried About Running Out of Food in the Last Year: Never true    Ran Out of Food in the Last Year: Never true  Transportation Needs: No Transportation Needs (01/14/2024)   PRAPARE - Administrator, Civil Service (Medical): No    Lack of Transportation (Non-Medical): No  Physical Activity: Inactive (01/14/2024)   Exercise Vital Sign    Days of Exercise per Week: 0 days    Minutes of Exercise  per Session: 0 min  Stress: Stress Concern Present (01/14/2024)   [employer] of Occupational Health - Occupational Stress Questionnaire    Feeling of Stress: To some extent  Social Connections: Moderately Integrated (01/14/2024)   Social Connection and Isolation Panel    Frequency of Communication with Friends and Family: More than three times a week    Frequency of Social Gatherings with Friends and Family: Once a week    Attends Religious Services: Never    Database administrator or Organizations: Yes    Attends Banker Meetings: Never    Marital Status: Married    Tobacco Counseling Counseling given: Not Answered    Clinical Intake:  Pre-visit preparation completed: Yes        BMI - recorded: 35.11 Nutritional Status: BMI > 30  Obese Nutritional Risks: None Diabetes: No  Lab Results  Component Value Date   HGBA1C 5.4 08/15/2007     How often do you need to have someone help you when you read instructions, pamphlets, or other written materials from your doctor or pharmacy?: 1 - Never What is the last grade level you completed in school?: SOME COLLEGE  Interpreter Needed?: No  Information entered by :: Lolita Libra, CMA   Activities of Daily Living     01/14/2024    2:13 PM  In your present state of health, do you have any difficulty performing the following activities:  Hearing? 0  Vision? 0  Difficulty concentrating or making decisions?  0  Walking or climbing stairs? 0  Dressing or bathing? 0  Doing errands, shopping? 0  Preparing Food and eating ? N  Using the Toilet? N  In the past six months, have you accidently leaked urine? N  Do you have problems with loss of bowel control? N  Comment notes frequent diarrhea (3 x monthly) depending on what she eats.  Managing your Medications? N  Managing your Finances? N  Housekeeping or managing your Housekeeping? N    Patient Care Team: Antonio Meth, Jamee SAUNDERS, DO as PCP - General Orlando Anes, MD as Consulting Physician (Anesthesiology) Tommas Pears, MD as Consulting Physician (Endocrinology) Vincente Grip, MD as Consulting Physician (Psychiatry) Fara Elveria RIGGERS (Physician Assistant) Nieves Cough, MD as Consulting Physician (Urology) [facility] (Ophthalmology)  I have updated your Care Teams any recent Medical Services you may have received from other providers in the past year.     Assessment:   This is a routine wellness examination for Bel[place].  Hearing/Vision screen Hearing Screening - Comments:: Denies hearing difficulties.  Vision Screening - Comments:: Wears RX glasses -- up to date with routine eye exams at [facility].    Goals Addressed   None    Depression Screen     01/14/2024    2:01 PM 01/09/2023    2:34 PM 09/27/2022    2:53 PM 01/02/2022    1:15 PM 12/13/2020    2:31 PM 07/24/2019    3:27 PM 07/22/2018    5:57 PM  PHQ 2/9 Scores  PHQ - 2 Score 2 2 0 1 1 0 4  PHQ- 9 Score 8 3  4   11     Fall Risk     01/14/2024    1:54 PM 01/08/2023    4:56 PM 09/27/2022    2:52 PM 01/02/2022    1:17 PM 12/13/2020    2:30 PM  Fall Risk   Falls in the past year? 0 1 1 0 0  Number falls in past yr: 0 0 1 0 0  Injury with Fall? 0 1 1 0 0  Risk for fall due to : Impaired mobility History of fall(s);Impaired mobility;Medication side effect;Orthopedic patient History of fall(s) Impaired vision   Follow up Education provided  Falls prevention discussed;Falls evaluation completed Falls evaluation completed Falls prevention discussed  Falls prevention discussed      Data saved with a previous flowsheet row definition    MEDICARE RISK AT HOME:  Medicare Risk at Home Any stairs in or around the home?: No Home free of loose throw rugs in walkways, pet beds, electrical cords, etc?: Yes Adequate lighting in your home to reduce risk of falls?: Yes Life alert?: No Use of a cane, walker or w/c?: Yes Grab bars in the bathroom?: No Shower chair or bench in shower?: Yes Elevated toilet seat or a handicapped toilet?: Yes  TIMED UP AND GO:  Was the test performed?  No, audio  Cognitive Function: 6CIT completed    07/02/2016    1:20 PM  MMSE - Mini Mental State Exam  Orientation to time 5   Orientation to Place 5   Registration 3   Attention/ Calculation 5   Recall 3   Language- name 2 objects 2   Language- repeat 1  Language- follow 3 step command 3   Language- read & follow direction 1   Write a sentence 1   Copy design 1   Total score 30      Data saved with a previous flowsheet row definition        01/14/2024    2:17 PM 01/09/2023    2:41 PM 01/02/2022    1:19 PM  6CIT Screen  What Year? 0 points 0 points 0 points  What month? 0 points 0 points 0 points  What time? 0 points 0 points 0 points  Count back from 20 0 points 0 points 0 points  Months in reverse 0 points 0 points 0 points  Repeat phrase 0 points 0 points 0 points  Total Score 0 points 0 points 0 points    Immunizations Immunization History  Administered Date(s) Administered   Influenza Inj Mdck Quad Pf 04/04/2017   Influenza Split 04/09/2011, 04/03/2012   Influenza Whole 03/15/2010   Influenza, Mdck, Trivalent,PF 6+ MOS(egg free) 04/04/2023   Influenza,inj,Quad PF,6+ Mos 04/07/2013, 03/26/2014, 03/22/2015, 04/13/2016, 02/26/2019, 03/06/2022, 04/04/2023   Influenza-Unspecified 04/04/2017, 04/09/2018, 03/23/2021   PFIZER  Comirnaty(Gray Top)Covid-19 Tri-Sucrose Vaccine 10/21/2020   PFIZER(Purple Top)SARS-COV-2 Vaccination 09/07/2019, 09/28/2019, 04/11/2020, 10/21/2020   Pfizer Covid-19 Vaccine Bivalent Booster 61yrs & up 04/22/2021   Td 01/23/2007   Tdap 07/04/2017   Unspecified SARS-COV-2 Vaccination 09/07/2019, 09/28/2019, 04/11/2020, 10/21/2020, 04/22/2021   Zoster Recombinant(Shingrix) 08/12/2021, 12/15/2021    Screening Tests Health Maintenance  Topic Date Due   HIV Screening  Never done   Hepatitis C Screening  Never done   Pneumococcal Vaccine: 50+ Years (1 of 1 - PCV) Never done   DEXA SCAN  11/10/2023   Medicare Annual Wellness (AWV)  01/09/2024   INFLUENZA VACCINE  01/10/2024   Colonoscopy  01/13/2025 (Originally 12/10/2019)   MAMMOGRAM  12/31/2024   DTaP/Tdap/Td (3 - Td or Tdap) 07/05/2027   Zoster Vaccines- Shingrix  Completed   Hepatitis B Vaccines  Aged Out   HPV VACCINES  Aged Out   Meningococcal B Vaccine  Aged Out   COVID-19 Vaccine  Discontinued    Health Maintenance  Health Maintenance Due  Topic Date Due  HIV Screening  Never done   Hepatitis C Screening  Never done   Pneumococcal Vaccine: 50+ Years (1 of 1 - PCV) Never done   DEXA SCAN  11/10/2023   Medicare Annual Wellness (AWV)  01/09/2024   INFLUENZA VACCINE  01/10/2024   Health Maintenance Items Addressed: Dexa ordered.  Pt declines HIV and Hep C screening. She is undecided about Pneumonia vaccine.  Additional Screening:  Vision Screening: Recommended annual ophthalmology exams for early detection of glaucoma and other disorders of the eye. Would you like a referral to an eye doctor? No    Dental Screening: Recommended annual dental exams for proper oral hygiene  Community Resource Referral / Chronic Care Management: CRR required this visit?  Referral not placed, gave pt name of Veva Alma to reach out to as her previous Provider has retired from the same group. Pt will call to schedule appt.  CCM required  this visit?  No   Plan:    I have personally reviewed and noted the following in the patient's chart:   Medical and social history Use of alcohol, tobacco or illicit drugs  Current medications and supplements including opioid prescriptions. Patient is currently taking opioid prescriptions. Information provided to patient regarding non-opioid alternatives. Patient advised to discuss non-opioid treatment plan with their provider. Functional ability and status Nutritional status Physical activity Advanced directives List of other physicians Hospitalizations, surgeries, and ER visits in previous 12 months Vitals Screenings to include cognitive, depression, and falls Referrals and appointments  In addition, I have reviewed and discussed with patient certain preventive protocols, quality metrics, and best practice recommendations. A written personalized care plan for preventive services as well as general preventive health recommendations were provided to patient.   Lolita Libra, CMA   01/15/2024   After Visit Summary: (Mail) Due to this being a telephonic visit, the after visit summary with patients personalized plan was offered to patient via mail   Notes:  See phone note

## 2024-01-15 ENCOUNTER — Other Ambulatory Visit (HOSPITAL_BASED_OUTPATIENT_CLINIC_OR_DEPARTMENT_OTHER): Payer: Self-pay

## 2024-01-15 ENCOUNTER — Telehealth: Payer: Self-pay | Admitting: *Deleted

## 2024-01-15 NOTE — Telephone Encounter (Signed)
 Pt had AWV yesterday.  Scored 8 on her depression screen. Already on medications. Pt is unable to go out and participate in any activities and reports losing 2 close friends over this as they have just stopped reaching out to her.  Previous therapist is no longer with the practice.  Gave pt contact info for [employer] as pt preferred to call them herself. I have scheduled pt for CPE with you on 02/18/24

## 2024-01-15 NOTE — Patient Instructions (Addendum)
 Ms. Mcelwain , Thank you for taking time out of your busy schedule to complete your Annual Wellness Visit with me. I enjoyed our conversation and look forward to speaking with you again next year. I, as well as your care team,  appreciate your ongoing commitment to your health goals. Please review the following plan we discussed and let me know if I can assist you in the future. Your Game plan/ To Do List    Referrals: If you haven't heard from the office you've been referred to, please reach out to them at the phone provided.   [facility]:  [phone] Veva Alma [facility]): [phone]  Follow up Visits: Next Medicare AWV with our clinical staff: 01/14/25 1:40pm  Next Office Visit with your provider: 02/18/24 1:20pm, physical.   Clinician Recommendations:  Aim for 30 minutes of exercise or brisk walking, 6-8 glasses of water, and 5 servings of fruits and vegetables each  day.   You will need to get the following vaccines at your local pharmacy (if you change your mind): Pneumonia      This is a list of the screening recommended for you and due dates:  Health Maintenance  Topic Date Due   Pneumococcal Vaccine for age over 28 (1 of 1 - PCV) Never done   DEXA scan (bone density measurement)  11/10/2023   Flu Shot  01/10/2024   Colon Cancer Screening  01/13/2025*   Hepatitis C Screening  01/14/2025*   HIV Screening  01/14/2025*   Mammogram  12/31/2024   Medicare Annual Wellness Visit  01/13/2025   DTaP/Tdap/Td vaccine (3 - Td or Tdap) 07/05/2027   Zoster (Shingles) Vaccine  Completed   Hepatitis B Vaccine  Aged Out   HPV Vaccine  Aged Out   Meningitis B Vaccine  Aged Out   COVID-19 Vaccine  Discontinued  *Topic was postponed. The date shown is not the original due date.    Advanced directives: (ACP Link)Information on Advanced Care Planning can be found at [place]  Secretary of [facility] Advance Health Care Directives Advance Health Care Directives. [email or web address]   Advance Care Planning is important because it:  [x]  Makes sure you receive the medical care that is consistent with your values, goals, and preferences  [x]  It provides guidance to your family and loved ones and reduces their decisional burden about whether or not they are making the right decisions based on your wishes.  Follow the link provided in your after visit summary or read over the paperwork we have mailed to you to help you started getting your Advance Directives in place. If you need assistance in completing these, please reach out to us  so that we can help you!  See attachments for Preventive Care and Fall Prevention Tips.

## 2024-01-21 ENCOUNTER — Other Ambulatory Visit (HOSPITAL_BASED_OUTPATIENT_CLINIC_OR_DEPARTMENT_OTHER): Payer: Self-pay

## 2024-01-21 DIAGNOSIS — G894 Chronic pain syndrome: Secondary | ICD-10-CM | POA: Diagnosis not present

## 2024-01-21 DIAGNOSIS — M47814 Spondylosis without myelopathy or radiculopathy, thoracic region: Secondary | ICD-10-CM | POA: Diagnosis not present

## 2024-01-21 DIAGNOSIS — Z79891 Long term (current) use of opiate analgesic: Secondary | ICD-10-CM | POA: Diagnosis not present

## 2024-01-21 DIAGNOSIS — M62838 Other muscle spasm: Secondary | ICD-10-CM | POA: Diagnosis not present

## 2024-01-21 MED ORDER — FENTANYL 50 MCG/HR TD PT72
1.0000 | MEDICATED_PATCH | TRANSDERMAL | 0 refills | Status: DC
  Filled 2024-01-27: qty 15, 30d supply, fill #0

## 2024-01-21 MED ORDER — OXYCODONE-ACETAMINOPHEN 5-325 MG PO TABS
1.0000 | ORAL_TABLET | Freq: Every day | ORAL | 0 refills | Status: AC | PRN
  Filled 2024-01-27: qty 150, 30d supply, fill #0

## 2024-01-27 ENCOUNTER — Other Ambulatory Visit (HOSPITAL_BASED_OUTPATIENT_CLINIC_OR_DEPARTMENT_OTHER): Payer: Self-pay

## 2024-01-29 DIAGNOSIS — E7800 Pure hypercholesterolemia, unspecified: Secondary | ICD-10-CM | POA: Diagnosis not present

## 2024-01-29 DIAGNOSIS — R7301 Impaired fasting glucose: Secondary | ICD-10-CM | POA: Diagnosis not present

## 2024-01-29 DIAGNOSIS — I10 Essential (primary) hypertension: Secondary | ICD-10-CM | POA: Diagnosis not present

## 2024-01-29 DIAGNOSIS — N200 Calculus of kidney: Secondary | ICD-10-CM | POA: Diagnosis not present

## 2024-01-29 DIAGNOSIS — E559 Vitamin D deficiency, unspecified: Secondary | ICD-10-CM | POA: Diagnosis not present

## 2024-01-29 DIAGNOSIS — E039 Hypothyroidism, unspecified: Secondary | ICD-10-CM | POA: Diagnosis not present

## 2024-01-30 ENCOUNTER — Other Ambulatory Visit (HOSPITAL_BASED_OUTPATIENT_CLINIC_OR_DEPARTMENT_OTHER): Payer: Self-pay

## 2024-01-30 MED ORDER — LEVOTHYROXINE SODIUM 175 MCG PO TABS
175.0000 ug | ORAL_TABLET | Freq: Every day | ORAL | 11 refills | Status: AC
  Filled 2024-01-30: qty 90, 90d supply, fill #0
  Filled 2024-05-01 – 2024-05-06 (×2): qty 90, 90d supply, fill #1

## 2024-02-04 DIAGNOSIS — N200 Calculus of kidney: Secondary | ICD-10-CM | POA: Diagnosis not present

## 2024-02-12 ENCOUNTER — Other Ambulatory Visit (HOSPITAL_BASED_OUTPATIENT_CLINIC_OR_DEPARTMENT_OTHER): Payer: Self-pay

## 2024-02-12 DIAGNOSIS — M62838 Other muscle spasm: Secondary | ICD-10-CM | POA: Diagnosis not present

## 2024-02-12 DIAGNOSIS — M47814 Spondylosis without myelopathy or radiculopathy, thoracic region: Secondary | ICD-10-CM | POA: Diagnosis not present

## 2024-02-12 DIAGNOSIS — G894 Chronic pain syndrome: Secondary | ICD-10-CM | POA: Diagnosis not present

## 2024-02-12 DIAGNOSIS — Z79891 Long term (current) use of opiate analgesic: Secondary | ICD-10-CM | POA: Diagnosis not present

## 2024-02-12 MED ORDER — FENTANYL 50 MCG/HR TD PT72
1.0000 | MEDICATED_PATCH | TRANSDERMAL | 0 refills | Status: DC
  Filled 2024-02-27: qty 15, 30d supply, fill #0

## 2024-02-12 MED ORDER — OXYCODONE-ACETAMINOPHEN 5-325 MG PO TABS
1.0000 | ORAL_TABLET | Freq: Every day | ORAL | 0 refills | Status: AC
  Filled 2024-02-27: qty 150, 30d supply, fill #0

## 2024-02-18 ENCOUNTER — Encounter: Admitting: Family Medicine

## 2024-02-25 ENCOUNTER — Ambulatory Visit (INDEPENDENT_AMBULATORY_CARE_PROVIDER_SITE_OTHER): Admitting: Family Medicine

## 2024-02-25 ENCOUNTER — Encounter: Payer: Self-pay | Admitting: Family Medicine

## 2024-02-25 VITALS — BP 110/72 | HR 57 | Temp 99.0°F | Resp 18 | Ht 65.0 in | Wt 213.8 lb

## 2024-02-25 DIAGNOSIS — I10 Essential (primary) hypertension: Secondary | ICD-10-CM | POA: Diagnosis not present

## 2024-02-25 DIAGNOSIS — E039 Hypothyroidism, unspecified: Secondary | ICD-10-CM | POA: Diagnosis not present

## 2024-02-25 DIAGNOSIS — E781 Pure hyperglyceridemia: Secondary | ICD-10-CM | POA: Diagnosis not present

## 2024-02-25 DIAGNOSIS — Z23 Encounter for immunization: Secondary | ICD-10-CM | POA: Diagnosis not present

## 2024-02-25 DIAGNOSIS — Z0000 Encounter for general adult medical examination without abnormal findings: Secondary | ICD-10-CM | POA: Diagnosis not present

## 2024-02-25 NOTE — Progress Notes (Unsigned)
 Subjective:    Patient ID: Karen Brown, female    DOB: 1960/07/22, 63 y.o.   MRN: [id number]  Chief Complaint  Patient presents with   Annual Exam    Pt states fasting     HPI Patient is in today for cpe  Discussed the use of AI scribe software for clinical note transcription with the patient, who gave verbal consent to proceed.  History of Present Illness Karen Brown is a 63 year old female who presents with depression and plantar fasciitis.  She feels consistently tired and down, attributing her mood to recent distressing news events and a general sense of societal decline. The past two weeks have been particularly difficult due to 'awful news'.  She has plantar fasciitis, which she describes as 'really bad'. She finds relief using specific footwear designed for comfort. The condition impacts her ability to engage in social activities, such as going to the pool, due to discomfort and lack of suitable seating.  She experiences migraines triggered by heat and crying, which further limits her social activities and ability to tolerate certain environments.  She mentions difficulty bending over, which affects her ability to shave her legs, and she has stopped attending the pool due to discomfort and lack of social interaction.  Her brother is experiencing health issues, including prostate cancer and depression, following his retirement and knee replacement surgery.    Past Medical History:  Diagnosis Date   Anxiety    Bipolar 1 disorder (HCC)    Chronic migraine    Chronically dry eyes    Colitis, ischemic (HCC)    Complication of anesthesia    versed  does not work as stated per pt    Depression    Diverticulosis    Dry mouth    Family history of adverse reaction to anesthesia    Fibromyalgia    GERD (gastroesophageal reflux disease)    Hemorrhoids    Hyperlipemia    Hypertension    Hypothyroidism    IBS (irritable bowel syndrome)    PVC (premature ventricular  contraction)     Past Surgical History:  Procedure Laterality Date   ABDOMINAL HYSTERECTOMY     Partial    APPENDECTOMY     BACK SURGERY     Micro diskectomy   BACK SURGERY     L facet rhzotomy   CHOLECYSTECTOMY     EXTRACORPOREAL SHOCK WAVE LITHOTRIPSY Left 03/19/2019   Procedure: EXTRACORPOREAL SHOCK WAVE LITHOTRIPSY (ESWL);  Surgeon: Devere Lonni Righter, MD;  Location: [facility];  Service: Urology;  Laterality: Left;   TONSILLECTOMY      Family History  Problem Relation Age of Onset   Colon polyps Mother    Osteoporosis Mother    Kidney disease Father    Coronary artery disease Father 35   COPD Father 46       copd   Heart disease Father    Prostate cancer Brother    Colon cancer Neg Hx     Social History   Socioeconomic History   Marital status: Married    Spouse name: Acupuncturist   Number of children: 0   Years of education: Not on file   Highest education level: Some college, no degree  Occupational History   Occupation: Disabled     Employer: [employer]  Tobacco Use   Smoking status: Never   Smokeless tobacco: Never  Vaping Use   Vaping status: Never Used  Substance and Sexual Activity   Alcohol use: No  Drug use: No   Sexual activity: Yes  Other Topics Concern   Not on file  Social History Narrative   Married, disabled, unable to have children   Social Drivers of Corporate investment banker Strain: Low Risk  (01/14/2024)   Overall Financial Resource Strain (CARDIA)    Difficulty of Paying Living Expenses: Not hard at all  Food Insecurity: No Food Insecurity (01/14/2024)   Hunger Vital Sign    Worried About Running Out of Food in the Last Year: Never true    Ran Out of Food in the Last Year: Never true  Transportation Needs: No Transportation Needs (01/14/2024)   PRAPARE - Administrator, Civil Service (Medical): No    Lack of Transportation (Non-Medical): No  Physical Activity: Inactive (01/14/2024)   Exercise Vital Sign    Days of  Exercise per Week: 0 days    Minutes of Exercise per Session: 0 min  Stress: Stress Concern Present (01/14/2024)   [employer] of Occupational Health - Occupational Stress Questionnaire    Feeling of Stress: To some extent  Social Connections: Moderately Integrated (01/14/2024)   Social Connection and Isolation Panel    Frequency of Communication with Friends and Family: More than three times a week    Frequency of Social Gatherings with Friends and Family: Once a week    Attends Religious Services: Never    Database administrator or Organizations: Yes    Attends Banker Meetings: Never    Marital Status: Married  Catering manager Violence: Not At Risk (01/14/2024)   Humiliation, Afraid, Rape, and Kick questionnaire    Fear of Current or Ex-Partner: No    Emotionally Abused: No    Physically Abused: No    Sexually Abused: No    Outpatient Medications Prior to Visit  Medication Sig Dispense Refill   atenolol (TENORMIN) 25 MG tablet 1 tablet     AUVELITY 45-105 MG TBCR Take by mouth.     butalbital -acetaminophen -caffeine  (BAC) 50-325-40 MG tablet Take 1 tablet by mouth 3 (three) times daily as needed. 30 tablet 2   Cholecalciferol 50 MCG (2000 UT) CAPS Vitamin D3 50 mcg (2,000 unit) capsule   1 capsule every day by oral route.     clonazePAM (KLONOPIN) 1 MG tablet Take 1 mg by mouth 3 (three) times daily as needed.     clotrimazole -betamethasone  (LOTRISONE ) cream Apply 1 Application topically 2 (two) times daily. 30 g 0   fentaNYL  (DURAGESIC ) 50 MCG/HR Place 1 patch onto the skin every other day. 15 patch 0   fluticasone  (FLONASE ) 50 MCG/ACT nasal spray Place 2 sprays into both nostrils daily. 16 g 1   lamoTRIgine (LAMICTAL) 25 MG tablet Take 25 mg by mouth 2 (two) times daily. 1 in the morning and 1 in the evening     levothyroxine  (SYNTHROID ) 175 MCG tablet Take 1 tablet (175 mcg total) by mouth every morning on an empty stomach 6 days/week. 26 tablet 11   levothyroxine   (SYNTHROID ) 175 MCG tablet Take 1 tablet (175 mcg total) by mouth once daily on an empty stomach. 90 tablet 11   levothyroxine  (SYNTHROID ) 50 MCG tablet Take 1 tablet (50 mcg total) by mouth every morning on an empty stomach one day/week 90 days. 12 tablet 5   lisinopril (PRINIVIL,ZESTRIL) 20 MG tablet Take 1 tablet by mouth daily.     OVER THE COUNTER MEDICATION Take 1 capsule by mouth in the morning and at bedtime. AVOVIDA  oxyCODONE -acetaminophen  (PERCOCET/ROXICET) 5-325 MG tablet 1 tablet by mouth five times a day as needed for pain stop percocet 10/325 150 tablet 0   oxyCODONE -acetaminophen  (PERCOCET/ROXICET) 5-325 MG tablet Take 1 tablet by mouth 5 (five) times daily as needed for pain. 150 tablet 0   oxyCODONE -acetaminophen  (PERCOCET/ROXICET) 5-325 MG tablet Take 1 tablet by mouth 5 (five) times daily as needed for pain. Stop percocet 10/325 150 tablet 0   promethazine  (PHENERGAN ) 25 MG tablet Take 1 tablet (25 mg total) by mouth as needed. 90 tablet 0   tiZANidine  (ZANAFLEX ) 4 MG tablet Take 1 tablet (4 mg total) by mouth 3 (three) times daily as needed. 270 tablet 1   No facility-administered medications prior to visit.    Allergies  Allergen Reactions   Other Other (See Comments)    Oral steroids; skin sensitivity   Sulindac  Diarrhea and Other (See Comments)   Biaxin  [Clarithromycin ] Other (See Comments)    Bad taste and ringing in ear with dec hearing   Calcium -Containing Compounds Diarrhea   Clindamycin/Lincomycin Dermatitis   Ketorolac Tromethamine Other (See Comments)    migraine   Metoprolol Tartrate Other (See Comments)    syncope   Pb-Hyoscy-Atropine-Scopolamine Hives   Penicillins     REACTION: HIVES   Phenobarbital Hives   Sulfonamide Derivatives    Topiramate Other (See Comments)    Kidney stones   Vortioxetine Diarrhea and Other (See Comments)   Zaleplon Other (See Comments)    Muscle weakness    Review of Systems  Constitutional:  Negative for fever and  malaise/fatigue.  HENT:  Negative for congestion.   Eyes:  Negative for blurred vision.  Respiratory:  Negative for cough and shortness of breath.   Cardiovascular:  Negative for chest pain, palpitations and leg swelling.  Gastrointestinal:  Negative for abdominal pain, blood in stool, nausea and vomiting.  Genitourinary:  Negative for dysuria and frequency.  Musculoskeletal:  Positive for back pain. Negative for falls.  Skin:  Negative for rash.  Neurological:  Positive for focal weakness. Negative for dizziness, loss of consciousness and headaches.  Endo/Heme/Allergies:  Negative for environmental allergies.  Psychiatric/Behavioral:  Negative for depression. The patient is not nervous/anxious.        Objective:    Physical Exam Vitals and nursing note reviewed.  Constitutional:      General: She is not in acute distress.    Appearance: Normal appearance. She is well-developed.  HENT:     Head: Normocephalic and atraumatic.     Right Ear: Tympanic membrane, ear canal and external ear normal. There is no impacted cerumen.     Left Ear: Tympanic membrane, ear canal and external ear normal. There is no impacted cerumen.     Nose: Nose normal.     Mouth/Throat:     Mouth: Mucous membranes are moist.     Pharynx: Oropharynx is clear. No oropharyngeal exudate or posterior oropharyngeal erythema.  Eyes:     General: No scleral icterus.       Right eye: No discharge.        Left eye: No discharge.     Conjunctiva/sclera: Conjunctivae normal.     Pupils: Pupils are equal, round, and reactive to light.  Neck:     Thyroid : No thyromegaly or thyroid  tenderness.     Vascular: No JVD.  Cardiovascular:     Rate and Rhythm: Normal rate and regular rhythm.     Heart sounds: Normal heart sounds. No murmur heard. Pulmonary:  Effort: Pulmonary effort is normal. No respiratory distress.     Breath sounds: Normal breath sounds.  Abdominal:     General: Bowel sounds are normal. There is no  distension.     Palpations: Abdomen is soft. There is no mass.     Tenderness: There is no abdominal tenderness. There is no guarding or rebound.  Musculoskeletal:        General: Normal range of motion.     Cervical back: Normal range of motion and neck supple.     Right lower leg: No edema.     Left lower leg: No edema.  Lymphadenopathy:     Cervical: No cervical adenopathy.  Skin:    General: Skin is warm and dry.     Findings: No erythema or rash.  Neurological:     Mental Status: She is alert and oriented to person, place, and time.     Cranial Nerves: No cranial nerve deficit.     Deep Tendon Reflexes: Reflexes are normal and symmetric.  Psychiatric:        Mood and Affect: Mood normal.        Behavior: Behavior normal.        Thought Content: Thought content normal.        Judgment: Judgment normal.     BP 110/72 (BP Location: Left Arm, Patient Position: Sitting, Cuff Size: Large)   Pulse (!) 57   Temp 99 F (37.2 C) (Oral)   Resp 18   Ht 5' 5 (1.651 m)   Wt 213 lb 12.8 oz (97 kg)   SpO2 97%   BMI 35.58 kg/m  Wt Readings from Last 3 Encounters:  02/25/24 213 lb 12.8 oz (97 kg)  01/14/24 211 lb (95.7 kg)  07/23/23 218 lb 6.4 oz (99.1 kg)    Diabetic Foot Exam - Simple   No data filed    Lab Results  Component Value Date   WBC 9.3 11/08/2022   HGB 14.7 11/08/2022   HCT 45.2 11/08/2022   PLT 326.0 11/08/2022   GLUCOSE 93 11/08/2022   CHOL 306 (H) 11/08/2022   TRIG 339.0 (H) 11/08/2022   HDL 49.80 11/08/2022   LDLDIRECT 213.0 11/08/2022   LDLCALC 190 (H) 06/27/2015   ALT 20 11/08/2022   AST 21 11/08/2022   NA 139 11/08/2022   K 4.4 11/08/2022   CL 99 11/08/2022   CREATININE 1.19 11/08/2022   BUN 19 11/08/2022   CO2 30 11/08/2022   TSH 0.34 (L) 11/08/2022   HGBA1C 5.4 08/15/2007    Lab Results  Component Value Date   TSH 0.34 (L) 11/08/2022   Lab Results  Component Value Date   WBC 9.3 11/08/2022   HGB 14.7 11/08/2022   HCT 45.2  11/08/2022   MCV 86.6 11/08/2022   PLT 326.0 11/08/2022   Lab Results  Component Value Date   NA 139 11/08/2022   K 4.4 11/08/2022   CO2 30 11/08/2022   GLUCOSE 93 11/08/2022   BUN 19 11/08/2022   CREATININE 1.19 11/08/2022   BILITOT 0.3 11/08/2022   ALKPHOS 115 11/08/2022   AST 21 11/08/2022   ALT 20 11/08/2022   PROT 7.6 11/08/2022   ALBUMIN 4.5 11/08/2022   CALCIUM  9.9 11/08/2022   EGFR 71.0 06/29/2022   GFR 48.61 (L) 11/08/2022   Lab Results  Component Value Date   CHOL 306 (H) 11/08/2022   Lab Results  Component Value Date   HDL 49.80 11/08/2022   Lab Results  Component Value Date   LDLCALC 190 (H) 06/27/2015   Lab Results  Component Value Date   TRIG 339.0 (H) 11/08/2022   Lab Results  Component Value Date   CHOLHDL 6 11/08/2022   Lab Results  Component Value Date   HGBA1C 5.4 08/15/2007       Assessment & Plan:  Preventative health care Assessment & Plan: Ghm utd Check labs  See AVS Health Maintenance  Topic Date Due   Pneumococcal Vaccine: 50+ Years (1 of 1 - PCV) Never done   DEXA SCAN  11/10/2023   Influenza Vaccine  01/10/2024   Colonoscopy  01/13/2025 (Originally 12/10/2019)   Hepatitis C Screening  01/14/2025 (Originally 03[birth year]11/1977)   HIV Screening  01/14/2025 (Originally 03/11/1974)   Mammogram  12/31/2024   Medicare Annual Wellness (AWV)  01/13/2025   DTaP/Tdap/Td (3 - Td or Tdap) 07/05/2027   Zoster Vaccines- Shingrix  Completed   Hepatitis B Vaccines 19-59 Average Risk  Aged Out   HPV VACCINES  Aged Out   Meningococcal B Vaccine  Aged Out   COVID-19 Vaccine  Discontinued      Hypothyroidism, unspecified type Assessment & Plan: Check labs    Hypertriglyceridemia  Primary hypertension Assessment & Plan: Well controlled, no changes to meds. Encouraged heart healthy diet such as the DASH diet and exercise as tolerated.     Need for influenza vaccination -     Flu vaccine trivalent PF, 6mos and  older(Flulaval,Afluria,Fluarix,Fluzone)   Assessment and Plan Assessment & Plan Depression, unspecified   She feels down due to recent events and news, with no changes in family history related to depression.  Migraine, unspecified, not intractable, without status migrainosus   She experiences migraines triggered by heat and crying. Avoid known headache triggers.  Plantar fasciitis   Severe plantar fasciitis is present.  General Health Maintenance   Immunizations and routine screenings were discussed. She is due for a bone density scan and has scheduled it. She declined lab work for the physical exam but agreed to a flu shot, which will be administered today. Schedule a pneumonia vaccination at a later date. Follow up on eye exam results.   Sherika Kubicki R Lowne Chase, DO

## 2024-02-27 ENCOUNTER — Other Ambulatory Visit (HOSPITAL_BASED_OUTPATIENT_CLINIC_OR_DEPARTMENT_OTHER): Payer: Self-pay

## 2024-02-29 NOTE — Assessment & Plan Note (Signed)
 Well controlled, no changes to meds. Encouraged heart healthy diet such as the DASH diet and exercise as tolerated.

## 2024-02-29 NOTE — Assessment & Plan Note (Signed)
 Ghm utd Check labs  See AVS Health Maintenance  Topic Date Due   Pneumococcal Vaccine: 50+ Years (1 of 1 - PCV) Never done   DEXA SCAN  11/10/2023   Influenza Vaccine  01/10/2024   Colonoscopy  01/13/2025 (Originally 12/10/2019)   Hepatitis C Screening  01/14/2025 (Originally 03/11/1977)   HIV Screening  01/14/2025 (Originally 03/11/1974)   Mammogram  12/31/2024   Medicare Annual Wellness (AWV)  01/13/2025   DTaP/Tdap/Td (3 - Td or Tdap) 07/05/2027   Zoster Vaccines- Shingrix  Completed   Hepatitis B Vaccines 19-59 Average Risk  Aged Out   HPV VACCINES  Aged Out   Meningococcal B Vaccine  Aged Out   COVID-19 Vaccine  Discontinued

## 2024-02-29 NOTE — Assessment & Plan Note (Signed)
 Check labs

## 2024-03-11 ENCOUNTER — Other Ambulatory Visit (HOSPITAL_BASED_OUTPATIENT_CLINIC_OR_DEPARTMENT_OTHER): Payer: Self-pay

## 2024-03-11 DIAGNOSIS — M62838 Other muscle spasm: Secondary | ICD-10-CM | POA: Diagnosis not present

## 2024-03-11 DIAGNOSIS — G894 Chronic pain syndrome: Secondary | ICD-10-CM | POA: Diagnosis not present

## 2024-03-11 DIAGNOSIS — M47814 Spondylosis without myelopathy or radiculopathy, thoracic region: Secondary | ICD-10-CM | POA: Diagnosis not present

## 2024-03-11 DIAGNOSIS — Z79891 Long term (current) use of opiate analgesic: Secondary | ICD-10-CM | POA: Diagnosis not present

## 2024-03-11 MED ORDER — FENTANYL 50 MCG/HR TD PT72
1.0000 | MEDICATED_PATCH | TRANSDERMAL | 0 refills | Status: DC
  Filled 2024-03-27: qty 15, 30d supply, fill #0

## 2024-03-11 MED ORDER — OXYCODONE-ACETAMINOPHEN 5-325 MG PO TABS
1.0000 | ORAL_TABLET | Freq: Every day | ORAL | 0 refills | Status: DC | PRN
  Filled 2024-03-27: qty 150, 30d supply, fill #0

## 2024-03-13 ENCOUNTER — Other Ambulatory Visit (HOSPITAL_COMMUNITY): Payer: Self-pay

## 2024-03-27 ENCOUNTER — Other Ambulatory Visit (HOSPITAL_BASED_OUTPATIENT_CLINIC_OR_DEPARTMENT_OTHER): Payer: Self-pay

## 2024-03-27 ENCOUNTER — Other Ambulatory Visit: Payer: Self-pay

## 2024-03-31 ENCOUNTER — Ambulatory Visit (HOSPITAL_BASED_OUTPATIENT_CLINIC_OR_DEPARTMENT_OTHER)
Admission: RE | Admit: 2024-03-31 | Discharge: 2024-03-31 | Disposition: A | Discharge status: Home / Self Care | Source: Ambulatory Visit | Attending: Family Medicine | Admitting: Family Medicine

## 2024-03-31 DIAGNOSIS — M8588 Other specified disorders of bone density and structure, other site: Secondary | ICD-10-CM | POA: Diagnosis not present

## 2024-03-31 DIAGNOSIS — M8580 Other specified disorders of bone density and structure, unspecified site: Secondary | ICD-10-CM | POA: Insufficient documentation

## 2024-03-31 DIAGNOSIS — Z780 Asymptomatic menopausal state: Secondary | ICD-10-CM | POA: Insufficient documentation

## 2024-03-31 DIAGNOSIS — M8589 Other specified disorders of bone density and structure, multiple sites: Secondary | ICD-10-CM | POA: Diagnosis not present

## 2024-04-01 ENCOUNTER — Ambulatory Visit: Payer: Self-pay | Admitting: Family Medicine

## 2024-04-08 ENCOUNTER — Other Ambulatory Visit (HOSPITAL_BASED_OUTPATIENT_CLINIC_OR_DEPARTMENT_OTHER): Payer: Self-pay

## 2024-04-08 DIAGNOSIS — G894 Chronic pain syndrome: Secondary | ICD-10-CM | POA: Diagnosis not present

## 2024-04-08 DIAGNOSIS — M62838 Other muscle spasm: Secondary | ICD-10-CM | POA: Diagnosis not present

## 2024-04-08 DIAGNOSIS — M47814 Spondylosis without myelopathy or radiculopathy, thoracic region: Secondary | ICD-10-CM | POA: Diagnosis not present

## 2024-04-08 DIAGNOSIS — Z79891 Long term (current) use of opiate analgesic: Secondary | ICD-10-CM | POA: Diagnosis not present

## 2024-04-08 MED ORDER — TIZANIDINE HCL 4 MG PO TABS
4.0000 mg | ORAL_TABLET | Freq: Three times a day (TID) | ORAL | 1 refills | Status: AC | PRN
  Filled 2024-04-08: qty 270, 90d supply, fill #0
  Filled 2024-07-03: qty 270, 90d supply, fill #1

## 2024-04-08 MED ORDER — OXYCODONE-ACETAMINOPHEN 5-325 MG PO TABS
1.0000 | ORAL_TABLET | Freq: Every day | ORAL | 0 refills | Status: DC | PRN
  Filled 2024-04-08: qty 150, 32d supply, fill #0
  Filled 2024-04-27: qty 150, 30d supply, fill #0

## 2024-04-08 MED ORDER — FENTANYL 50 MCG/HR TD PT72
1.0000 | MEDICATED_PATCH | TRANSDERMAL | 0 refills | Status: DC
  Filled 2024-04-08 – 2024-04-27 (×2): qty 15, 30d supply, fill #0

## 2024-04-27 ENCOUNTER — Other Ambulatory Visit (HOSPITAL_BASED_OUTPATIENT_CLINIC_OR_DEPARTMENT_OTHER): Payer: Self-pay

## 2024-04-28 ENCOUNTER — Other Ambulatory Visit (HOSPITAL_BASED_OUTPATIENT_CLINIC_OR_DEPARTMENT_OTHER): Payer: Self-pay

## 2024-05-01 ENCOUNTER — Other Ambulatory Visit (HOSPITAL_COMMUNITY): Payer: Self-pay

## 2024-05-01 ENCOUNTER — Other Ambulatory Visit: Payer: Self-pay

## 2024-05-04 ENCOUNTER — Other Ambulatory Visit: Payer: Self-pay

## 2024-05-05 ENCOUNTER — Other Ambulatory Visit (HOSPITAL_BASED_OUTPATIENT_CLINIC_OR_DEPARTMENT_OTHER): Payer: Self-pay

## 2024-05-05 MED ORDER — FENTANYL 50 MCG/HR TD PT72
MEDICATED_PATCH | TRANSDERMAL | 0 refills | Status: DC
  Filled 2024-05-05 – 2024-05-29 (×2): qty 15, 30d supply, fill #0

## 2024-05-05 MED ORDER — BUTALBITAL-APAP-CAFFEINE 50-325-40 MG PO TABS
1.0000 | ORAL_TABLET | Freq: Three times a day (TID) | ORAL | 2 refills | Status: AC | PRN
  Filled 2024-05-05: qty 30, 10d supply, fill #0
  Filled 2024-07-14: qty 30, 10d supply, fill #1

## 2024-05-05 MED ORDER — OXYCODONE-ACETAMINOPHEN 5-325 MG PO TABS
1.0000 | ORAL_TABLET | Freq: Every day | ORAL | 0 refills | Status: AC | PRN
  Filled 2024-05-05: qty 150, 32d supply, fill #0
  Filled 2024-05-29: qty 150, 30d supply, fill #0

## 2024-05-06 ENCOUNTER — Other Ambulatory Visit (HOSPITAL_BASED_OUTPATIENT_CLINIC_OR_DEPARTMENT_OTHER): Payer: Self-pay

## 2024-05-12 ENCOUNTER — Other Ambulatory Visit (HOSPITAL_COMMUNITY): Payer: Self-pay

## 2024-05-29 ENCOUNTER — Other Ambulatory Visit (HOSPITAL_BASED_OUTPATIENT_CLINIC_OR_DEPARTMENT_OTHER): Payer: Self-pay

## 2024-06-01 ENCOUNTER — Other Ambulatory Visit (HOSPITAL_BASED_OUTPATIENT_CLINIC_OR_DEPARTMENT_OTHER): Payer: Self-pay

## 2024-06-02 ENCOUNTER — Other Ambulatory Visit (HOSPITAL_BASED_OUTPATIENT_CLINIC_OR_DEPARTMENT_OTHER): Payer: Self-pay

## 2024-06-02 MED ORDER — FENTANYL 50 MCG/HR TD PT72
MEDICATED_PATCH | TRANSDERMAL | 0 refills | Status: AC
  Filled 2024-06-02 – 2024-06-29 (×2): qty 15, 30d supply, fill #0

## 2024-06-02 MED ORDER — OXYCODONE-ACETAMINOPHEN 5-325 MG PO TABS
1.0000 | ORAL_TABLET | Freq: Every day | ORAL | 0 refills | Status: AC | PRN
  Filled 2024-06-02 – 2024-06-29 (×2): qty 150, 30d supply, fill #0

## 2024-06-03 ENCOUNTER — Other Ambulatory Visit (HOSPITAL_BASED_OUTPATIENT_CLINIC_OR_DEPARTMENT_OTHER): Payer: Self-pay

## 2024-06-29 ENCOUNTER — Other Ambulatory Visit (HOSPITAL_BASED_OUTPATIENT_CLINIC_OR_DEPARTMENT_OTHER): Payer: Self-pay

## 2024-06-29 ENCOUNTER — Other Ambulatory Visit (HOSPITAL_COMMUNITY): Payer: Self-pay

## 2024-06-30 ENCOUNTER — Telehealth: Payer: Self-pay | Admitting: Family Medicine

## 2024-06-30 ENCOUNTER — Other Ambulatory Visit (HOSPITAL_BASED_OUTPATIENT_CLINIC_OR_DEPARTMENT_OTHER): Payer: Self-pay

## 2024-06-30 ENCOUNTER — Other Ambulatory Visit: Payer: Self-pay | Admitting: Family Medicine

## 2024-06-30 DIAGNOSIS — E039 Hypothyroidism, unspecified: Secondary | ICD-10-CM

## 2024-06-30 DIAGNOSIS — G894 Chronic pain syndrome: Secondary | ICD-10-CM

## 2024-06-30 MED ORDER — FENTANYL 50 MCG/HR TD PT72
1.0000 | MEDICATED_PATCH | TRANSDERMAL | 0 refills | Status: AC
  Filled 2024-06-30: qty 15, 30d supply, fill #0

## 2024-06-30 MED ORDER — OXYCODONE-ACETAMINOPHEN 5-325 MG PO TABS: 1.0000 | ORAL_TABLET | ORAL | 0 refills | Status: AC

## 2024-06-30 NOTE — Telephone Encounter (Signed)
 Copied from CRM #[id number]. Topic: Referral - Question >> Jun 30, 2024  3:47 PM Revonda D wrote: Reason for CRM: Pt is requesting a referral for Dr.Bindubal Inman Mills, Endocrinologist. [address]  [place], [place] [address], phone: [phone]. Pt would like a callback with an update on the referral.

## 2024-06-30 NOTE — Telephone Encounter (Signed)
 Copied from CRM [id number]. Topic: Referral - Request for Referral >> Jun 30, 2024  2:43 PM Rea ORN wrote: Did the patient discuss referral with their provider in the last year? No (If No - schedule appointment) (If Yes - send message)  Appointment offered? No  Type of order/referral and detailed reason for visit:  Pt stated PCP is aware that she uses Pain Management. She stated this year her insurance is requiring and referral. Pt asking if a referral can be made without her being seen. She said pain management took over her headache medication.  Preference of office, provider, location: Dr. Oneil Chyle, [facility]  If referral order, have you been seen by this specialty before? Yes (If Yes, this issue or another issue? When? Where? Pt had appt with Dr. Orlando today and they advised that she needed a referral Can we respond through MyChart? No

## 2024-07-01 NOTE — Addendum Note (Signed)
 Addended by: ELOUISE POWELL HERO on: 07/01/2024 09:46 AM   Modules accepted: Orders

## 2024-07-01 NOTE — Telephone Encounter (Signed)
 Referral placed

## 2024-07-03 ENCOUNTER — Other Ambulatory Visit (HOSPITAL_BASED_OUTPATIENT_CLINIC_OR_DEPARTMENT_OTHER): Payer: Self-pay

## 2024-07-07 ENCOUNTER — Other Ambulatory Visit: Payer: Self-pay | Admitting: Family Medicine

## 2024-07-07 ENCOUNTER — Telehealth: Payer: Self-pay

## 2024-07-07 DIAGNOSIS — G894 Chronic pain syndrome: Secondary | ICD-10-CM

## 2024-07-07 NOTE — Telephone Encounter (Signed)
 Copied from CRM #[id number]. Topic: Referral - Status >> Jul 07, 2024  9:48 AM Harlene ORN wrote: Reason for CRM: Wants the referral to be sent to the pain doctor that she is already seeing. Does not want the referral that was alreayd made. Patient says that she has already sent the information on where she wants to go. It's the same doctor that she has been seeing for the past twenty years.  Referred: Doctor Oneil Ellen Address: [address], [place], [place] [address] Phone: [phone]

## 2024-07-31 ENCOUNTER — Ambulatory Visit: Admitting: Family Medicine

## 2025-01-14 ENCOUNTER — Ambulatory Visit
